# Patient Record
Sex: Female | Born: 1975
Health system: Southern US, Community
[De-identification: ages and names within clinical notes are randomized; demographics above are authoritative.]

## PROBLEM LIST (undated history)

## (undated) DIAGNOSIS — Z9889 Other specified postprocedural states: Secondary | ICD-10-CM

## (undated) DIAGNOSIS — T4145XA Adverse effect of unspecified anesthetic, initial encounter: Secondary | ICD-10-CM

## (undated) DIAGNOSIS — C50919 Malignant neoplasm of unspecified site of unspecified female breast: Secondary | ICD-10-CM

## (undated) DIAGNOSIS — R112 Nausea with vomiting, unspecified: Secondary | ICD-10-CM

## (undated) DIAGNOSIS — C50911 Malignant neoplasm of unspecified site of right female breast: Secondary | ICD-10-CM

## (undated) DIAGNOSIS — T8859XA Other complications of anesthesia, initial encounter: Secondary | ICD-10-CM

## (undated) DIAGNOSIS — Z9221 Personal history of antineoplastic chemotherapy: Secondary | ICD-10-CM

## (undated) DIAGNOSIS — M170 Bilateral primary osteoarthritis of knee: Secondary | ICD-10-CM

## (undated) DIAGNOSIS — Z1589 Genetic susceptibility to other disease: Secondary | ICD-10-CM

## (undated) DIAGNOSIS — Z8709 Personal history of other diseases of the respiratory system: Secondary | ICD-10-CM

## (undated) DIAGNOSIS — Z1509 Genetic susceptibility to other malignant neoplasm: Secondary | ICD-10-CM

## (undated) DIAGNOSIS — Z803 Family history of malignant neoplasm of breast: Secondary | ICD-10-CM

## (undated) DIAGNOSIS — Z1502 Genetic susceptibility to malignant neoplasm of ovary: Secondary | ICD-10-CM

## (undated) HISTORY — PX: TONSILECTOMY/ADENOIDECTOMY WITH MYRINGOTOMY: SHX6125

## (undated) HISTORY — DX: Family history of malignant neoplasm of breast: Z80.3

## (undated) HISTORY — DX: Genetic susceptibility to malignant neoplasm of ovary: Z15.02

## (undated) HISTORY — DX: Genetic susceptibility to other malignant neoplasm: Z15.09

## (undated) HISTORY — DX: Malignant neoplasm of unspecified site of unspecified female breast: C50.919

## (undated) HISTORY — PX: TUBAL LIGATION: SHX77

## (undated) HISTORY — PX: KNEE RECONSTRUCTION: SHX5883

## (undated) HISTORY — PX: TONSILLECTOMY: SUR1361

## (undated) HISTORY — DX: Genetic susceptibility to other disease: Z15.89

---

## 1998-12-25 ENCOUNTER — Ambulatory Visit (HOSPITAL_COMMUNITY): Admission: RE | Admit: 1998-12-25 | Discharge: 1998-12-25 | Payer: Self-pay | Admitting: *Deleted

## 1999-07-11 ENCOUNTER — Other Ambulatory Visit: Admission: RE | Admit: 1999-07-11 | Discharge: 1999-07-11 | Payer: Self-pay | Admitting: Obstetrics & Gynecology

## 2000-01-09 ENCOUNTER — Ambulatory Visit (HOSPITAL_COMMUNITY): Admission: RE | Admit: 2000-01-09 | Discharge: 2000-01-09 | Payer: Self-pay | Admitting: Obstetrics and Gynecology

## 2000-01-09 ENCOUNTER — Encounter: Payer: Self-pay | Admitting: Obstetrics and Gynecology

## 2000-02-27 ENCOUNTER — Inpatient Hospital Stay (HOSPITAL_COMMUNITY): Admission: AD | Admit: 2000-02-27 | Discharge: 2000-03-01 | Payer: Self-pay | Admitting: Obstetrics & Gynecology

## 2000-03-02 ENCOUNTER — Encounter: Admission: RE | Admit: 2000-03-02 | Discharge: 2000-05-31 | Payer: Self-pay | Admitting: Obstetrics and Gynecology

## 2000-06-02 ENCOUNTER — Encounter: Admission: RE | Admit: 2000-06-02 | Discharge: 2000-08-31 | Payer: Self-pay | Admitting: Obstetrics and Gynecology

## 2000-09-02 ENCOUNTER — Encounter: Admission: RE | Admit: 2000-09-02 | Discharge: 2000-10-31 | Payer: Self-pay | Admitting: Obstetrics and Gynecology

## 2000-11-30 ENCOUNTER — Encounter: Admission: RE | Admit: 2000-11-30 | Discharge: 2000-12-30 | Payer: Self-pay | Admitting: Obstetrics and Gynecology

## 2000-12-31 ENCOUNTER — Encounter: Admission: RE | Admit: 2000-12-31 | Discharge: 2001-01-30 | Payer: Self-pay | Admitting: Obstetrics and Gynecology

## 2001-07-26 ENCOUNTER — Other Ambulatory Visit: Admission: RE | Admit: 2001-07-26 | Discharge: 2001-07-26 | Payer: Self-pay | Admitting: Obstetrics and Gynecology

## 2001-11-26 ENCOUNTER — Inpatient Hospital Stay (HOSPITAL_COMMUNITY): Admission: RE | Admit: 2001-11-26 | Discharge: 2001-11-26 | Payer: Self-pay | Admitting: Obstetrics & Gynecology

## 2001-11-27 ENCOUNTER — Inpatient Hospital Stay (HOSPITAL_COMMUNITY): Admission: RE | Admit: 2001-11-27 | Discharge: 2001-11-27 | Payer: Self-pay | Admitting: Obstetrics & Gynecology

## 2002-06-22 ENCOUNTER — Other Ambulatory Visit: Admission: RE | Admit: 2002-06-22 | Discharge: 2002-06-22 | Payer: Self-pay | Admitting: Obstetrics and Gynecology

## 2002-10-12 ENCOUNTER — Inpatient Hospital Stay (HOSPITAL_COMMUNITY): Admission: AD | Admit: 2002-10-12 | Discharge: 2002-10-15 | Payer: Self-pay | Admitting: Obstetrics and Gynecology

## 2003-01-11 ENCOUNTER — Inpatient Hospital Stay (HOSPITAL_COMMUNITY): Admission: AD | Admit: 2003-01-11 | Discharge: 2003-01-14 | Payer: Self-pay | Admitting: Obstetrics and Gynecology

## 2003-01-15 ENCOUNTER — Encounter: Admission: RE | Admit: 2003-01-15 | Discharge: 2003-02-14 | Payer: Self-pay | Admitting: Obstetrics and Gynecology

## 2003-03-17 ENCOUNTER — Encounter: Admission: RE | Admit: 2003-03-17 | Discharge: 2003-04-16 | Payer: Self-pay | Admitting: Obstetrics and Gynecology

## 2003-04-17 ENCOUNTER — Encounter: Admission: RE | Admit: 2003-04-17 | Discharge: 2003-05-17 | Payer: Self-pay | Admitting: Obstetrics and Gynecology

## 2003-06-17 ENCOUNTER — Encounter: Admission: RE | Admit: 2003-06-17 | Discharge: 2003-07-17 | Payer: Self-pay | Admitting: Obstetrics and Gynecology

## 2003-08-17 ENCOUNTER — Encounter: Admission: RE | Admit: 2003-08-17 | Discharge: 2003-09-16 | Payer: Self-pay | Admitting: Obstetrics and Gynecology

## 2003-09-17 ENCOUNTER — Encounter: Admission: RE | Admit: 2003-09-17 | Discharge: 2003-10-17 | Payer: Self-pay | Admitting: Obstetrics and Gynecology

## 2003-11-15 ENCOUNTER — Encounter: Admission: RE | Admit: 2003-11-15 | Discharge: 2003-12-15 | Payer: Self-pay | Admitting: Obstetrics and Gynecology

## 2004-03-20 ENCOUNTER — Other Ambulatory Visit: Admission: RE | Admit: 2004-03-20 | Discharge: 2004-03-20 | Payer: Self-pay | Admitting: Obstetrics and Gynecology

## 2004-12-26 ENCOUNTER — Encounter (INDEPENDENT_AMBULATORY_CARE_PROVIDER_SITE_OTHER): Payer: Self-pay | Admitting: Specialist

## 2004-12-26 ENCOUNTER — Inpatient Hospital Stay (HOSPITAL_COMMUNITY): Admission: AD | Admit: 2004-12-26 | Discharge: 2004-12-28 | Payer: Self-pay | Admitting: Obstetrics and Gynecology

## 2004-12-29 ENCOUNTER — Encounter: Admission: RE | Admit: 2004-12-29 | Discharge: 2005-01-28 | Payer: Self-pay | Admitting: Obstetrics and Gynecology

## 2005-02-28 ENCOUNTER — Encounter: Admission: RE | Admit: 2005-02-28 | Discharge: 2005-03-30 | Payer: Self-pay | Admitting: Obstetrics and Gynecology

## 2005-03-31 ENCOUNTER — Encounter: Admission: RE | Admit: 2005-03-31 | Discharge: 2005-04-29 | Payer: Self-pay | Admitting: Obstetrics and Gynecology

## 2005-04-30 ENCOUNTER — Encounter: Admission: RE | Admit: 2005-04-30 | Discharge: 2005-05-30 | Payer: Self-pay | Admitting: Obstetrics and Gynecology

## 2005-05-31 ENCOUNTER — Encounter: Admission: RE | Admit: 2005-05-31 | Discharge: 2005-06-29 | Payer: Self-pay | Admitting: Obstetrics and Gynecology

## 2005-06-30 ENCOUNTER — Encounter: Admission: RE | Admit: 2005-06-30 | Discharge: 2005-07-30 | Payer: Self-pay | Admitting: Obstetrics and Gynecology

## 2005-07-31 ENCOUNTER — Encounter: Admission: RE | Admit: 2005-07-31 | Discharge: 2005-08-30 | Payer: Self-pay | Admitting: Obstetrics and Gynecology

## 2005-08-31 ENCOUNTER — Encounter: Admission: RE | Admit: 2005-08-31 | Discharge: 2005-09-27 | Payer: Self-pay | Admitting: Obstetrics and Gynecology

## 2005-09-28 ENCOUNTER — Encounter: Admission: RE | Admit: 2005-09-28 | Discharge: 2005-10-28 | Payer: Self-pay | Admitting: Obstetrics and Gynecology

## 2005-10-29 ENCOUNTER — Encounter: Admission: RE | Admit: 2005-10-29 | Discharge: 2005-11-27 | Payer: Self-pay | Admitting: Obstetrics and Gynecology

## 2005-11-18 ENCOUNTER — Encounter: Payer: Self-pay | Admitting: Obstetrics and Gynecology

## 2005-11-28 ENCOUNTER — Encounter: Admission: RE | Admit: 2005-11-28 | Discharge: 2005-12-28 | Payer: Self-pay | Admitting: Obstetrics and Gynecology

## 2005-12-29 ENCOUNTER — Encounter: Admission: RE | Admit: 2005-12-29 | Discharge: 2006-01-27 | Payer: Self-pay | Admitting: Obstetrics and Gynecology

## 2006-01-28 ENCOUNTER — Encounter: Admission: RE | Admit: 2006-01-28 | Discharge: 2006-02-05 | Payer: Self-pay | Admitting: Obstetrics and Gynecology

## 2009-01-29 ENCOUNTER — Ambulatory Visit (HOSPITAL_COMMUNITY): Admission: RE | Admit: 2009-01-29 | Discharge: 2009-01-29 | Payer: Self-pay | Admitting: Obstetrics and Gynecology

## 2009-02-22 ENCOUNTER — Inpatient Hospital Stay (HOSPITAL_COMMUNITY): Admission: AD | Admit: 2009-02-22 | Discharge: 2009-02-26 | Payer: Self-pay | Admitting: Obstetrics and Gynecology

## 2009-02-23 ENCOUNTER — Encounter (INDEPENDENT_AMBULATORY_CARE_PROVIDER_SITE_OTHER): Payer: Self-pay | Admitting: Obstetrics and Gynecology

## 2009-02-27 ENCOUNTER — Encounter: Admission: RE | Admit: 2009-02-27 | Discharge: 2009-03-29 | Payer: Self-pay | Admitting: Obstetrics and Gynecology

## 2009-03-07 ENCOUNTER — Ambulatory Visit (HOSPITAL_COMMUNITY): Admission: RE | Admit: 2009-03-07 | Discharge: 2009-03-07 | Payer: Self-pay | Admitting: Obstetrics and Gynecology

## 2009-03-30 ENCOUNTER — Encounter: Admission: RE | Admit: 2009-03-30 | Discharge: 2009-04-28 | Payer: Self-pay | Admitting: Obstetrics and Gynecology

## 2009-04-29 ENCOUNTER — Encounter: Admission: RE | Admit: 2009-04-29 | Discharge: 2009-05-29 | Payer: Self-pay | Admitting: Obstetrics and Gynecology

## 2009-05-30 ENCOUNTER — Encounter: Admission: RE | Admit: 2009-05-30 | Discharge: 2009-06-28 | Payer: Self-pay | Admitting: Obstetrics and Gynecology

## 2009-06-29 ENCOUNTER — Encounter: Admission: RE | Admit: 2009-06-29 | Discharge: 2009-07-18 | Payer: Self-pay | Admitting: Obstetrics and Gynecology

## 2009-07-30 ENCOUNTER — Encounter: Admission: RE | Admit: 2009-07-30 | Discharge: 2009-08-29 | Payer: Self-pay | Admitting: Obstetrics and Gynecology

## 2009-08-30 ENCOUNTER — Encounter: Admission: RE | Admit: 2009-08-30 | Discharge: 2009-09-29 | Payer: Self-pay | Admitting: Obstetrics and Gynecology

## 2009-10-28 ENCOUNTER — Encounter: Admission: RE | Admit: 2009-10-28 | Discharge: 2009-11-27 | Payer: Self-pay | Admitting: Obstetrics and Gynecology

## 2009-11-28 ENCOUNTER — Encounter: Admission: RE | Admit: 2009-11-28 | Discharge: 2009-12-28 | Payer: Self-pay | Admitting: Pediatrics

## 2009-12-29 ENCOUNTER — Encounter: Admission: RE | Admit: 2009-12-29 | Discharge: 2010-01-13 | Payer: Self-pay | Admitting: Obstetrics and Gynecology

## 2010-08-11 ENCOUNTER — Encounter: Payer: Self-pay | Admitting: Obstetrics and Gynecology

## 2010-10-25 LAB — CBC
HCT: 30.1 % — ABNORMAL LOW (ref 36.0–46.0)
MCHC: 33.8 g/dL (ref 30.0–36.0)
MCHC: 34.3 g/dL (ref 30.0–36.0)
MCV: 84.8 fL (ref 78.0–100.0)
Platelets: 208 10*3/uL (ref 150–400)
Platelets: 241 10*3/uL (ref 150–400)
RBC: 2.59 MIL/uL — ABNORMAL LOW (ref 3.87–5.11)
RDW: 15.6 % — ABNORMAL HIGH (ref 11.5–15.5)
RDW: 16 % — ABNORMAL HIGH (ref 11.5–15.5)
WBC: 7.5 10*3/uL (ref 4.0–10.5)

## 2010-10-25 LAB — DIFFERENTIAL
Lymphs Abs: 2.8 10*3/uL (ref 0.7–4.0)
Monocytes Relative: 6 % (ref 3–12)
Neutro Abs: 3.7 10*3/uL (ref 1.7–7.7)
Neutrophils Relative %: 50 % (ref 43–77)

## 2010-12-02 NOTE — Op Note (Signed)
Robin Miles, Robin Miles                   ACCOUNT NO.:  [id number]   MEDICAL RECORD NO.:  [id number]          PATIENT TYPE:  OUT   LOCATION:  MFM                           [facility]:  [facility]   PHYSICIAN:  Malva Limes, M.D.    DATE OF BIRTH:  [birth year]   DATE OF PROCEDURE:  02/24/2009  DATE OF DISCHARGE:                               OPERATIVE REPORT   PREOPERATIVE DIAGNOSIS:  The patient desires permanent sterilization.   POSTOPERATIVE DIAGNOSIS:  The patient desires permanent sterilization.   PROCEDURE:  Postpartum bilateral tubal ligation, Pomeroy procedure.   SURGEON:  Malva Limes, MD   ANESTHESIA:  Epidural and local.   ANTIBIOTICS:  Ancef 1g.   DRAINS:  Red rubber catheter into the bladder.   SPECIMENS:  Portion of right and left fallopian tube sent to Pathology.   COMPLICATIONS:  None.   ESTIMATED BLOOD LOSS:  Minimal.   DESCRIPTION OF PROCEDURE:  The patient was taken to the operating room,  where her epidural anesthetic was reinjected.  Once an adequate level  was reached, the patient was prepped and draped in the usual fashion for  this procedure.  Her bladder was drained with a red rubber catheter.  Her umbilicus was then injected with 0.25% bupivacaine.  A 2-cm  infraumbilical incision was made.  The fascia was grasped and entered  with Mayo scissors.  Parietal peritoneum was entered sharply.  Army-Navy  retractors were then placed.  The patient was placed in Trendelenburg.  The left fallopian tube was then grasped with a Babcock and carried to  the fimbriated end for identification.  Once this was accomplished, a 2-  cm knuckle in the isthmic portion of fallopian tube was doubly ligated  with plain gut suture.  The knuckle was excised.  Hemostasis appeared to  be adequate.  Both ostia were visualized.  A similar procedure was  performed on the opposite side.  At this point, the fascia and parietal  peritoneum were closed using 0 Monocryl suture in a running  fashion.  The skin was closed using 3-0 Vicryl in a subcuticular fashion.  The  patient tolerated the procedure well.  She was taken to recovery room in  stable condition.  Instrument and lap counts correct x1.           ______________________________  Malva Limes, M.D.    MA/MEDQ  D:  02/24/2009  T:  02/24/2009  Job:  [id number]

## 2010-12-02 NOTE — Discharge Summary (Signed)
Robin Miles, Robin Miles                   ACCOUNT NO.:  [id number]   MEDICAL RECORD NO.:  [id number]          PATIENT TYPE:  INP   LOCATION:  9139                          [facility]  [facility]   PHYSICIAN:  Leilani Able, P.A.-C.DATE OF BIRTH:  Jul 30, 1975   DATE OF ADMISSION:  02/22/2009  DATE OF DISCHARGE:  02/26/2009                               DISCHARGE SUMMARY   FINAL DIAGNOSES:  1. Twin gestation at 35-6/7th weeks.  2. Active labor.  3. Spontaneous vaginal delivery of twin A.  4. Vacuum extracted assisted delivery of twin B.   PROCEDURES:  Delivery performed by Dr. Malva Limes and postpartum  tubal sterilization also performed by Dr. Malva Limes.   COMPLICATIONS:  None.   HOSPITAL COURSE:  This 35 year old G4, P 1-2-0-4 presents with a known  twin pregnancy at 35-6/7th weeks in active labor.  The patient has had a  history of preterm labor.  Has been on 17-hydroxyprogesterone for this  pregnancy, but otherwise has not had any complications.  The patient  also desires postpartum tubal sterilization.  The patient had a slow  progression of labor.  Pitocin was used to help augment and amniotomy  was performed.  The patient was taken to the OR for delivery.  She had  spontaneous vaginal delivery of twin A, a 6 pounds 1 ounce female infant  with Apgars of 9 and 9.  This was her first degree midline tear.  Twin B  had amniotomy with clear fluids, pushed to about +2 station.  Fetal  heart tones dropped to the 110 mark.  At this point, a vacuum extractor  was placed and the patient delivered a 5 pounds 15 ounces female infant  with Apgars of 9 and 9.  The deliveries went without complications.  The  patient still expressed her desires for permanent sterilization.  She  was taken to the operating room on February 24, 2009, by Dr. Malva Limes  where postpartum bilateral tubal ligation was performed using the  Pomeroy procedure.  The procedure went without complications.  The  patient's  postoperative course was complicated by some postoperative  anemia.  The patient was felt ready for discharge on postpartum day #3,  which was postoperative day #2.  She was sent home on a regular diet,  told to decrease activities, told to continue her prenatal vitamins and  her iron supplement as directed, was told she could use ibuprofen up to  600 mg every 6 hours as needed for pain, and was to follow up in our  office in 4 weeks.  Instructions and precautions were reviewed with the  patient.   LABS ON DISCHARGE:  The patient had a hemoglobin of 7.6, white blood  cell count of 7.5, and platelets of 112,000.      Leilani Able, P.A.-C.     MB/MEDQ  D:  03/13/2009  T:  03/14/2009  Job:  [id number]

## 2011-08-13 ENCOUNTER — Other Ambulatory Visit: Payer: Self-pay | Admitting: Obstetrics and Gynecology

## 2012-02-17 ENCOUNTER — Other Ambulatory Visit: Payer: Self-pay

## 2012-11-20 ENCOUNTER — Emergency Department (HOSPITAL_COMMUNITY): Admission: EM | Admit: 2012-11-20 | Discharge: 2012-11-20 | Discharge status: Absent without leave | Payer: Self-pay

## 2014-07-20 DIAGNOSIS — C50919 Malignant neoplasm of unspecified site of unspecified female breast: Secondary | ICD-10-CM

## 2014-07-20 HISTORY — DX: Malignant neoplasm of unspecified site of unspecified female breast: C50.919

## 2014-10-25 ENCOUNTER — Other Ambulatory Visit: Payer: Self-pay | Admitting: Obstetrics and Gynecology

## 2014-10-26 ENCOUNTER — Other Ambulatory Visit: Payer: Self-pay | Admitting: Obstetrics and Gynecology

## 2014-10-26 DIAGNOSIS — R928 Other abnormal and inconclusive findings on diagnostic imaging of breast: Secondary | ICD-10-CM

## 2014-10-26 LAB — CYTOLOGY - PAP

## 2014-11-08 ENCOUNTER — Ambulatory Visit
Admission: RE | Admit: 2014-11-08 | Discharge: 2014-11-08 | Disposition: A | Discharge status: Home / Self Care | Payer: BLUE CROSS/BLUE SHIELD | Source: Ambulatory Visit | Attending: Obstetrics and Gynecology | Admitting: Obstetrics and Gynecology

## 2014-11-08 ENCOUNTER — Other Ambulatory Visit: Payer: Self-pay | Admitting: Obstetrics and Gynecology

## 2014-11-08 DIAGNOSIS — R928 Other abnormal and inconclusive findings on diagnostic imaging of breast: Secondary | ICD-10-CM

## 2014-11-13 ENCOUNTER — Other Ambulatory Visit: Payer: Self-pay | Admitting: Obstetrics and Gynecology

## 2014-11-13 DIAGNOSIS — R928 Other abnormal and inconclusive findings on diagnostic imaging of breast: Secondary | ICD-10-CM

## 2014-11-14 ENCOUNTER — Ambulatory Visit
Admission: RE | Admit: 2014-11-14 | Discharge: 2014-11-14 | Disposition: A | Discharge status: Home / Self Care | Payer: BLUE CROSS/BLUE SHIELD | Source: Ambulatory Visit | Attending: Obstetrics and Gynecology | Admitting: Obstetrics and Gynecology

## 2014-11-14 DIAGNOSIS — R928 Other abnormal and inconclusive findings on diagnostic imaging of breast: Secondary | ICD-10-CM

## 2014-11-16 ENCOUNTER — Other Ambulatory Visit: Payer: Self-pay | Admitting: Obstetrics and Gynecology

## 2014-11-16 DIAGNOSIS — D0511 Intraductal carcinoma in situ of right breast: Secondary | ICD-10-CM

## 2014-11-16 DIAGNOSIS — C50911 Malignant neoplasm of unspecified site of right female breast: Secondary | ICD-10-CM

## 2014-11-20 ENCOUNTER — Ambulatory Visit
Admission: RE | Admit: 2014-11-20 | Discharge: 2014-11-20 | Disposition: A | Discharge status: Home / Self Care | Payer: BLUE CROSS/BLUE SHIELD | Source: Ambulatory Visit | Attending: Obstetrics and Gynecology | Admitting: Obstetrics and Gynecology

## 2014-11-20 DIAGNOSIS — D0511 Intraductal carcinoma in situ of right breast: Secondary | ICD-10-CM

## 2014-11-20 DIAGNOSIS — C50911 Malignant neoplasm of unspecified site of right female breast: Secondary | ICD-10-CM

## 2014-11-20 MED ORDER — GADOBENATE DIMEGLUMINE 529 MG/ML IV SOLN: 9.0000 mL | Freq: Once | INTRAVENOUS | Status: AC | PRN

## 2014-11-23 ENCOUNTER — Telehealth: Payer: Self-pay | Admitting: *Deleted

## 2014-11-23 NOTE — Telephone Encounter (Signed)
Spoke with patient and confirmed new patient appointment for 345 for fin and 415 with Dr. Lindi Adie.  She is scheduled for genetics on 5/16 a t2pm.  She will pick up her packet when she checks in on Monday. Notified [facility] of her appointments. Contact information given and encouraged her to call with any needs or concerns.

## 2014-11-26 ENCOUNTER — Encounter: Payer: Self-pay | Admitting: Hematology and Oncology

## 2014-11-26 ENCOUNTER — Ambulatory Visit (HOSPITAL_BASED_OUTPATIENT_CLINIC_OR_DEPARTMENT_OTHER): Payer: BLUE CROSS/BLUE SHIELD | Admitting: Hematology and Oncology

## 2014-11-26 ENCOUNTER — Ambulatory Visit: Payer: BLUE CROSS/BLUE SHIELD

## 2014-11-26 ENCOUNTER — Other Ambulatory Visit: Payer: Self-pay | Admitting: *Deleted

## 2014-11-26 VITALS — BP 112/65 | HR 66 | Temp 97.8°F | Resp 18 | Ht 63.0 in | Wt 113.6 lb

## 2014-11-26 DIAGNOSIS — Z8041 Family history of malignant neoplasm of ovary: Secondary | ICD-10-CM

## 2014-11-26 DIAGNOSIS — C50111 Malignant neoplasm of central portion of right female breast: Secondary | ICD-10-CM | POA: Insufficient documentation

## 2014-11-26 DIAGNOSIS — Z803 Family history of malignant neoplasm of breast: Secondary | ICD-10-CM

## 2014-11-26 DIAGNOSIS — C50511 Malignant neoplasm of lower-outer quadrant of right female breast: Secondary | ICD-10-CM

## 2014-11-26 NOTE — Progress Notes (Signed)
[facility] CONSULT NOTE  Patient Care Team: Olga Millers, MD as PCP - General (Obstetrics and Gynecology)  CHIEF COMPLAINTS/PURPOSE OF CONSULTATION:  Newly diagnosed breast cancer  HISTORY OF PRESENTING ILLNESS:  Robin Miles 39 y.o. female is here because of recent diagnosis of  Right breast cancer. Patient had a routine screening mammogram that revealed abnormalities in the right breast at the 2 biopsy which revealed invasive ductal carcinoma with DCIS grade 2 that was ER/PR 0% HER-2 positive. She had a breast MRI that showed a 3.7 cm  Area of non-mass enhancement in the retroareolar position of the right breast extending towards the nipple. There were no enlarged lymph nodes. She was sent to [place] for discussion regarding neoadjuvant treatment options. 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 She denies any pain discomfort or any abnormalities of the breast.  She is extremely nervous and worried about her diagnosis.  I reviewed her records extensively and collaborated the history with the patient.  SUMMARY OF ONCOLOGIC HISTORY:   Cancer of central portion of right female breast   11/14/2014 Initial Diagnosis Right breast biopsy 8:30 position: Invasive ductal carcinoma with DCIS, grade 2, ER 0%, PR 0%, HER-2 positive ratio 3.53   11/20/2014 Breast MRI Right breast: 3.7 x 2.8 x 1.7 cm area of non-mass enhancement and retroareolar right breast into the lower outer quadrant and lower inner quadrant extending into the nipple involving anterior two thirds of the breast, no lymph nodes    In terms of breast cancer risk profile:  She menarched at early age of 53 She has 6 children ( 2 sets of twins) She was exposed to fertility medications or hormone replacement therapy.  She has family history of Breast/GYN/GI cancer  First cousin breast cancer, grandmother breast cancer 38, another cousin with ovarian cancer  MEDICAL HISTORY: No previous  medical illnesses  SURGICAL HISTORY: No surgeries SOCIAL HISTORY:denies tobacco, alcohol or recreational drugs Works in [employer] FAMILY HISTORY: GM breast cancer 51, cousin ovarian cancer, another cousin breast cancer 25  ALLERGIES:  has No Known Allergies.  MEDICATIONS: No meds REVIEW OF SYSTEMS:   Constitutional: Denies fevers, chills or abnormal night sweats Eyes: Denies blurriness of vision, double vision or watery eyes Ears, nose, mouth, throat, and face: Denies mucositis or sore throat Respiratory: Denies cough, dyspnea or wheezes Cardiovascular: Denies palpitation, chest discomfort or lower extremity swelling Gastrointestinal:  Denies nausea, heartburn or change in bowel habits Skin: Denies abnormal skin rashes Lymphatics: Denies new lymphadenopathy or easy bruising Neurological:Denies numbness, tingling or new weaknesses Behavioral/Psych: Mood is stable, no new changes  Breast:  Denies any palpable lumps or discharge All other systems were reviewed with the patient and are negative.  PHYSICAL EXAMINATION: ECOG PERFORMANCE STATUS: 0 - Asymptomatic  Filed Vitals:   11/26/14 1547  BP: 112/65  Pulse: 66  Temp: 97.8 F (36.6 C)  Resp: 18   Filed Weights   11/26/14 1547  Weight: 113 lb 9.6 oz (51.529 kg)    GENERAL:alert, no distress and comfortable SKIN: skin color, texture, turgor are normal, no rashes or significant lesions EYES: normal, conjunctiva are pink and non-injected, sclera clear OROPHARYNX:no exudate, no erythema and lips, buccal mucosa, and tongue normal  NECK: supple, thyroid normal size, non-tender, without nodularity LYMPH:  no palpable lymphadenopathy in the cervical, axillary or inguinal LUNGS: clear to auscultation and percussion with normal breathing effort HEART: regular rate & rhythm and no  murmurs and no lower extremity edema ABDOMEN:abdomen soft, non-tender and normal bowel sounds Musculoskeletal:no cyanosis of digits and no  clubbing  PSYCH: alert & oriented x 3 with fluent speech NEURO: no focal motor/sensory deficits BREAST: No palpable nodules in breast. No palpable axillary or supraclavicular lymphadenopathy (exam performed in the presence of a chaperone)   LABORATORY DATA:  I have reviewed the data as listed Lab Results  Component Value Date   WBC 7.5 02/26/2009   HGB * 02/26/2009    7.6 CRITICAL RESULT CALLED TO, READ BACK BY AND VERIFIED WITH: KIRKPATRICK,B ON 02/26/09 AT 0603 BY GARCONJ   HCT 22.0* 02/26/2009   MCV 84.8 02/26/2009   PLT 212 02/26/2009   No results found for: NA, K, CL, CO2  RADIOGRAPHIC STUDIES: I have personally reviewed the radiological reports and agreed with the findings in the report.  results are summarized as above  ASSESSMENT AND PLAN:  Cancer of central portion of right female breast Right breast retroareolar mass 3.7 x 2.8 x 1.7 cm involving anterior two thirds of the breast, T2 N0 M0 stage II a clinical stage, ER 0%, appears 0%, HER-2 positive ratio 3.53  Radiology and pathology review: Discussed with the patient, the details of pathology including the type of breast cancer,the clinical staging, the significance of ER, PR and HER-2/neu receptors and the implications for treatment. After reviewing the pathology in detail, we proceeded to discuss the different treatment options between surgery, radiation, chemotherapy, antiestrogen therapies.  Recommendation: 1. Neoadjuvant chemotherapy with [place] Perjeta 2. consultation with genetics 3.  followed by surgery and followed by adjuvant radiation if she chooses to undergo lumpectomy   Chemotherapy counseling:I have discussed the risks and benefits of chemotherapy including the risks of nausea/ vomiting, risk of infection from low WBC count, fatigue due to chemo or anemia, bruising or bleeding due to low platelets, mouth sores, loss/ change in taste and decreased appetite. Liver and kidney function will be monitored through out  chemotherapy as abnormalities in liver and kidney function may be a side effect of treatment. Cardiac dysfunction due to Herceptin and Perjeta were discussed in detail. Risk of permanent bone marrow dysfunction and leukemia due to chemo were also discussed.  Plan:  1. Port placement  2. Echocardiogram  3. Chemotherapy education  4. Start chemotherapy in 12/06/14  All questions were answered. The patient knows to call the clinic with any problems, questions or concerns.    Rulon Eisenmenger, MD 5:25 PM

## 2014-11-26 NOTE — Progress Notes (Signed)
Checked in new pt with no financial concerns prior to seeing the dr.  Informed pt if chemo is part of her treatment we will call her ins to see if Josem Kaufmann is req and will obtain it if it is as well as contact foundations that offer copay assistance for chemo if needed.  She has my card for any billing questions or concerns.

## 2014-11-26 NOTE — Assessment & Plan Note (Signed)
Right breast retroareolar mass 3.7 x 2.8 x 1.7 cm involving anterior two thirds of the breast, T2 N0 M0 stage II a clinical stage, ER 0%, appears 0%, HER-2 positive ratio 3.53  Radiology and pathology review: Discussed with the patient, the details of pathology including the type of breast cancer,the clinical staging, the significance of ER, PR and HER-2/neu receptors and the implications for treatment. After reviewing the pathology in detail, we proceeded to discuss the different treatment options between surgery, radiation, chemotherapy, antiestrogen therapies.  Recommendation: 1. Neoadjuvant chemotherapy with [place] Perjeta 2. consultation with genetics 3.  followed by surgery and followed by adjuvant radiation if she chooses to undergo lumpectomy   Chemotherapy counseling:I have discussed the risks and benefits of chemotherapy including the risks of nausea/ vomiting, risk of infection from low WBC count, fatigue due to chemo or anemia, bruising or bleeding due to low platelets, mouth sores, loss/ change in taste and decreased appetite. Liver and kidney function will be monitored through out chemotherapy as abnormalities in liver and kidney function may be a side effect of treatment. Cardiac dysfunction due to Herceptin and Perjeta were discussed in detail. Risk of permanent bone marrow dysfunction and leukemia due to chemo were also discussed.  Plan:  1. Port placement  2. Echocardiogram  3. Chemotherapy education  4. Start chemotherapy in 1-2 weeks

## 2014-11-27 ENCOUNTER — Encounter: Payer: Self-pay | Admitting: *Deleted

## 2014-11-27 ENCOUNTER — Telehealth: Payer: Self-pay | Admitting: Hematology and Oncology

## 2014-11-27 ENCOUNTER — Telehealth: Payer: Self-pay | Admitting: *Deleted

## 2014-11-27 ENCOUNTER — Encounter: Payer: Self-pay | Admitting: Radiation Oncology

## 2014-11-27 NOTE — Progress Notes (Addendum)
Location of Breast Cancer: right breast cancer at subareolar 8:30 position  Histology per Pathology Report:  11/14/14 Diagnosis Breast, right, needle core biopsy, subareolar 8:30 o'clock - INVASIVE DUCTAL CARCINOMA. - DUCTAL CARCINOMA IN SITU. - ASSOCIATED CALCIFICATION. - SEE COMMENT.  Receptor Status: ER(0%), PR (0%), Her2-neu (positive)  Did patient present with symptoms (if so, please note symptoms) or was this found on screening mammography?: routine mammogram  Past/Anticipated interventions by surgeon, if any: Will see Dr. Excell Seltzer tomorrow for a port a chat insertion.  Will have surgery after chemotherapy - most likely in September.  Past/Anticipated interventions by medical oncology, if any: Neoadjuvant chemotherapy with [place] to start 12/04/14  Lymphedema issues, if any:  no  Pain issues, if any:  no    OB GYN history: She menarched at 10. She has 6 children ( 2 sets of twins). She was exposed to fertility medications or hormone replacement therapy. She has family history of Breast/GYN/GI cancer First cousin breast cancer, grandmother breast cancer 81, another cousin with ovarian cancer   SAFETY ISSUES:  Prior radiation? no  Pacemaker/ICD? no  Possible current pregnancy?no  Is the patient on methotrexate? no  Current Complaints / other details:  Patient is here with her husband.  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  Patient is leaning towards having a mastectomy.  BP 110/66 mmHg  Pulse 68  Temp(Src) 98.2 F (36.8 C) (Oral)  Resp 16  Ht 5' 2.5" (1.588 m)  Wt 112 lb 4.8 oz (50.939 kg)  BMI 20.20 kg/m2  LMP 11/09/2014

## 2014-11-27 NOTE — Telephone Encounter (Signed)
spoke with patient and she is aware of all appointments

## 2014-11-27 NOTE — Telephone Encounter (Signed)
Per staff message and POF I have scheduled appts. Advised scheduler of appts. JMW  

## 2014-11-27 NOTE — Addendum Note (Signed)
Addended by: Renford Dills on: 11/27/2014 07:28 AM   Modules accepted: Medications

## 2014-11-28 ENCOUNTER — Encounter (HOSPITAL_BASED_OUTPATIENT_CLINIC_OR_DEPARTMENT_OTHER): Payer: Self-pay | Admitting: *Deleted

## 2014-11-29 ENCOUNTER — Ambulatory Visit
Admission: RE | Admit: 2014-11-29 | Discharge: 2014-11-29 | Disposition: A | Discharge status: Home / Self Care | Payer: BLUE CROSS/BLUE SHIELD | Source: Ambulatory Visit | Attending: Radiation Oncology | Admitting: Radiation Oncology

## 2014-11-29 ENCOUNTER — Encounter: Payer: Self-pay | Admitting: *Deleted

## 2014-11-29 ENCOUNTER — Other Ambulatory Visit: Payer: BLUE CROSS/BLUE SHIELD

## 2014-11-29 ENCOUNTER — Encounter: Payer: Self-pay | Admitting: Genetic Counselor

## 2014-11-29 ENCOUNTER — Ambulatory Visit: Payer: BLUE CROSS/BLUE SHIELD

## 2014-11-29 ENCOUNTER — Ambulatory Visit (HOSPITAL_BASED_OUTPATIENT_CLINIC_OR_DEPARTMENT_OTHER): Payer: BLUE CROSS/BLUE SHIELD | Admitting: Genetic Counselor

## 2014-11-29 ENCOUNTER — Encounter: Payer: Self-pay | Admitting: Radiation Oncology

## 2014-11-29 ENCOUNTER — Ambulatory Visit (HOSPITAL_COMMUNITY)
Admission: RE | Admit: 2014-11-29 | Discharge: 2014-11-29 | Disposition: A | Discharge status: Home / Self Care | Payer: BLUE CROSS/BLUE SHIELD | Source: Ambulatory Visit | Attending: Hematology and Oncology | Admitting: Hematology and Oncology

## 2014-11-29 ENCOUNTER — Other Ambulatory Visit: Payer: Self-pay | Admitting: *Deleted

## 2014-11-29 VITALS — BP 110/66 | HR 68 | Temp 98.2°F | Resp 16 | Ht 62.5 in | Wt 112.3 lb

## 2014-11-29 DIAGNOSIS — Z8041 Family history of malignant neoplasm of ovary: Secondary | ICD-10-CM | POA: Diagnosis not present

## 2014-11-29 DIAGNOSIS — Z803 Family history of malignant neoplasm of breast: Secondary | ICD-10-CM

## 2014-11-29 DIAGNOSIS — C50511 Malignant neoplasm of lower-outer quadrant of right female breast: Secondary | ICD-10-CM

## 2014-11-29 DIAGNOSIS — C50111 Malignant neoplasm of central portion of right female breast: Secondary | ICD-10-CM

## 2014-11-29 DIAGNOSIS — C50919 Malignant neoplasm of unspecified site of unspecified female breast: Secondary | ICD-10-CM | POA: Insufficient documentation

## 2014-11-29 DIAGNOSIS — Z315 Encounter for genetic counseling: Secondary | ICD-10-CM | POA: Diagnosis not present

## 2014-11-29 HISTORY — DX: Malignant neoplasm of unspecified site of right female breast: C50.911

## 2014-11-29 MED ORDER — ONDANSETRON HCL 8 MG PO TABS: 8.0000 mg | ORAL_TABLET | Freq: Two times a day (BID) | ORAL | Status: DC

## 2014-11-29 MED ORDER — LIDOCAINE-PRILOCAINE 2.5-2.5 % EX CREA: TOPICAL_CREAM | CUTANEOUS | Status: DC

## 2014-11-29 MED ORDER — PROCHLORPERAZINE MALEATE 10 MG PO TABS
10.0000 mg | ORAL_TABLET | Freq: Four times a day (QID) | ORAL | Status: DC | PRN
Start: 2014-11-29 — End: 2015-04-12

## 2014-11-29 MED ORDER — DEXAMETHASONE 4 MG PO TABS: 4.0000 mg | ORAL_TABLET | Freq: Two times a day (BID) | ORAL | Status: DC

## 2014-11-29 MED ORDER — LORAZEPAM 0.5 MG PO TABS: 0.5000 mg | ORAL_TABLET | Freq: Every day | ORAL | Status: DC

## 2014-11-29 MED ORDER — UNABLE TO FIND: 1.0000 | Freq: Once | Status: DC

## 2014-11-29 NOTE — Progress Notes (Signed)
REFERRING PROVIDER: Mark E Anderson, MD [address] [place], [place] [address]   Vinay Gudena, MD  PRIMARY PROVIDER:  ANDERSON,MARK E, MD  PRIMARY REASON FOR VISIT:  1. Cancer of central portion of right female breast   2. Family history of breast cancer      HISTORY OF PRESENT ILLNESS:   Robin Miles, a 38 y.o. female, was seen for a [place] cancer genetics consultation at the request of Dr. Anderson due to a personal and family history of cancer.  Robin Miles presents to clinic today to discuss the possibility of a hereditary predisposition to cancer, genetic testing, and to further clarify her future cancer risks, as well as potential cancer risks for family members.   In 2016, at the age of 38, Robin Miles was diagnosed with IDC and DCIS of the right breast. This will be treated with chemotherapy.  Robin Miles is scheduled for a port insertion tomorrow.  She states that she is considering having a double mastectomy, regardless of her genetic test results.    CANCER HISTORY:    Cancer of central portion of right female breast   11/14/2014 Initial Diagnosis Right breast biopsy 8:30 position: Invasive ductal carcinoma with DCIS, grade 2, ER 0%, PR 0%, HER-2 positive ratio 3.53   11/20/2014 Breast MRI Right breast: 3.7 x 2.8 x 1.7 cm area of non-mass enhancement and retroareolar right breast into the lower outer quadrant and lower inner quadrant extending into the nipple involving anterior two thirds of the breast, no lymph nodes     HORMONAL RISK FACTORS:  Menarche was at age 10.  First live birth at age 24.  OCP use for approximately 0 years.  Ovaries intact: yes.  Hysterectomy: no.  Menopausal status: premenopausal.  HRT use: 0 years. Colonoscopy: no; not examined. Mammogram within the last year: yes. Number of breast biopsies: 1. Up to date with pelvic exams:  yes. Any excessive radiation exposure in the past:  no  Past Medical History  Diagnosis Date  .  Breast cancer, right breast   . Breast cancer 2016    ER-/PR-/Her2+  . Family history of breast cancer     Past Surgical History  Procedure Laterality Date  . Tonsilectomy/adenoidectomy with myringotomy    . Tubal ligation    . Knee reconstruction Right   . Tonsillectomy      History   Social History  . Marital Status: Married    Spouse Name: N/A  . Number of Children: N/A  . Years of Education: N/A   Social History Main Topics  . Smoking status: Never Smoker   . Smokeless tobacco: Not on file  . Alcohol Use: No  . Drug Use: No  . Sexual Activity: Yes    Birth Control/ Protection: Surgical   Other Topics Concern  . None   Social History Narrative     FAMILY HISTORY:  We obtained a detailed, 4-generation family history.  Significant diagnoses are listed below: Family History  Problem Relation Age of Onset  . Breast cancer Maternal Aunt     dx in her late 60s  . Breast cancer Paternal Grandmother     dx in her 60s  . Breast cancer Cousin     dx late 40s; BRCA-  . Cancer Cousin     dx in her 40s; leiomyosarcoma   The patient has four daughters and two sons.  She also has a fraternal twin sister and a brother - all who   are cancer free.  Robin Miles mother is 13 and had a TAH, but she is unsure if a BSO was performed, in her 3s.  Her mother has 7 brothers and one sister.  Her sister had breast cancer in her 88s and had a daughter who had breast cancer in her late 70s.  Robin Miles cousin had genetic testing, and reportedly was negative for BRCA mutations.  Robin Miles has another cousin who was diagnosed and died of leimyosarcoma in her 64s.  Robin Miles father has one sister who is cancer free.  His mother had breast cancer in her 66s.  There is no other reported family history of cancer. Patient's ancestors are of Causasian descent. There is no reported Ashkenazi Jewish ancestry. There is no known consanguinity.  GENETIC COUNSELING ASSESSMENT: [name] is a 39 y.o.  female with a personal and family history of cancer which somewhat suggestive of a hereditary breast cancer syndrome and predisposition to cancer. We, therefore, discussed and recommended the following at today's visit.   DISCUSSION: We reviewed the characteristics, features and inheritance patterns of hereditary cancer syndromes. We discussed her family history of breast cancer and negative genetic testing in her cousin.  Her mother's family is very large, and there are many individuals who do not have cancer.  In light of the negative BRCA testing in her cousin, there is a reduced risk for finding a BRCA mutations, but we do not know if her cousin had panel testing of other hereditary genes, so there is a risk for those.  Robin Miles' paternal grandmother was diagnosed with breast cancer at a typical age of onset. However, there is a chance that there is a hereditary cancer syndrome being passed silently through Robin Miles father.  Therefore, genetic testing is warranted. We also discussed genetic testing, including the appropriate family members to test, the process of testing, insurance coverage and turn-around-time for results. We discussed the implications of a negative, positive and/or variant of uncertain significant result. We recommended Robin. Robin Miles pursue genetic testing for the Breast/Ovarian cancer gene panel. The Breast/Ovarian gene panel offered by [facility] includes sequencing and rearrangement analysis for the following 21 genes:  ATM, BARD1, BRCA1, BRCA2, BRIP1, CDH1, CHEK2, EPCAM, FANCC, MLH1, MSH2, MSH6, NBN, PALB2, PMS2, PTEN, RAD51C, RAD51D, STK11, TP53, and XRCC2.     Based on Robin. Blakley's personal and family history of cancer, she meets medical criteria for genetic testing. Despite that she meets criteria, she may still have an out of pocket cost. We discussed that if her out of pocket cost for testing is over $100, the laboratory will call and confirm whether she wants to proceed with testing.   If the out of pocket cost of testing is less than $100 she will be billed by the genetic testing laboratory.    PLAN: After considering the risks, benefits, and limitations, Robin. Lansdale  provided informed consent to pursue genetic testing and the blood sample was sent to [facility] for analysis of the Breast/Ovarian cancer gene panel. Results should be available within approximately 2-3 weeks' time, at which point they will be disclosed by telephone to Robin. Mukherjee, as will any additional recommendations warranted by these results. Robin. Headen will receive a summary of her genetic counseling visit and a copy of her results once available. This information will also be available in Epic. We encouraged Robin. Avis to remain in contact with cancer genetics annually so that we can continuously update the family history and inform  her of any changes in cancer genetics and testing that may be of benefit for her family. Robin. Baehr questions were answered to her satisfaction today. Our contact information was provided should additional questions or concerns arise.  Lastly, we encouraged Robin. Padovano to remain in contact with cancer genetics annually so that we can continuously update the family history and inform her of any changes in cancer genetics and testing that may be of benefit for this family.   Robin.  Skousen questions were answered to her satisfaction today. Our contact information was provided should additional questions or concerns arise. Thank you for the referral and allowing [place] to share in the care of your patient.   Karen P. Florene Glen, [place], [facility] Certified Genetic Counselor [email or web address] phone: [phone]  The patient was seen for a total of 60 minutes in face-to-face genetic counseling.  This patient was discussed with Drs. Magrinat, Lindi Adie and/or Burr Medico who agrees with the above.    _______________________________________________________________________ For Office Staff:  Number of  people involved in session: 2 Was an Intern/ student involved with case: no

## 2014-11-29 NOTE — Progress Notes (Signed)
  Echocardiogram 2D Echocardiogram has been performed.  Robin Miles 11/29/2014, 11:21 AM

## 2014-11-29 NOTE — Progress Notes (Signed)
Radiation Oncology         [phone] ________________________________  Initial outpatient Consultation  Name: Robin Miles MRN: [id number]  Date: 11/29/2014  DOB: [birth year]  EH:[id number],OIBB E, MD  Excell Seltzer, MD   REFERRING PHYSICIAN: Excell Seltzer, MD  DIAGNOSIS: Clinical stage II invasive ductal carcinoma of the right breast, central portion  HISTORY OF PRESENT ILLNESS::Robin Miles is a 39 y.o. female who is seen today at the courtesy of Dr. Excell Seltzer for an evaluation as it relates to the patient's recent diagnosis of invasive ductal carcinoma the right breast. Earlier this year the patient underwent screening mammography which was her second screening mammogram. This showed calcifications directly posterior to and slightly lateral to the nipple. This showed a segmental group of heterogeneous calcifications extending over 2.8 x 1.9 x 0.6 cm. At least one of these calcifications was immediately deep to the nipple area. Patient proceeded to undergo biopsy of this area which revealed invasive ductal carcinoma likely grade 2. The tumor was ER negative and PR negative with a proliferation marker of 29%. Tumor was HER-2/neu positive. She has met with medical oncology who is recommending consideration for neoadjuvant treatment.   Location of Breast Cancer: right breast cancer at subareolar 8:30 position  Histology per Pathology Report:  11/14/14 Diagnosis Breast, right, needle core biopsy, subareolar 8:30 o'clock - INVASIVE DUCTAL CARCINOMA. - DUCTAL CARCINOMA IN SITU. - ASSOCIATED CALCIFICATION. - SEE COMMENT.  Receptor Status: ER(0%), PR (0%), Her2-neu (positive)  Did patient present with symptoms (if so, please note symptoms) or was this found on screening mammography?: routine mammogram  Past/Anticipated interventions by surgeon, if any: Will see Dr. Excell Seltzer tomorrow for a port a chat insertion.  Will have surgery after chemotherapy - most likely in  September.  Past/Anticipated interventions by medical oncology, if any: Neoadjuvant chemotherapy with Heil to start 12/04/14  Lymphedema issues, if any:  no  Pain issues, if any:  no    OB GYN history: She menarched at 10. She has 6 children ( 2 sets of twins). She was exposed to fertility medications or hormone replacement therapy. She has family history of Breast/GYN/GI cancer First cousin breast cancer, grandmother breast cancer 67, another cousin with ovarian cancer   SAFETY ISSUES:  Prior radiation? no  Pacemaker/ICD? no  Possible current pregnancy?no  Is the patient on methotrexate? no  Current Complaints / other details:  Patient is here with her husband.  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  Patient is leaning towards having a mastectomy.      PREVIOUS RADIATION THERAPY: No  PAST MEDICAL HISTORY:  has a past medical history of Breast cancer, right breast; Breast cancer (2016); and Family history of breast cancer.    PAST SURGICAL HISTORY: Past Surgical History  Procedure Laterality Date  . Tonsilectomy/adenoidectomy with myringotomy    . Tubal ligation    . Knee reconstruction Right   . Tonsillectomy      FAMILY HISTORY: family history includes Breast cancer in her cousin, maternal aunt, and paternal grandmother; Cancer in her cousin.  SOCIAL HISTORY:  reports that she has never smoked. She has never used smokeless tobacco. She reports that she does not drink alcohol or use illicit drugs.  ALLERGIES: Review of patient's allergies indicates no known allergies.  MEDICATIONS:  Current Outpatient Prescriptions  Medication Sig Dispense Refill  . dexamethasone (DECADRON) 4 MG tablet Take 1 tablet (4 mg total) by mouth 2 (two) times daily. Start the day before  Taxotere. Then again the day after chemo for 3 days. 30 tablet 1  . lidocaine-prilocaine (EMLA) cream Apply to affected area once 30 g 3  . lidocaine-prilocaine (EMLA) cream Apply to affected  area once 30 g 3  . LORazepam (ATIVAN) 0.5 MG tablet Take 1 tablet (0.5 mg total) by mouth at bedtime. 30 tablet 0  . ondansetron (ZOFRAN) 8 MG tablet Take 1 tablet (8 mg total) by mouth 2 (two) times daily. Start the day after chemo for 3 days. Then take as needed for nausea or vomiting. 30 tablet 1  . prochlorperazine (COMPAZINE) 10 MG tablet Take 1 tablet (10 mg total) by mouth every 6 (six) hours as needed (Nausea or vomiting). 30 tablet 1   No current facility-administered medications for this encounter.    REVIEW OF SYSTEMS:  A 15 point review of systems is documented in the electronic medical record. This was obtained by the nursing staff. However, I reviewed this with the patient to discuss relevant findings and make appropriate changes.  No reports of nipple discharge or pain prior to diagnosis. Patient denies any new bony pain headaches dizziness or blurred vision.   PHYSICAL EXAM:  height is 5' 2.5" (1.588 m) and weight is 112 lb 4.8 oz (50.939 kg). Her oral temperature is 98.2 F (36.8 C). Her blood pressure is 110/66 and her pulse is 68. Her respiration is 16.    Lungs are clear. Heart has regular rate and rhythm. No palpable cervical, supraclavicular, or axillary adenopathy. Examination of the left breast reveals no mass or nipple discharge. Examination of the right breast reveals some mild bruising centrally. There is some thickening noted in the lower outer quadrant adjacent to the nipple area, likely related to biopsy changes   ECOG = 0  LABORATORY DATA:  Lab Results  Component Value Date   WBC 7.5 02/26/2009   HGB * 02/26/2009    7.6 CRITICAL RESULT CALLED TO, READ BACK BY AND VERIFIED WITH: KIRKPATRICK,B ON 02/26/09 AT 0603 BY GARCONJ   HCT 22.0* 02/26/2009   MCV 84.8 02/26/2009   PLT 212 02/26/2009   NEUTROABS 3.7 02/26/2009   No results found for: NA, K, CL, CO2, GLUCOSE, CREATININE, CALCIUM    RADIOGRAPHY: Mr Breast Bilateral W Wo Contrast  11/20/2014   CLINICAL  DATA:  Recently diagnosed right breast invasive ductal carcinoma and ductal carcinoma in situ with associated calcifications. Family history of breast cancer including a paternal grandmother at age 80 and maternal first cousin at age 41.  LABS:  None obtained today.  EXAM: BILATERAL BREAST MRI WITH AND WITHOUT CONTRAST  TECHNIQUE: Multiplanar, multisequence MR images of both breasts were obtained prior to and following the intravenous administration of 9 ml of MultiHance.  THREE-DIMENSIONAL MR IMAGE RENDERING ON INDEPENDENT WORKSTATION:  Three-dimensional MR images were rendered by post-processing of the original MR data on an independent workstation. The three-dimensional MR images were interpreted, and findings are reported in the following complete MRI report for this study. Three dimensional images were evaluated at the independent DynaCad workstation  COMPARISON:  Recent mammogram and biopsy examinations.  FINDINGS: Breast composition: c. Heterogeneous fibroglandular tissue.  Background parenchymal enhancement: Moderate.  Right breast: 3.7 x 2.8 x 1.7 cm area of non mass enhancement in the retroareolar right breast extending into the lower outer quadrant and, to a lesser degree, into the lower inner quadrant. This also extends into the nipple and is involving the anterior 2/3 of the breast. There are post biopsy changes with  a biopsy marker clip artifact within this area in the lower outer quadrant. The area of enhancement includes the recently demonstrated mammographic calcifications.  Left breast: No mass or abnormal enhancement.  Lymph nodes: No abnormal appearing lymph nodes.  Ancillary findings:  None.  IMPRESSION: 1. 3.7 x 2.8 x 1.7 cm area of biopsy-proven invasive ductal carcinoma and ductal carcinoma in situ in the retroareolar right breast, extending into the lower outer and, to a lesser degree, into the lower inner quadrants of the breast. This is involving the right nipple. 2. No adenopathy and no  evidence of malignancy on the left.  RECOMMENDATION: Treatment plan.  BI-RADS CATEGORY  6: Known biopsy-proven malignancy.   Electronically Signed   By: Claudie Revering M.D.   On: 11/20/2014 13:15   Mm Digital Diagnostic Unilat R  11/14/2014   CLINICAL DATA:  Post right breast stereotactic biopsy.  EXAM: DIAGNOSTIC RIGHT MAMMOGRAM POST STEREOTACTIC BIOPSY  COMPARISON:  Previous exam(s).  FINDINGS: Mammographic images were obtained following stereotactic guided biopsy of calcifications within the subareolar portion of the right breast at the 8:30 o'clock position. The coil shaped clip is in appropriate position.  IMPRESSION: Appropriate positioning of clip following right breast stereotactic biopsy.  Final Assessment: Post Procedure Mammograms for Marker Placement   Electronically Signed   By: Altamese Cabal M.D.   On: 11/14/2014 11:27   Mm Digital Diagnostic Unilat R  11/08/2014   CLINICAL DATA:  Calcifications right breast identified on recent baseline screening mammogram. The patient is 39 years old  EXAM: DIGITAL DIAGNOSTIC RIGHT MAMMOGRAM  COMPARISON:  10/25/2014  ACR Breast Density Category c: The breast tissue is heterogeneously dense, which may obscure small masses.  FINDINGS: Magnification views of the retroareolar right breast, directly posterior to and slightly lateral to nipple show a segmental group of heterogeneous calcifications. Calcifications span 2.8 cm anterior posterior x 1.9 cm transverse x 0.6 cm craniocaudal. It is noted that at least one of the calcifications is immediately deep to the nipple. No associated mass or distortion.  IMPRESSION: Suspicious calcifications right breast. Findings are concerning for ductal carcinoma in situ.  RECOMMENDATION: Attempt at stereotactic biopsy of right breast calcifications is recommended. Nine discussed with the patient today that we will attempt a stereotactic biopsy, but it may be prohibited by the thickness of the patient's breast when compressed.  She has been scheduled for attempt at stereotactic biopsy next week.  I have discussed the findings and recommendations with the patient. Results were also provided in writing at the conclusion of the visit. If applicable, a reminder letter will be sent to the patient regarding the next appointment.  BI-RADS CATEGORY  4: Suspicious.   Electronically Signed   By: Curlene Dolphin M.D.   On: 11/08/2014 11:19   Mm Rt Breast Bx W Loc Dev 1st Lesion Image Bx Spec Stereo Guide  11/20/2014   ADDENDUM REPORT: 11/19/2014 15:37  ADDENDUM: Pathology revealed right breast invasive ductal carcinoma and ductal carcinoma in situ with associated calcifications. This was found to be concordant by Dr. Abelardo Diesel. Pathology results were discussed with the patient via telephone. The patient reported tenderness at the biopsy site. Post biopsy instructions were reviewed and questions were answered. The patient was encouraged to call [facility] with any additional questions and or concerns. A surgical consultation was made with Dr. Adonis Housekeeper of [facility] on Nov 23, 2014.  Pathology results reported by Terie Purser RN on Nov 19, 2014.  Electronically Signed   By: Altamese Cabal M.D.   On: 11/19/2014 15:37   11/20/2014   CLINICAL DATA:  Right breast calcifications.  EXAM: RIGHT BREAST STEREOTACTIC CORE NEEDLE BIOPSY  COMPARISON:  Previous exams.  FINDINGS: The patient and I discussed the procedure of stereotactic-guided biopsy including benefits and alternatives. We discussed the high likelihood of a successful procedure. We discussed the risks of the procedure including infection, bleeding, tissue injury, clip migration, and inadequate sampling. Informed written consent was given. The usual time out protocol was performed immediately prior to the procedure.  Using sterile technique and 2% lidocaine as local anesthetic, under stereotactic guidance, a 9 gauge vacuum assisted Eviva petite  device was used to perform core needle biopsy of calcifications located within the subareolar right breast at the 8:30 o'clock position using a lateral approach. Specimen radiograph was performed showing representative calcifications within the specimen. Specimens with calcifications are identified for pathology.  At the conclusion of the procedure, a coil shaped tissue marker clip was deployed into the biopsy cavity. Follow-up 2-view mammogram was performed and dictated separately.  IMPRESSION: Stereotactic-guided biopsy of right breast calcifications as discussed above. No apparent complications.  Electronically Signed: By: Altamese Cabal M.D. On: 11/14/2014 11:26      IMPRESSION:  Clinical stage II invasive ductal carcinoma of the right breast. Based on MRI the patient has rather large lesion as it relates to her breast size and would likely benefit from neoadjuvant chemotherapy as planned.  I discussed the options for local regional management today with the patient and her husband. We discussed lumpectomy and radiation versus mastectomy. Also discussed the patient may require a central mastectomy if she proceeds with breast conservation therapy given the location of her lesion. At This time the patient is undecided which treatment approach she would like to potentially proceed with and we will await results of her genetic testing prior to any decision making concerning this issue.   PLAN: Patient will proceed with neoadjuvant chemotherapy in the near future.     This document serves as a record of services personally performed by Gery Pray, MD. It was created on his behalf by Jeralene Peters, a trained medical scribe. The creation of this record is based on the scribe's personal observations and the provider's statements to them. This document has been checked and approved by the attending provider.       ------------------------------------------------  Blair Promise, PhD, MD

## 2014-11-29 NOTE — Progress Notes (Signed)
Please see the Nurse Progress Note in the MD Initial Consult Encounter for this patient. 

## 2014-11-29 NOTE — Progress Notes (Signed)
Patient and husband at Chemo class this am.  Discussed side effects of Carbo, Taxotere and Herceptin and Perjeta including but not limited to peripheral neuropathy, myelosuppression, nausea and vomiting, dietary concerns and portacath specifics.  Teach back done.  Questions answered

## 2014-11-30 ENCOUNTER — Ambulatory Visit (HOSPITAL_BASED_OUTPATIENT_CLINIC_OR_DEPARTMENT_OTHER): Payer: BLUE CROSS/BLUE SHIELD | Admitting: Anesthesiology

## 2014-11-30 ENCOUNTER — Ambulatory Visit (HOSPITAL_BASED_OUTPATIENT_CLINIC_OR_DEPARTMENT_OTHER)
Admission: RE | Admit: 2014-11-30 | Discharge: 2014-11-30 | Disposition: A | Discharge status: Home / Self Care | Payer: BLUE CROSS/BLUE SHIELD | Source: Ambulatory Visit | Attending: General Surgery | Admitting: General Surgery

## 2014-11-30 ENCOUNTER — Encounter (HOSPITAL_BASED_OUTPATIENT_CLINIC_OR_DEPARTMENT_OTHER): Payer: Self-pay | Admitting: Anesthesiology

## 2014-11-30 ENCOUNTER — Ambulatory Visit (HOSPITAL_COMMUNITY): Payer: BLUE CROSS/BLUE SHIELD

## 2014-11-30 ENCOUNTER — Encounter (HOSPITAL_BASED_OUTPATIENT_CLINIC_OR_DEPARTMENT_OTHER)
Admission: RE | Disposition: A | Discharge status: Home / Self Care | Payer: Self-pay | Source: Ambulatory Visit | Attending: General Surgery

## 2014-11-30 DIAGNOSIS — C50911 Malignant neoplasm of unspecified site of right female breast: Secondary | ICD-10-CM | POA: Insufficient documentation

## 2014-11-30 DIAGNOSIS — Z419 Encounter for procedure for purposes other than remedying health state, unspecified: Secondary | ICD-10-CM

## 2014-11-30 HISTORY — PX: PORTACATH PLACEMENT: SHX2246

## 2014-11-30 LAB — POCT HEMOGLOBIN-HEMACUE: Hemoglobin: 13.7 g/dL (ref 12.0–15.0)

## 2014-11-30 SURGERY — INSERTION, TUNNELED CENTRAL VENOUS DEVICE, WITH PORT
Anesthesia: General | Site: Chest

## 2014-11-30 MED ORDER — GLYCOPYRROLATE 0.2 MG/ML IJ SOLN: 0.2000 mg | Freq: Once | INTRAMUSCULAR | Status: DC | PRN

## 2014-11-30 MED ORDER — HEPARIN SOD (PORK) LOCK FLUSH 100 UNIT/ML IV SOLN
INTRAVENOUS | Status: DC | PRN
  Administered 2014-11-30: 500 [IU] via INTRAVENOUS
  Administered 2014-11-30: 20 [IU] via INTRAVENOUS

## 2014-11-30 MED ORDER — OXYCODONE HCL 5 MG PO TABS
ORAL_TABLET | ORAL | Status: AC
  Filled 2014-11-30: qty 1

## 2014-11-30 MED ORDER — LIDOCAINE HCL (CARDIAC) 20 MG/ML IV SOLN
INTRAVENOUS | Status: DC | PRN
  Administered 2014-11-30: 10 mg via INTRAVENOUS

## 2014-11-30 MED ORDER — SODIUM BICARBONATE 4 % IV SOLN
INTRAVENOUS | Status: DC | PRN
  Administered 2014-11-30: 5 mL via INTRAVENOUS

## 2014-11-30 MED ORDER — PROPOFOL INFUSION 10 MG/ML OPTIME
INTRAVENOUS | Status: DC | PRN
  Administered 2014-11-30: 25 ug/kg/min via INTRAVENOUS

## 2014-11-30 MED ORDER — BUPIVACAINE-EPINEPHRINE 0.25% -1:200000 IJ SOLN
INTRAMUSCULAR | Status: DC | PRN
  Administered 2014-11-30: 30 mL

## 2014-11-30 MED ORDER — HEPARIN SOD (PORK) LOCK FLUSH 100 UNIT/ML IV SOLN
INTRAVENOUS | Status: AC
  Filled 2014-11-30: qty 5

## 2014-11-30 MED ORDER — FENTANYL CITRATE (PF) 100 MCG/2ML IJ SOLN: 50.0000 ug | INTRAMUSCULAR | Status: DC | PRN

## 2014-11-30 MED ORDER — FENTANYL CITRATE (PF) 100 MCG/2ML IJ SOLN: 25.0000 ug | INTRAMUSCULAR | Status: DC | PRN

## 2014-11-30 MED ORDER — LIDOCAINE HCL (PF) 1 % IJ SOLN
INTRAMUSCULAR | Status: DC | PRN
  Administered 2014-11-30: 30 mL

## 2014-11-30 MED ORDER — HYDROCODONE-ACETAMINOPHEN 5-325 MG PO TABS: 1.0000 | ORAL_TABLET | ORAL | Status: DC | PRN

## 2014-11-30 MED ORDER — BUPIVACAINE-EPINEPHRINE (PF) 0.5% -1:200000 IJ SOLN
INTRAMUSCULAR | Status: AC
  Filled 2014-11-30: qty 30

## 2014-11-30 MED ORDER — ONDANSETRON HCL 4 MG/2ML IJ SOLN
INTRAMUSCULAR | Status: DC | PRN
  Administered 2014-11-30: 4 mg via INTRAVENOUS

## 2014-11-30 MED ORDER — LIDOCAINE HCL (PF) 1 % IJ SOLN
INTRAMUSCULAR | Status: AC
  Filled 2014-11-30: qty 30

## 2014-11-30 MED ORDER — MIDAZOLAM HCL 5 MG/5ML IJ SOLN
INTRAMUSCULAR | Status: DC | PRN
  Administered 2014-11-30 (×2): 1 mg via INTRAVENOUS

## 2014-11-30 MED ORDER — LACTATED RINGERS IV SOLN
INTRAVENOUS | Status: DC
  Administered 2014-11-30 (×2): via INTRAVENOUS

## 2014-11-30 MED ORDER — MIDAZOLAM HCL 2 MG/2ML IJ SOLN: 1.0000 mg | INTRAMUSCULAR | Status: DC | PRN

## 2014-11-30 MED ORDER — MEPERIDINE HCL 25 MG/ML IJ SOLN: 6.2500 mg | INTRAMUSCULAR | Status: DC | PRN

## 2014-11-30 MED ORDER — MIDAZOLAM HCL 2 MG/2ML IJ SOLN
INTRAMUSCULAR | Status: AC
  Filled 2014-11-30: qty 2

## 2014-11-30 MED ORDER — OXYCODONE HCL 5 MG PO TABS
5.0000 mg | ORAL_TABLET | Freq: Once | ORAL | Status: AC
  Administered 2014-11-30: 5 mg via ORAL

## 2014-11-30 MED ORDER — FENTANYL CITRATE (PF) 100 MCG/2ML IJ SOLN
INTRAMUSCULAR | Status: DC | PRN
  Administered 2014-11-30 (×2): 50 ug via INTRAVENOUS

## 2014-11-30 MED ORDER — BUPIVACAINE-EPINEPHRINE (PF) 0.25% -1:200000 IJ SOLN
INTRAMUSCULAR | Status: AC
  Filled 2014-11-30: qty 30

## 2014-11-30 MED ORDER — HEPARIN (PORCINE) IN NACL 2-0.9 UNIT/ML-% IJ SOLN
INTRAMUSCULAR | Status: AC
  Filled 2014-11-30: qty 500

## 2014-11-30 MED ORDER — CEFAZOLIN SODIUM-DEXTROSE 2-3 GM-% IV SOLR
INTRAVENOUS | Status: DC | PRN
  Administered 2014-11-30: 2 g via INTRAVENOUS

## 2014-11-30 MED ORDER — FENTANYL CITRATE (PF) 100 MCG/2ML IJ SOLN
INTRAMUSCULAR | Status: AC
Start: 2014-11-30 — End: 2014-11-30
  Filled 2014-11-30: qty 6

## 2014-11-30 MED ORDER — PROPOFOL INFUSION 10 MG/ML OPTIME: INTRAVENOUS | Status: DC | PRN

## 2014-11-30 MED ORDER — CEFAZOLIN SODIUM-DEXTROSE 2-3 GM-% IV SOLR
INTRAVENOUS | Status: AC
  Filled 2014-11-30: qty 50

## 2014-11-30 SURGICAL SUPPLY — 39 items
BAG DECANTER FOR FLEXI CONT (MISCELLANEOUS) ×2 IMPLANT
BANDAGE ADH SHEER 1  50/CT (GAUZE/BANDAGES/DRESSINGS) ×2 IMPLANT
BLADE SURG 15 STRL LF DISP TIS (BLADE) ×1 IMPLANT
BLADE SURG 15 STRL SS (BLADE) ×2
CHLORAPREP W/TINT 26ML (MISCELLANEOUS) ×2 IMPLANT
COVER BACK TABLE 60X90IN (DRAPES) ×2 IMPLANT
COVER MAYO STAND STRL (DRAPES) ×2 IMPLANT
DECANTER SPIKE VIAL GLASS SM (MISCELLANEOUS) IMPLANT
DRAPE C-ARM 42X72 X-RAY (DRAPES) ×2 IMPLANT
DRAPE LAPAROTOMY T 102X78X121 (DRAPES) ×2 IMPLANT
DRAPE UTILITY XL STRL (DRAPES) ×2 IMPLANT
ELECT REM PT RETURN 9FT ADLT (ELECTROSURGICAL) ×2
ELECTRODE REM PT RTRN 9FT ADLT (ELECTROSURGICAL) ×1 IMPLANT
GLOVE BIOGEL PI IND STRL 8 (GLOVE) ×1 IMPLANT
GLOVE BIOGEL PI INDICATOR 8 (GLOVE)
GLOVE ECLIPSE 7.5 STRL STRAW (GLOVE) ×2 IMPLANT
GOWN STRL REUS W/ TWL LRG LVL3 (GOWN DISPOSABLE) ×1 IMPLANT
GOWN STRL REUS W/ TWL XL LVL3 (GOWN DISPOSABLE) ×1 IMPLANT
GOWN STRL REUS W/TWL LRG LVL3 (GOWN DISPOSABLE)
GOWN STRL REUS W/TWL XL LVL3 (GOWN DISPOSABLE) ×4
IV CATH PLACEMENT UNIT 16 GA (IV SOLUTION) IMPLANT
IV KIT MINILOC 20X1 SAFETY (NEEDLE) IMPLANT
KIT PORT POWER 8FR ISP CVUE (Catheter) ×2 IMPLANT
LIQUID BAND (GAUZE/BANDAGES/DRESSINGS) ×2 IMPLANT
NDL HYPO 25X1 1.5 SAFETY (NEEDLE) ×1 IMPLANT
NEEDLE HYPO 22GX1.5 SAFETY (NEEDLE) ×2 IMPLANT
NEEDLE HYPO 25X1 1.5 SAFETY (NEEDLE) ×2 IMPLANT
PACK BASIN DAY SURGERY FS (CUSTOM PROCEDURE TRAY) ×2 IMPLANT
PENCIL BUTTON HOLSTER BLD 10FT (ELECTRODE) ×2 IMPLANT
SET SHEATH INTRODUCER 10FR (MISCELLANEOUS) IMPLANT
SHEATH COOK PEEL AWAY SET 9F (SHEATH) IMPLANT
SLEEVE SCD COMPRESS KNEE MED (MISCELLANEOUS) IMPLANT
SUT MON AB 4-0 PC3 18 (SUTURE) ×2 IMPLANT
SUT PROLENE 2 0 CT2 30 (SUTURE) ×2 IMPLANT
SUT SILK 4 0 TIES 17X18 (SUTURE) IMPLANT
SYR 5ML LUER SLIP (SYRINGE) ×2 IMPLANT
SYR CONTROL 10ML LL (SYRINGE) ×2 IMPLANT
TOWEL OR 17X24 6PK STRL BLUE (TOWEL DISPOSABLE) ×4 IMPLANT
TOWEL OR NON WOVEN STRL DISP B (DISPOSABLE) ×2 IMPLANT

## 2014-11-30 NOTE — Op Note (Signed)
Preoperative diagnosis: Cancer of the breast and the poor venous access  Postoperative diagnosis: Same  Procedure: Placement of ClearVue subcutaneous venous port  Surgeon: Excell Seltzer M.D.  Anesthesia: Local with IV sedation  Description of procedure: Patient is brought to the operating room and placed in the supine position on the operating table. IV sedation was administered. The entire upper chest and neck were widely sterilely prepped and draped. Local anesthesia was used to infiltrate the insertion of port site. The left subclavian vein was cannulated with a needle and guidewire without difficulty and position in the superior vena cava was confirmed by fluoroscopy. The introducer was then placed over the guidewire and the flushed catheter placed via the introducer which was stripped away and the tip of the catheter positioned near the cavoatrial junction. A small transverse incision was made in the anterior chest wall and subcutaneous pocket created. The catheter was tunneled into the pocket, trimmed to length, and attached to the flushed port which was positioned in the pocket. The port was sutured to the chest wall with interrupted 2-0 Prolene. The incisions were closed with subcutaneous interrupted Monocryl and the skin incisions closed with subcuticular Monocryl and Dermabond. The port was accessed and flushed and aspirated easily and was left flushed with concentrated heparin solution. Sponge needle as the counts were correct. The patient was taken to recovery in good condition.  Robin Miles T  11/30/2014

## 2014-11-30 NOTE — Anesthesia Postprocedure Evaluation (Signed)
  Anesthesia Post-op Note  Patient: Robin Miles  Procedure(s) Performed: Procedure(s): INSERTION PORT-A-CATH (N/A)  Patient Location: PACU  Anesthesia Type: General   Level of Consciousness: awake, alert  and oriented  Airway and Oxygen Therapy: Patient Spontanous Breathing  Post-op Pain: none  Post-op Assessment: Post-op Vital signs reviewed  Post-op Vital Signs: Reviewed  Last Vitals:  Filed Vitals:   11/30/14 1430  BP: 102/66  Pulse: 60  Temp:   Resp: 12    Complications: No apparent anesthesia complications

## 2014-11-30 NOTE — Transfer of Care (Signed)
Immediate Anesthesia Transfer of Care Note  Patient: Robin Miles  Procedure(s) Performed: Procedure(s): INSERTION PORT-A-CATH (N/A)  Patient Location: PACU  Anesthesia Type:MAC  Level of Consciousness: awake and patient cooperative  Airway & Oxygen Therapy: Patient Spontanous Breathing and Patient connected to face mask oxygen  Post-op Assessment: Report given to RN and Post -op Vital signs reviewed and stable  Post vital signs: Reviewed and stable  Last Vitals:  Filed Vitals:   11/30/14 1140  BP: 105/56  Pulse: 73  Temp: 36.7 C  Resp: 20    Complications: No apparent anesthesia complications

## 2014-11-30 NOTE — Anesthesia Preprocedure Evaluation (Signed)
Anesthesia Evaluation  Patient identified by MRN, date of birth, ID band Patient awake    Reviewed: Allergy & Precautions, NPO status , Patient's Chart, lab work & pertinent test results  Airway Mallampati: I  TM Distance: >3 FB Neck ROM: Full    Dental  (+) Teeth Intact, Dental Advisory Given   Pulmonary  breath sounds clear to auscultation        Cardiovascular Rhythm:Regular Rate:Normal     Neuro/Psych    GI/Hepatic   Endo/Other    Renal/GU      Musculoskeletal   Abdominal   Peds  Hematology   Anesthesia Other Findings   Reproductive/Obstetrics                             Anesthesia Physical Anesthesia Plan  ASA: II  Anesthesia Plan: General   Post-op Pain Management:    Induction: Intravenous  Airway Management Planned: Simple Face Mask  Additional Equipment:   Intra-op Plan:   Post-operative Plan:   Informed Consent: I have reviewed the patients History and Physical, chart, labs and discussed the procedure including the risks, benefits and alternatives for the proposed anesthesia with the patient or authorized representative who has indicated his/her understanding and acceptance.   Dental advisory given  Plan Discussed with: CRNA, Anesthesiologist and Surgeon  Anesthesia Plan Comments:         Anesthesia Quick Evaluation

## 2014-11-30 NOTE — H&P (Signed)
History of Present Illness Marland Kitchen T. Tensley Wery MD; 11/23/2014 10:38 AM) Patient words: breast check.  The patient is a 39 year old female who presents with breast cancer. She is a pre menopausal female referred by Dr. Luberta Robertson for evaluation of recently diagnosed carcinoma of the right breast. She recently presented for a screening mamogram revealing a new area of microcalcifications in the right breast. She had had a previous initial negative mammogram at age 78. Subsequent imaging included diagnostic mamogram showing a heterogeneous area of calcifications directly posterior to the nipple and extending laterally measuring 2.8 cm AP by 1.9 cm transverse by 0.6 cm craniocaudal. This was noted to extend immediately deep to the nipple. Stereotactic guided breast biopsy was performed on April 29 with pathology revealing in situ and invasive carcinoma of the breast. She is seen now in the office for initial treatment planning. She has experienced no breast symptoms specifically lump or pain, nipple discharge or skin changes. She does not have a personal history of any previous breast problems. Subsequently bilateral breast MRI has been performed revealing a 3.7 x 2.8 x 1.7 cm area of non-mass-like enhancement in the retroareolar right breast extending into the lower outer quadrant and lower inner quadrant involving the anterior two thirds of the breast.  Findings at that time were the following: Tumor size: 3.7 cm Tumor grade: 2, Ki-67 28% Estrogen Receptor: Negative Progesterone Receptor: Negative Her-2 neu: Positive Lymph node status: Negative    Other Problems Elbert Ewings, CMA; 11/23/2014 9:55 AM) Breast Cancer  Past Surgical History Elbert Ewings, CMA; 11/23/2014 9:55 AM) Breast Biopsy Right. Knee Surgery Right. Tonsillectomy  Diagnostic Studies History Elbert Ewings, CMA; 11/23/2014 9:55 AM) Colonoscopy never Mammogram within last year Pap Smear 1-5 years ago  Allergies  Elbert Ewings, CMA; 11/23/2014 9:55 AM) No Known Drug Allergies05/12/2014  Medication History Elbert Ewings, CMA; 11/23/2014 9:55 AM) No Current Medications Medications Reconciled  Social History Elbert Ewings, CMA; 11/23/2014 9:55 AM) No alcohol use No drug use Tobacco use Never smoker.  Family History Elbert Ewings, [place]; 11/23/2014 9:55 AM) Breast Cancer Family Members In General. Diabetes Mellitus Family Members In General.  Pregnancy / Birth History Elbert Ewings, [place]; 11/23/2014 9:55 AM) Age at menarche 67 years. Gravida 4 Maternal age 45-25 Para 6 Regular periods  Review of Systems Elbert Ewings CMA; 11/23/2014 9:55 AM) General Not Present- Appetite Loss, Chills, Fatigue, Fever, Night Sweats, Weight Gain and Weight Loss. Skin Not Present- Change in Wart/Mole, Dryness, Hives, Jaundice, New Lesions, Non-Healing Wounds, Rash and Ulcer. HEENT Not Present- Earache, Hearing Loss, Hoarseness, Nose Bleed, Oral Ulcers, Ringing in the Ears, Seasonal Allergies, Sinus Pain, Sore Throat, Visual Disturbances, Wears glasses/contact lenses and Yellow Eyes. Respiratory Not Present- Bloody sputum, Chronic Cough, Difficulty Breathing, Snoring and Wheezing. Breast Not Present- Breast Mass, Breast Pain, Nipple Discharge and Skin Changes. Cardiovascular Not Present- Chest Pain, Difficulty Breathing Lying Down, Leg Cramps, Palpitations, Rapid Heart Rate, Shortness of Breath and Swelling of Extremities. Gastrointestinal Not Present- Abdominal Pain, Bloating, Bloody Stool, Change in Bowel Habits, Chronic diarrhea, Constipation, Difficulty Swallowing, Excessive gas, Gets full quickly at meals, Hemorrhoids, Indigestion, Nausea, Rectal Pain and Vomiting. Female Genitourinary Not Present- Frequency, Nocturia, Painful Urination, Pelvic Pain and Urgency. Musculoskeletal Not Present- Back Pain, Joint Pain, Joint Stiffness, Muscle Pain, Muscle Weakness and Swelling of Extremities. Neurological Not Present-  Decreased Memory, Fainting, Headaches, Numbness, Seizures, Tingling, Tremor, Trouble walking and Weakness. Psychiatric Not Present- Anxiety, Bipolar, Change in Sleep Pattern, Depression, Fearful and Frequent crying. Endocrine Not  Present- Cold Intolerance, Excessive Hunger, Hair Changes, Heat Intolerance, Hot flashes and New Diabetes. Hematology Not Present- Easy Bruising, Excessive bleeding, Gland problems, HIV and Persistent Infections.   Vitals Elbert Ewings CMA; 11/23/2014 9:56 AM) 11/23/2014 9:56 AM Weight: 113 lb Height: 63in Body Surface Area: 1.51 m Body Mass Index: 20.02 kg/m Temp.: 98.18F(Oral)  Pulse: 94 (Regular)  Resp.: 17 (Unlabored)  BP: 122/70 (Sitting, Left Arm, Standard)    Physical Exam Marland Kitchen T. Mattthew Ziomek MD; 11/23/2014 10:40 AM) The physical exam findings are as follows: Note:General: Alert, petite but well nourished Caucasian female, in no distress Skin: Warm and dry without rash or infection. HEENT: No palpable masses or thyromegaly. Sclera nonicteric. Pupils equal round and reactive. Lymph nodes: No cervical, supraclavicular, or inguinal nodes palpable. Breasts: Some thickening lateral right breast at the biopsy site probably postbiopsy change. Small breasts bilaterally. No other palpable abnormality or skin changes. No nipple retraction or crusting. Lungs: Breath sounds clear and equal. No wheezing or increased work of breathing. Cardiovascular: Regular rate and rhythm without murmer. No JVD or edema. Peripheral pulses intact. No carotid bruits. Neurologic: Alert and fully oriented. Gait normal. No focal weakness. Psychiatric: Normal mood and affect. Thought content appropriate with normal judgement and insight    Assessment & Plan Marland Kitchen T. Malek Skog MD; 11/23/2014 10:43 AM) INVASIVE DUCTAL CARCINOMA OF BREAST, RIGHT (174.9  C50.911) Impression: 39 year old female with a new diagnosis of cancer of the right breast, upper outer and lower outer  quadrant. Clinical stage 1B, ER negative, PR negative, HER-2 positive. I discussed with the patient and family members present today initial surgical treatment options. We discussed options of breast conservation with lumpectomy or total mastectomy and sentinal lymph node biopsy/dissection. It is difficult to know the extent of her invasive disease but likely this is mostly in situ. However she is ER/PR negative and I believe would benefit from neoadjuvant chemotherapy. She may ultimately still need or want total mastectomy. She will need genetics. We discussed Port-A-Cath placement in detail including the indications and nature of the procedure and risks of anesthetic complications, bleeding, infection, pneumothorax and long-term risks of catheter occlusion and DVT. All her questions were answered. I will await oncology evaluation prior to scheduling her Port-A-Cath Current Plans  Referred to Oncology, for evaluation and follow up (Oncology). Referred to Radiation Oncology, for evaluation and follow up (Radiation Oncology). Referred to Genetic Counseling, for evaluation and follow up [employer]). Schedule for Surgery Placement of Port-A-Cath under general anesthesia as an outpatient following oncology evaluation

## 2014-11-30 NOTE — Anesthesia Procedure Notes (Signed)
Procedure Name: MAC Date/Time: 11/30/2014 1:29 PM Performed by: Marrianne Mood Pre-anesthesia Checklist: Patient identified, Timeout performed, Emergency Drugs available, Suction available and Patient being monitored Patient Re-evaluated:Patient Re-evaluated prior to inductionOxygen Delivery Method: Simple face mask

## 2014-11-30 NOTE — Discharge Instructions (Signed)

## 2014-11-30 NOTE — Interval H&P Note (Signed)
History and Physical Interval Note:  11/30/2014 12:47 PM  Robin Miles  has presented today for surgery, with the diagnosis of right breast cancer  The various methods of treatment have been discussed with the patient and family. After consideration of risks, benefits and other options for treatment, the patient has consented to  Procedure(s): INSERTION PORT-A-CATH (N/A) as a surgical intervention .  The patient's history has been reviewed, patient examined, no change in status, stable for surgery.  I have reviewed the patient's chart and labs.  Questions were answered to the patient's satisfaction.     [name]

## 2014-12-03 ENCOUNTER — Other Ambulatory Visit: Payer: BLUE CROSS/BLUE SHIELD

## 2014-12-03 ENCOUNTER — Other Ambulatory Visit: Payer: Self-pay

## 2014-12-03 ENCOUNTER — Encounter: Payer: BLUE CROSS/BLUE SHIELD | Admitting: Genetic Counselor

## 2014-12-03 ENCOUNTER — Other Ambulatory Visit: Payer: Self-pay | Admitting: Hematology and Oncology

## 2014-12-03 ENCOUNTER — Encounter (HOSPITAL_BASED_OUTPATIENT_CLINIC_OR_DEPARTMENT_OTHER): Payer: Self-pay | Admitting: General Surgery

## 2014-12-03 DIAGNOSIS — C50111 Malignant neoplasm of central portion of right female breast: Secondary | ICD-10-CM

## 2014-12-03 NOTE — Assessment & Plan Note (Signed)
Right breast retroareolar mass 3.7 x 2.8 x 1.7 cm involving anterior two thirds of the breast, T2 N0 M0 stage II a clinical stage, ER 0%, appears 0%, HER-2 positive ratio 3.53  Current Treatment: TCHP today is cycle 1 day 1 Chemo monitoring: 1. ECHO 55-60% EF 2. Chemo education done 3. Port placed 4. Chemo education done  Blood counts were reviewed and they are adequate for treatment RTC in 1 week for tox check

## 2014-12-04 ENCOUNTER — Encounter: Payer: Self-pay | Admitting: Hematology and Oncology

## 2014-12-04 ENCOUNTER — Ambulatory Visit (HOSPITAL_BASED_OUTPATIENT_CLINIC_OR_DEPARTMENT_OTHER): Payer: BLUE CROSS/BLUE SHIELD

## 2014-12-04 ENCOUNTER — Encounter: Payer: Self-pay | Admitting: *Deleted

## 2014-12-04 ENCOUNTER — Ambulatory Visit (HOSPITAL_BASED_OUTPATIENT_CLINIC_OR_DEPARTMENT_OTHER): Payer: BLUE CROSS/BLUE SHIELD | Admitting: Hematology and Oncology

## 2014-12-04 ENCOUNTER — Other Ambulatory Visit (HOSPITAL_BASED_OUTPATIENT_CLINIC_OR_DEPARTMENT_OTHER): Payer: BLUE CROSS/BLUE SHIELD

## 2014-12-04 VITALS — BP 113/56 | HR 85 | Temp 97.9°F | Resp 18 | Ht 62.5 in | Wt 113.2 lb

## 2014-12-04 VITALS — BP 98/53 | HR 91 | Temp 99.1°F | Resp 18

## 2014-12-04 DIAGNOSIS — C50111 Malignant neoplasm of central portion of right female breast: Secondary | ICD-10-CM

## 2014-12-04 DIAGNOSIS — C50511 Malignant neoplasm of lower-outer quadrant of right female breast: Secondary | ICD-10-CM

## 2014-12-04 DIAGNOSIS — N8320 Unspecified ovarian cysts: Secondary | ICD-10-CM | POA: Diagnosis not present

## 2014-12-04 DIAGNOSIS — Z5111 Encounter for antineoplastic chemotherapy: Secondary | ICD-10-CM

## 2014-12-04 DIAGNOSIS — Z5112 Encounter for antineoplastic immunotherapy: Secondary | ICD-10-CM | POA: Diagnosis not present

## 2014-12-04 LAB — CBC WITH DIFFERENTIAL/PLATELET
BASO%: 0.1 % (ref 0.0–2.0)
Basophils Absolute: 0 10*3/uL (ref 0.0–0.1)
EOS%: 0 % (ref 0.0–7.0)
Eosinophils Absolute: 0 10*3/uL (ref 0.0–0.5)
HCT: 40.3 % (ref 34.8–46.6)
HGB: 14 g/dL (ref 11.6–15.9)
LYMPH%: 16.2 % (ref 14.0–49.7)
MCH: 30 pg (ref 25.1–34.0)
MCHC: 34.7 g/dL (ref 31.5–36.0)
MCV: 86.5 fL (ref 79.5–101.0)
MONO#: 0.6 10*3/uL (ref 0.1–0.9)
MONO%: 5.4 % (ref 0.0–14.0)
NEUT#: 8.4 10*3/uL — ABNORMAL HIGH (ref 1.5–6.5)
NEUT%: 78.3 % — ABNORMAL HIGH (ref 38.4–76.8)
Platelets: 279 10*3/uL (ref 145–400)
RBC: 4.66 10*6/uL (ref 3.70–5.45)
RDW: 12.8 % (ref 11.2–14.5)
WBC: 10.7 10*3/uL — ABNORMAL HIGH (ref 3.9–10.3)
lymph#: 1.7 10*3/uL (ref 0.9–3.3)

## 2014-12-04 LAB — COMPREHENSIVE METABOLIC PANEL (CC13)
ALT: 11 U/L (ref 0–55)
AST: 13 U/L (ref 5–34)
Albumin: 4.5 g/dL (ref 3.5–5.0)
Alkaline Phosphatase: 51 U/L (ref 40–150)
Anion Gap: 14 mEq/L — ABNORMAL HIGH (ref 3–11)
BUN: 16.3 mg/dL (ref 7.0–26.0)
CO2: 23 meq/L (ref 22–29)
CREATININE: 1 mg/dL (ref 0.6–1.1)
Calcium: 10.1 mg/dL (ref 8.4–10.4)
Chloride: 105 mEq/L (ref 98–109)
EGFR: 73 mL/min/{1.73_m2} — ABNORMAL LOW (ref >90)
GLUCOSE: 119 mg/dL (ref 70–140)
Potassium: 3.8 mEq/L (ref 3.5–5.1)
SODIUM: 142 meq/L (ref 136–145)
TOTAL PROTEIN: 7.2 g/dL (ref 6.4–8.3)
Total Bilirubin: 1.03 mg/dL (ref 0.20–1.20)

## 2014-12-04 MED ORDER — DIPHENHYDRAMINE HCL 25 MG PO CAPS
ORAL_CAPSULE | ORAL | Status: AC
  Filled 2014-12-04: qty 2

## 2014-12-04 MED ORDER — PALONOSETRON HCL INJECTION 0.25 MG/5ML
0.2500 mg | Freq: Once | INTRAVENOUS | Status: AC
  Administered 2014-12-04: 0.25 mg via INTRAVENOUS

## 2014-12-04 MED ORDER — SODIUM CHLORIDE 0.9 % IV SOLN
Freq: Once | INTRAVENOUS | Status: AC
  Administered 2014-12-04: 15:00:00 via INTRAVENOUS
  Filled 2014-12-04: qty 1

## 2014-12-04 MED ORDER — ACETAMINOPHEN 325 MG PO TABS
ORAL_TABLET | ORAL | Status: AC
  Filled 2014-12-04: qty 2

## 2014-12-04 MED ORDER — ACETAMINOPHEN 325 MG PO TABS
650.0000 mg | ORAL_TABLET | Freq: Once | ORAL | Status: AC
  Administered 2014-12-04: 650 mg via ORAL

## 2014-12-04 MED ORDER — TRASTUZUMAB CHEMO INJECTION 440 MG
8.0000 mg/kg | Freq: Once | INTRAVENOUS | Status: AC
  Administered 2014-12-04: 399 mg via INTRAVENOUS
  Filled 2014-12-04: qty 19

## 2014-12-04 MED ORDER — METHYLPREDNISOLONE SODIUM SUCC 125 MG IJ SOLR
125.0000 mg | Freq: Once | INTRAMUSCULAR | Status: AC | PRN
  Administered 2014-12-04: 125 mg via INTRAVENOUS

## 2014-12-04 MED ORDER — SODIUM CHLORIDE 0.9 % IV SOLN
514.8000 mg | Freq: Once | INTRAVENOUS | Status: AC
  Administered 2014-12-04: 510 mg via INTRAVENOUS
  Filled 2014-12-04: qty 51

## 2014-12-04 MED ORDER — DOCETAXEL CHEMO INJECTION 160 MG/16ML
75.0000 mg/m2 | Freq: Once | INTRAVENOUS | Status: AC
  Administered 2014-12-04: 110 mg via INTRAVENOUS
  Filled 2014-12-04: qty 11

## 2014-12-04 MED ORDER — SODIUM CHLORIDE 0.9 % IV SOLN
Freq: Once | INTRAVENOUS | Status: AC
  Administered 2014-12-04: 10:00:00 via INTRAVENOUS

## 2014-12-04 MED ORDER — SODIUM CHLORIDE 0.9 % IV SOLN
840.0000 mg | Freq: Once | INTRAVENOUS | Status: AC
  Administered 2014-12-04: 840 mg via INTRAVENOUS
  Filled 2014-12-04: qty 28

## 2014-12-04 MED ORDER — PALONOSETRON HCL INJECTION 0.25 MG/5ML
INTRAVENOUS | Status: AC
  Filled 2014-12-04: qty 5

## 2014-12-04 MED ORDER — SODIUM CHLORIDE 0.9 % IJ SOLN
10.0000 mL | INTRAMUSCULAR | Status: DC | PRN
  Administered 2014-12-04: 10 mL
  Filled 2014-12-04: qty 10

## 2014-12-04 MED ORDER — PEGFILGRASTIM 6 MG/0.6ML ~~LOC~~ PSKT
6.0000 mg | PREFILLED_SYRINGE | Freq: Once | SUBCUTANEOUS | Status: AC
  Administered 2014-12-04: 6 mg via SUBCUTANEOUS
  Filled 2014-12-04: qty 0.6

## 2014-12-04 MED ORDER — DIPHENHYDRAMINE HCL 25 MG PO CAPS
50.0000 mg | ORAL_CAPSULE | Freq: Once | ORAL | Status: AC
  Administered 2014-12-04: 50 mg via ORAL

## 2014-12-04 MED ORDER — HEPARIN SOD (PORK) LOCK FLUSH 100 UNIT/ML IV SOLN
500.0000 [IU] | Freq: Once | INTRAVENOUS | Status: AC | PRN
  Administered 2014-12-04: 500 [IU]
  Filled 2014-12-04: qty 5

## 2014-12-04 NOTE — Progress Notes (Signed)
Met with patient today with her 1st chemo treatment.  She is doing well. She is just a little anxious about the side effects and how she will feel.  She is supposed to work on Friday.  Encouraged her to call with any needs or concerns.  She verbalized understanding.

## 2014-12-04 NOTE — Progress Notes (Signed)
Patient Care Team: Olga Millers, MD as PCP - General (Obstetrics and Gynecology)  DIAGNOSIS: No matching staging information was found for the patient.  SUMMARY OF ONCOLOGIC HISTORY:   Cancer of central portion of right female breast   11/14/2014 Initial Diagnosis Right breast biopsy 8:30 position: Invasive ductal carcinoma with DCIS, grade 2, ER 0%, PR 0%, HER-2 positive ratio 3.53   11/20/2014 Breast MRI Right breast: 3.7 x 2.8 x 1.7 cm area of non-mass enhancement and retroareolar right breast into the lower outer quadrant and lower inner quadrant extending into the nipple involving anterior two thirds of the breast, no lymph nodes   12/04/2014 -  Neo-Adjuvant Chemotherapy Taxotere, carboplatin, Herceptin, Perjeta 6 cycles followed by Herceptin maintenance for 1 year    CHIEF COMPLIANT: Cycle 1 TC H Perjeta  INTERVAL HISTORY: Robin Miles is a 39 year old with above-mentioned history of right-sided breast cancer clinical stage IIA starting neoadjuvant chemotherapy with Taxotere carboplatin and Herceptin and Perjeta. Port been placed and is healed well. Echocardiogram showed a normal ejection fraction. Genetic testing has been ordered we do not have the results.  REVIEW OF SYSTEMS:   Constitutional: Denies fevers, chills or abnormal weight loss Eyes: Denies blurriness of vision Ears, nose, mouth, throat, and face: Denies mucositis or sore throat Respiratory: Denies cough, dyspnea or wheezes Cardiovascular: Denies palpitation, chest discomfort or lower extremity swelling Gastrointestinal:  Denies nausea, heartburn or change in bowel habits Skin: Denies abnormal skin rashes Lymphatics: Denies new lymphadenopathy or easy bruising Neurological:Denies numbness, tingling or new weaknesses Behavioral/Psych: Mood is stable, no new changes  All other systems were reviewed with the patient and are negative.  I have reviewed the past medical history, past surgical history, social history and  family history with the patient and they are unchanged from previous note.  ALLERGIES:  has No Known Allergies.  MEDICATIONS:  Current Outpatient Prescriptions  Medication Sig Dispense Refill  . dexamethasone (DECADRON) 4 MG tablet Take 1 tablet (4 mg total) by mouth 2 (two) times daily. Start the day before Taxotere. Then again the day after chemo for 3 days. 30 tablet 1  . HYDROcodone-acetaminophen (NORCO/VICODIN) 5-325 MG per tablet Take 1-2 tablets by mouth every 4 (four) hours as needed for moderate pain or severe pain. 25 tablet 0  . lidocaine-prilocaine (EMLA) cream Apply to affected area once 30 g 3  . lidocaine-prilocaine (EMLA) cream Apply to affected area once 30 g 3  . LORazepam (ATIVAN) 0.5 MG tablet Take 1 tablet (0.5 mg total) by mouth at bedtime. 30 tablet 0  . ondansetron (ZOFRAN) 8 MG tablet Take 1 tablet (8 mg total) by mouth 2 (two) times daily. Start the day after chemo for 3 days. Then take as needed for nausea or vomiting. 30 tablet 1  . prochlorperazine (COMPAZINE) 10 MG tablet Take 1 tablet (10 mg total) by mouth every 6 (six) hours as needed (Nausea or vomiting). 30 tablet 1  . UNABLE TO FIND 1 each by Other route once. Dispense cranial prosthesis per medical necessity for alopecia due to chemotherapy secondary to known breast cancer diagnosis. 1 each 0   No current facility-administered medications for this visit.    PHYSICAL EXAMINATION: ECOG PERFORMANCE STATUS: 1 - Symptomatic but completely ambulatory GENERAL:alert, no distress and comfortable SKIN: skin color, texture, turgor are normal, no rashes or significant lesions EYES: normal, Conjunctiva are pink and non-injected, sclera clear OROPHARYNX:no exudate, no erythema and lips, buccal mucosa, and tongue normal  NECK: supple, thyroid normal  size, non-tender, without nodularity LYMPH:  no palpable lymphadenopathy in the cervical, axillary or inguinal LUNGS: clear to auscultation and percussion with normal  breathing effort HEART: regular rate & rhythm and no murmurs and no lower extremity edema ABDOMEN:abdomen soft, non-tender and normal bowel sounds Musculoskeletal:no cyanosis of digits and no clubbing  NEURO: alert & oriented x 3 with fluent speech, no focal motor/sensory deficits  LABORATORY DATA:  I have reviewed the data as listed   Chemistry   No results found for: NA, K, CL, CO2, BUN, CREATININE, GLU No results found for: CALCIUM, ALKPHOS, AST, ALT, BILITOT     Lab Results  Component Value Date   WBC 7.5 02/26/2009   HGB 13.7 11/30/2014   HCT 22.0* 02/26/2009   MCV 84.8 02/26/2009   PLT 212 02/26/2009   NEUTROABS 3.7 02/26/2009     RADIOGRAPHIC STUDIES: I have personally reviewed the radiology reports and agreed with their findings. Echocardiogram showed ejection fraction 55-60%  ASSESSMENT & PLAN:  Cancer of central portion of right female breast Right breast retroareolar mass 3.7 x 2.8 x 1.7 cm involving anterior two thirds of the breast, T2 N0 M0 stage II a clinical stage, ER 0%, PR 0%, HER-2 positive ratio 3.53  Current Treatment: TCHP every 3 weeks 6 cycles; today is cycle 1 day 1 Chemo monitoring: 1. ECHO 55-60% EF 2. Chemo education done 3. Port placed 4. Chemo education done Ovarian cyst: Patient has a follow-up appointment Dr. Ouida Sills in 3 weeks. Blood counts were reviewed and they are adequate for treatment RTC in 1 week for tox check  No orders of the defined types were placed in this encounter.   The patient has a good understanding of the overall plan. she agrees with it. she will call with any problems that may develop before the next visit here.   Rulon Eisenmenger, MD

## 2014-12-04 NOTE — Progress Notes (Signed)
I placed leave forms on desk of nurse for dr. Lindi Adie

## 2014-12-04 NOTE — Progress Notes (Signed)
Robin Miles complaining of nausea and face is red and flushed.  Perjeta stopped and NS hung.  Dr. Lindi Adie notified and order received to give pt Solumedrol 125 mg IV now and to hold Perjeta until symptoms resolve.    1305-Nausea easing and face is now pink in color.  Will continue to monitor pt closely.  Perjeta remains on hold.

## 2014-12-04 NOTE — Patient Instructions (Signed)
Robin Miles Discharge Instructions for Patients Receiving Chemotherapy  Today you received the following chemotherapy agents Herceptin, Perjeta, Docetaxel, and Carboplatin.   To help prevent nausea and vomiting after your treatment, we encourage you to take your nausea medication as directed.    If you develop nausea and vomiting that is not controlled by your nausea medication, call the clinic.   BELOW ARE SYMPTOMS THAT SHOULD BE REPORTED IMMEDIATELY:  *FEVER GREATER THAN 100.5 F  *CHILLS WITH OR WITHOUT FEVER  NAUSEA AND VOMITING THAT IS NOT CONTROLLED WITH YOUR NAUSEA MEDICATION  *UNUSUAL SHORTNESS OF BREATH  *UNUSUAL BRUISING OR BLEEDING  TENDERNESS IN MOUTH AND THROAT WITH OR WITHOUT PRESENCE OF ULCERS  *URINARY PROBLEMS  *BOWEL PROBLEMS  UNUSUAL RASH Items with * indicate a potential emergency and should be followed up as soon as possible.  Feel free to call the clinic you have any questions or concerns. The clinic phone number is [phone].  Please show the [facility] at check-in to the Emergency Department and triage nurse.

## 2014-12-05 ENCOUNTER — Telehealth: Payer: Self-pay

## 2014-12-05 NOTE — Progress Notes (Signed)
Leave forms returned to managed care. TKF

## 2014-12-05 NOTE — Telephone Encounter (Signed)
Spoke with pt about response to first chemo.  Pt report diarrhea yesterday during infusion but none since.  Some nausea today - resolved with compazine.  Patient had taken zofran - let patient know Aloxi works for 3 days so no zofran until Sat.  Pt should take compazine or ativan for nausea until then.  Discussed use of claritin and tylenol for neulasta pain and discouraged use of ibuprofen.   Pt denies fever, pain, mouth tenderness or sores.  Reports she is eating and drinking well.  Pt advised to call clinic with any questions or concerns she may have.  Pt voiced understanding.

## 2014-12-07 ENCOUNTER — Encounter: Payer: Self-pay | Admitting: Hematology and Oncology

## 2014-12-07 NOTE — Progress Notes (Signed)
I called and left the patient a message that her forms were faxed to [phone] and her copy will be left with ms wilma at front desk for pick up on Monday 12/10/14

## 2014-12-09 NOTE — Assessment & Plan Note (Signed)
Right breast retroareolar mass 3.7 x 2.8 x 1.7 cm involving anterior two thirds of the breast, T2 N0 M0 stage II a clinical stage, ER 0%, appears 0%, HER-2 positive ratio 3.53  Current Treatment: TCHP today is cycle 1 day 8 Chemo monitoring: ECHO 55-60% EF  Blood counts were reviewed and they are adequate for treatment RTC in 2 week for cycle 2

## 2014-12-10 ENCOUNTER — Encounter: Payer: Self-pay | Admitting: *Deleted

## 2014-12-10 ENCOUNTER — Other Ambulatory Visit (HOSPITAL_BASED_OUTPATIENT_CLINIC_OR_DEPARTMENT_OTHER): Payer: BLUE CROSS/BLUE SHIELD

## 2014-12-10 ENCOUNTER — Telehealth: Payer: Self-pay | Admitting: Hematology and Oncology

## 2014-12-10 ENCOUNTER — Ambulatory Visit (HOSPITAL_BASED_OUTPATIENT_CLINIC_OR_DEPARTMENT_OTHER): Payer: BLUE CROSS/BLUE SHIELD | Admitting: Hematology and Oncology

## 2014-12-10 ENCOUNTER — Telehealth: Payer: Self-pay | Admitting: *Deleted

## 2014-12-10 VITALS — BP 101/53 | HR 90 | Temp 97.5°F | Resp 18 | Ht 62.5 in | Wt 112.4 lb

## 2014-12-10 DIAGNOSIS — R197 Diarrhea, unspecified: Secondary | ICD-10-CM | POA: Diagnosis not present

## 2014-12-10 DIAGNOSIS — C50111 Malignant neoplasm of central portion of right female breast: Secondary | ICD-10-CM

## 2014-12-10 LAB — CBC WITH DIFFERENTIAL/PLATELET
BASO%: 0.7 % (ref 0.0–2.0)
BASOS ABS: 0 10*3/uL (ref 0.0–0.1)
EOS ABS: 0.1 10*3/uL (ref 0.0–0.5)
EOS%: 1.8 % (ref 0.0–7.0)
HEMATOCRIT: 41.3 % (ref 34.8–46.6)
HGB: 14.1 g/dL (ref 11.6–15.9)
LYMPH%: 43 % (ref 14.0–49.7)
MCH: 29.5 pg (ref 25.1–34.0)
MCHC: 34.1 g/dL (ref 31.5–36.0)
MCV: 86.4 fL (ref 79.5–101.0)
MONO#: 0.8 10*3/uL (ref 0.1–0.9)
MONO%: 14.6 % — ABNORMAL HIGH (ref 0.0–14.0)
NEUT#: 2.3 10*3/uL (ref 1.5–6.5)
NEUT%: 39.9 % (ref 38.4–76.8)
Platelets: 195 10*3/uL (ref 145–400)
RBC: 4.78 10*6/uL (ref 3.70–5.45)
RDW: 12.3 % (ref 11.2–14.5)
WBC: 5.6 10*3/uL (ref 3.9–10.3)
lymph#: 2.4 10*3/uL (ref 0.9–3.3)

## 2014-12-10 LAB — COMPREHENSIVE METABOLIC PANEL (CC13)
ALT: 18 U/L (ref 0–55)
ANION GAP: 10 meq/L (ref 3–11)
AST: 15 U/L (ref 5–34)
Albumin: 3.9 g/dL (ref 3.5–5.0)
Alkaline Phosphatase: 74 U/L (ref 40–150)
BUN: 14.2 mg/dL (ref 7.0–26.0)
CO2: 25 meq/L (ref 22–29)
Calcium: 9.4 mg/dL (ref 8.4–10.4)
Chloride: 104 mEq/L (ref 98–109)
Creatinine: 1 mg/dL (ref 0.6–1.1)
EGFR: 70 mL/min/{1.73_m2} — ABNORMAL LOW (ref >90)
GLUCOSE: 96 mg/dL (ref 70–140)
Potassium: 4.1 mEq/L (ref 3.5–5.1)
Sodium: 140 mEq/L (ref 136–145)
Total Bilirubin: 0.74 mg/dL (ref 0.20–1.20)
Total Protein: 6.5 g/dL (ref 6.4–8.3)

## 2014-12-10 NOTE — Progress Notes (Signed)
FMLA papers for patient's husband placed in Raquel's inbox per her request.

## 2014-12-10 NOTE — Telephone Encounter (Signed)
Gave avs & calendar for June. Sent message to adjust treatment

## 2014-12-10 NOTE — Progress Notes (Signed)
Patient Care Team: Olga Millers, MD as PCP - General (Obstetrics and Gynecology)  DIAGNOSIS: No matching staging information was found for the patient.  SUMMARY OF ONCOLOGIC HISTORY:   Cancer of central portion of right female breast   11/14/2014 Initial Diagnosis Right breast biopsy 8:30 position: Invasive ductal carcinoma with DCIS, grade 2, ER 0%, PR 0%, HER-2 positive ratio 3.53   11/20/2014 Breast MRI Right breast: 3.7 x 2.8 x 1.7 cm area of non-mass enhancement and retroareolar right breast into the lower outer quadrant and lower inner quadrant extending into the nipple involving anterior two thirds of the breast, no lymph nodes   12/04/2014 -  Neo-Adjuvant Chemotherapy Taxotere, carboplatin, Herceptin, Perjeta 6 cycles followed by Herceptin maintenance for 1 year    CHIEF COMPLIANT: cycle 1 day 8 TC H Perjeta  INTERVAL HISTORY: Robin Miles is a 39 year old with above-mentioned history of right-sided breast cancer currently on neoadjuvant chemotherapy. Today cycle 1 day 8. She tolerated cycle one extremely well. She did have nausea during chemotherapy which resolved after getting Aloxi. She did not have any further issues with nausea. She felt fatigued over the weekend but today she is feeling a lot better. On Saturday she also had a couple episodes of diarrhea for which she took Imodium which resolved her diarrhea. She did take Claritin but developed mild bone pain especially in the ribs and the back.  REVIEW OF SYSTEMS:   Constitutional: Denies fevers, chills or abnormal weight loss Eyes: Denies blurriness of vision Ears, nose, mouth, throat, and face: Denies mucositis or sore throat Respiratory: Denies cough, dyspnea or wheezes Cardiovascular: Denies palpitation, chest discomfort or lower extremity swelling Gastrointestinal:  Denies nausea, heartburn or change in bowel habits Skin: Denies abnormal skin rashes Lymphatics: Denies new lymphadenopathy or easy  bruising Neurological:Denies numbness, tingling or new weaknesses Behavioral/Psych: Mood is stable, no new changes   All other systems were reviewed with the patient and are negative.  I have reviewed the past medical history, past surgical history, social history and family history with the patient and they are unchanged from previous note.  ALLERGIES:  has No Known Allergies.  MEDICATIONS:  Current Outpatient Prescriptions  Medication Sig Dispense Refill  . ALPRAZolam (XANAX) 0.5 MG tablet     . dexamethasone (DECADRON) 4 MG tablet Take 1 tablet (4 mg total) by mouth 2 (two) times daily. Start the day before Taxotere. Then again the day after chemo for 3 days. 30 tablet 1  . HYDROcodone-acetaminophen (NORCO/VICODIN) 5-325 MG per tablet Take 1-2 tablets by mouth every 4 (four) hours as needed for moderate pain or severe pain. 25 tablet 0  . lidocaine-prilocaine (EMLA) cream Apply to affected area once 30 g 3  . lidocaine-prilocaine (EMLA) cream Apply to affected area once 30 g 3  . LORazepam (ATIVAN) 0.5 MG tablet Take 1 tablet (0.5 mg total) by mouth at bedtime. 30 tablet 0  . ondansetron (ZOFRAN) 8 MG tablet Take 1 tablet (8 mg total) by mouth 2 (two) times daily. Start the day after chemo for 3 days. Then take as needed for nausea or vomiting. 30 tablet 1  . prochlorperazine (COMPAZINE) 10 MG tablet Take 1 tablet (10 mg total) by mouth every 6 (six) hours as needed (Nausea or vomiting). 30 tablet 1  . UNABLE TO FIND 1 each by Other route once. Dispense cranial prosthesis per medical necessity for alopecia due to chemotherapy secondary to known breast cancer diagnosis. 1 each 0   No current facility-administered  medications for this visit.    PHYSICAL EXAMINATION: ECOG PERFORMANCE STATUS: 1 - Symptomatic but completely ambulatory  Filed Vitals:   12/10/14 1023  BP: 101/53  Pulse: 90  Temp: 97.5 F (36.4 C)  Resp: 18   Filed Weights   12/10/14 1023  Weight: 112 lb 6.4 oz  (50.984 kg)    GENERAL:alert, no distress and comfortable SKIN: skin color, texture, turgor are normal, no rashes or significant lesions EYES: normal, Conjunctiva are pink and non-injected, sclera clear OROPHARYNX:no exudate, no erythema and lips, buccal mucosa, and tongue normal  NECK: supple, thyroid normal size, non-tender, without nodularity LYMPH:  no palpable lymphadenopathy in the cervical, axillary or inguinal LUNGS: clear to auscultation and percussion with normal breathing effort HEART: regular rate & rhythm and no murmurs and no lower extremity edema ABDOMEN:abdomen soft, non-tender and normal bowel sounds Musculoskeletal:no cyanosis of digits and no clubbing  NEURO: alert & oriented x 3 with fluent speech, no focal motor/sensory deficits  LABORATORY DATA:  I have reviewed the data as listed   Chemistry      Component Value Date/Time   NA 140 12/10/2014 0956   K 4.1 12/10/2014 0956   CO2 25 12/10/2014 0956   BUN 14.2 12/10/2014 0956   CREATININE 1.0 12/10/2014 0956      Component Value Date/Time   CALCIUM 9.4 12/10/2014 0956   ALKPHOS 74 12/10/2014 0956   AST 15 12/10/2014 0956   ALT 18 12/10/2014 0956   BILITOT 0.74 12/10/2014 0956       Lab Results  Component Value Date   WBC 5.6 12/10/2014   HGB 14.1 12/10/2014   HCT 41.3 12/10/2014   MCV 86.4 12/10/2014   PLT 195 12/10/2014   NEUTROABS 2.3 12/10/2014    ASSESSMENT & PLAN:   Cancer of central portion of right female breast Right breast retroareolar mass 3.7 x 2.8 x 1.7 cm involving anterior two thirds of the breast, T2 N0 M0 stage II a clinical stage, ER 0%, appears 0%, HER-2 positive ratio 3.53  Current Treatment: TCHP today is cycle 1 day 8 Chemo monitoring: 1. Diarrhea: Responded to Imodium 2. Fatigue over the weekend 3. Nausea during chemotherapy improved with Aloxi Monitoring closely for chemotherapy side effects ECHO 55-60% EF  Blood counts were reviewed and they are adequate for  treatment RTC in 2 week for cycle 2  No orders of the defined types were placed in this encounter.   The patient has a good understanding of the overall plan. she agrees with it. she will call with any problems that may develop before the next visit here.   Rulon Eisenmenger, MD

## 2014-12-10 NOTE — Telephone Encounter (Signed)
Per staff message and POF I have scheduled appts. Advised scheduler of appts. JMW  

## 2014-12-12 ENCOUNTER — Encounter: Payer: Self-pay | Admitting: Hematology and Oncology

## 2014-12-12 NOTE — Progress Notes (Signed)
I placed fmla forms for hubby james on the desk of nurse for dr. Lindi Adie

## 2014-12-12 NOTE — Progress Notes (Signed)
I faxed fmla forms for hubby james to [id number] [phone]

## 2014-12-13 ENCOUNTER — Other Ambulatory Visit: Payer: Self-pay | Admitting: *Deleted

## 2014-12-13 DIAGNOSIS — C50111 Malignant neoplasm of central portion of right female breast: Secondary | ICD-10-CM

## 2014-12-13 MED ORDER — MAGIC MOUTHWASH W/LIDOCAINE
ORAL | Status: DC
Start: 2014-12-13 — End: 2015-04-12

## 2014-12-13 NOTE — Telephone Encounter (Signed)
Received call from patient stating her mouth was red and sore.  She denies any mouth sores or blisters, just some discomfort.  Instructed her to call if she develops a white coating on her tongue or mouth or any other symptoms.  Called in [place] for her at her pharmacy.  Encouraged her to call with any other needs or concerns.

## 2014-12-19 ENCOUNTER — Encounter: Payer: Self-pay | Admitting: Genetic Counselor

## 2014-12-19 ENCOUNTER — Telehealth: Payer: Self-pay | Admitting: Genetic Counselor

## 2014-12-19 DIAGNOSIS — Z1379 Encounter for other screening for genetic and chromosomal anomalies: Secondary | ICD-10-CM | POA: Insufficient documentation

## 2014-12-19 NOTE — Telephone Encounter (Signed)
LM on VM that results were back and to please call to go through them.

## 2014-12-20 ENCOUNTER — Telehealth: Payer: Self-pay | Admitting: Genetic Counselor

## 2014-12-20 NOTE — Telephone Encounter (Signed)
Revealed positive PALB2 results.  Had a long discussion about cancer risks, how we screen for cancer in people with PALB2 mutations, and whether she should consider having her ovaries out.  We discussed that guidelines do not suggest this, and she does not have a family history of ovarian cancer, so we would not make this recommendation. However, she could also discuss this with Dr. Gudena or her GYN as well. We scheduled her for a return visit for June 3 at 10. 

## 2014-12-21 ENCOUNTER — Ambulatory Visit (HOSPITAL_BASED_OUTPATIENT_CLINIC_OR_DEPARTMENT_OTHER): Payer: BLUE CROSS/BLUE SHIELD | Admitting: Genetic Counselor

## 2014-12-21 ENCOUNTER — Encounter: Payer: Self-pay | Admitting: Genetic Counselor

## 2014-12-21 DIAGNOSIS — Z1589 Genetic susceptibility to other disease: Secondary | ICD-10-CM

## 2014-12-21 DIAGNOSIS — Z315 Encounter for genetic counseling: Secondary | ICD-10-CM

## 2014-12-21 DIAGNOSIS — Z1509 Genetic susceptibility to other malignant neoplasm: Secondary | ICD-10-CM

## 2014-12-21 DIAGNOSIS — Z803 Family history of malignant neoplasm of breast: Secondary | ICD-10-CM | POA: Diagnosis not present

## 2014-12-21 DIAGNOSIS — C50919 Malignant neoplasm of unspecified site of unspecified female breast: Secondary | ICD-10-CM

## 2014-12-21 DIAGNOSIS — Z1379 Encounter for other screening for genetic and chromosomal anomalies: Secondary | ICD-10-CM

## 2014-12-21 DIAGNOSIS — C50111 Malignant neoplasm of central portion of right female breast: Secondary | ICD-10-CM

## 2014-12-21 DIAGNOSIS — Z1502 Genetic susceptibility to malignant neoplasm of ovary: Secondary | ICD-10-CM

## 2014-12-21 NOTE — Progress Notes (Signed)
REFERRING PROVIDER: Olga Millers, MD [address] [place], [place] [address]   Nicholas Lose, MD  PRIMARY PROVIDER:  Olga Millers, MD  PRIMARY REASON FOR VISIT:  1. Family history of breast cancer   2. Cancer of central portion of right female breast   3. PALB2-related breast cancer   4. Genetic testing      HISTORY OF PRESENT ILLNESS:   Ms. Wahid, a 39 y.o. female, was seen for genetics consultation to discuss her genetic test results.  Genetic testing performed at Ms. 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  Ms. Elgin presents to clinic today to discuss the possibility of a hereditary predisposition to cancer, genetic testing, and to further clarify her future cancer risks, as well as potential cancer risks for family members. She was accompanied to clinic by her mother, Coralee Rud.  CANCER HISTORY:    Cancer of central portion of right female breast   11/14/2014 Initial Diagnosis Right breast biopsy 8:30 position: Invasive ductal carcinoma with DCIS, grade 2, ER 0%, PR 0%, HER-2 positive ratio 3.53   11/20/2014 Breast MRI Right breast: 3.7 x 2.8 x 1.7 cm area of non-mass enhancement and retroareolar right breast into the lower outer quadrant and lower inner quadrant extending into the nipple involving anterior two thirds of the breast, no lymph nodes   12/04/2014 -  Neo-Adjuvant Chemotherapy Taxotere, carboplatin, Herceptin, Perjeta 6 cycles followed by Herceptin maintenance for 1 year   12/19/2014 Procedure PALB2 Mutation     Past Medical History  Diagnosis Date  . Breast cancer, right breast   . Breast cancer 2016    ER-/PR-/Her2+  . Family history of breast cancer   . PALB2-related breast cancer     Past Surgical History  Procedure Laterality Date  . Tonsilectomy/adenoidectomy with myringotomy    . Tubal ligation    . Knee reconstruction Right   . Tonsillectomy    . Portacath placement N/A 11/30/2014    Procedure: INSERTION  PORT-A-CATH;  Surgeon: Excell Seltzer, MD;  Location: [facility];  Service: General;  Laterality: N/A;    History   Social History  . Marital Status: Married    Spouse Name: N/A  . Number of Children: 6  . Years of Education: N/A   Occupational History  . RN at [employer] History Main Topics  . Smoking status: Never Smoker   . Smokeless tobacco: Never Used  . Alcohol Use: No  . Drug Use: No  . Sexual Activity: Yes    Birth Control/ Protection: Surgical   Other Topics Concern  . None   Social History Narrative     FAMILY HISTORY:  We obtained a detailed, 4-generation family history.  Significant diagnoses are listed below: Family History  Problem Relation Age of Onset  . Breast cancer Maternal Aunt     dx in her late 63s  . Breast cancer Paternal Grandmother     dx in her 37s  . Breast cancer Cousin     dx late 27s; BRCA-  . Cancer Cousin     dx in her 84s; leiomyosarcoma   The patient has four daughters and two sons. She also has a fraternal twin sister and a brother - all who are cancer free. Ms. Mccune mother is 35 and had a TAH, but she is unsure if a BSO was performed, in her 11s. Her mother has 7 brothers and one sister. Her sister had  breast cancer in her 57s and had a daughter who had breast cancer in her late 69s. Ms. Tedra [place] cousin had genetic testing, and reportedly was negative for BRCA mutations. Ms Degraffenreid has another cousin who was diagnosed and died of leimyosarcoma in her 16s. Ms. Tedra [place] father has one sister who is cancer free. His mother had breast cancer in her 64s. There is no other reported family history of cancer. Patient's ancestors are of Causasian descent. There is no reported Ashkenazi Jewish ancestry. There is no known consanguinity.  GENETIC COUNSELING ASSESSMENT: AMELIE HOLLARS is a 39 y.o. 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 We, therefore, discussed and recommended the following at today's visit.   DISCUSSION: We reviewed autosomal dominant inheritance, natural history of hereditary breast/pancreatic cancer syndrome, and current NCCN medical management guidelines for individuals with an increased risk for breast cancer.  The patient was provided with a packet of support resources for individuals with a hereditary cancer syndrome, including information about the [employer] support organization.  We discussed that [place] is thought to be a moderate risk gene, and to date most of the literature conveys a risk for breast cancer of about 25-58%.  We reviewed the risk for other cancers in carriers for PALB2 mutations, including including female breast cancer, prostate, ovarian and pancreatic cancer, although these risks have not yet been quantified. Current NCCN guidelines suggest that women with PALB2 mutations consider breast MRI in addition to their annual mammogram.  Additionally, a discussion about risk reducing bilateral mastectomy should be considered.  While there is an increased risk for other cancers, there are no current guidelines for screening for these cancers, and at this time NCCN does not suggest a discussion of risk reducing salpingo-oophorectomy.   Ms Myer's siblings should consider genetic testing as they are at 50% risk for having this same mutation.  Her children are too young to consider genetic testing.  When they are ready to be tested we recommend genetic counseling and testing through a genetics center in order to have the most up to date information presented to them.  Ms. Cheyney is very concerned about the risks for ovarian cancer and pancreatic cancer.  We discussed that neither of these cancers are in her family and therefore we would not feel that she is at a higher risk than anyone else with a PALB2 pathogenic mutation, but that we could not yet quantify  those risks.  We do not feel that her risk is high enough for ovarian cancer to recommend having her ovaries out and that she is not at high enough risk for pancreatic cancer to refer her for pancreatic screening.  She is having a hard time reconciling her risk for these cancers because of the lack of risk percentages.  I voiced my understanding that this is not satisfying, but that we may know more in the future.    Ms. Spurr was referred to the [facility] through [facility] in [place].  They have had several PALB2 study's recruit through their registry.   PLAN: MS. Myer's mother is undergoing genetic testing at this time.  Ms. Keepers was given information about [employer] and [employer] support groups.  She will consider looking into these groups for individuals to talk to.   Based on Ms. Denbow's family history, we recommended her cousin, who was diagnosed with breast cancer  at age 82, have genetic counseling and testing. Ms. Garrow will let us know if we can be of any assistance in coordinating genetic counseling and/or testing for this family member.   Lastly, we encouraged Ms. Lein to remain in contact with cancer genetics annually so that we can continuously update the family history and inform her of any changes in cancer genetics and testing that may be of benefit for this family.   Ms.  Mcatee questions were answered to her satisfaction today. Our contact information was provided should additional questions or concerns arise. Thank you for the referral and allowing [place] to share in the care of your patient.   Karen P. Florene Glen, [place], [facility] Certified Genetic Counselor [email or web address] phone: [phone]  The patient was seen for a total of 90 minutes in face-to-face genetic counseling.  This patient was discussed with Drs. Magrinat, Lindi Adie and/or Burr Medico who agrees with the above.    _______________________________________________________________________ For Office Staff:   Number of people involved in session: 2 Was an Intern/ student involved with case: no

## 2014-12-25 ENCOUNTER — Ambulatory Visit: Payer: BLUE CROSS/BLUE SHIELD

## 2014-12-26 ENCOUNTER — Ambulatory Visit (HOSPITAL_BASED_OUTPATIENT_CLINIC_OR_DEPARTMENT_OTHER): Payer: BLUE CROSS/BLUE SHIELD

## 2014-12-26 ENCOUNTER — Telehealth: Payer: Self-pay | Admitting: Nurse Practitioner

## 2014-12-26 ENCOUNTER — Encounter: Payer: Self-pay | Admitting: Nurse Practitioner

## 2014-12-26 ENCOUNTER — Other Ambulatory Visit (HOSPITAL_BASED_OUTPATIENT_CLINIC_OR_DEPARTMENT_OTHER): Payer: BLUE CROSS/BLUE SHIELD

## 2014-12-26 ENCOUNTER — Encounter: Payer: Self-pay | Admitting: General Practice

## 2014-12-26 ENCOUNTER — Ambulatory Visit (HOSPITAL_BASED_OUTPATIENT_CLINIC_OR_DEPARTMENT_OTHER): Payer: BLUE CROSS/BLUE SHIELD | Admitting: Nurse Practitioner

## 2014-12-26 VITALS — BP 104/65 | HR 74 | Temp 97.9°F | Resp 18 | Ht 62.5 in | Wt 109.8 lb

## 2014-12-26 DIAGNOSIS — Z5111 Encounter for antineoplastic chemotherapy: Secondary | ICD-10-CM | POA: Diagnosis not present

## 2014-12-26 DIAGNOSIS — Z5189 Encounter for other specified aftercare: Secondary | ICD-10-CM | POA: Diagnosis not present

## 2014-12-26 DIAGNOSIS — C50111 Malignant neoplasm of central portion of right female breast: Secondary | ICD-10-CM | POA: Diagnosis not present

## 2014-12-26 DIAGNOSIS — Z5112 Encounter for antineoplastic immunotherapy: Secondary | ICD-10-CM

## 2014-12-26 DIAGNOSIS — Z171 Estrogen receptor negative status [ER-]: Secondary | ICD-10-CM | POA: Diagnosis not present

## 2014-12-26 LAB — CBC WITH DIFFERENTIAL/PLATELET
BASO%: 0.3 % (ref 0.0–2.0)
Basophils Absolute: 0 10*3/uL (ref 0.0–0.1)
EOS%: 0 % (ref 0.0–7.0)
Eosinophils Absolute: 0 10*3/uL (ref 0.0–0.5)
HCT: 37.2 % (ref 34.8–46.6)
HGB: 12.8 g/dL (ref 11.6–15.9)
LYMPH%: 16.6 % (ref 14.0–49.7)
MCH: 29.6 pg (ref 25.1–34.0)
MCHC: 34.4 g/dL (ref 31.5–36.0)
MCV: 85.9 fL (ref 79.5–101.0)
MONO#: 0.5 10*3/uL (ref 0.1–0.9)
MONO%: 5.2 % (ref 0.0–14.0)
NEUT%: 77.9 % — AB (ref 38.4–76.8)
NEUTROS ABS: 6.8 10*3/uL — AB (ref 1.5–6.5)
Platelets: 307 10*3/uL (ref 145–400)
RBC: 4.33 10*6/uL (ref 3.70–5.45)
RDW: 13 % (ref 11.2–14.5)
WBC: 8.7 10*3/uL (ref 3.9–10.3)
lymph#: 1.4 10*3/uL (ref 0.9–3.3)

## 2014-12-26 LAB — COMPREHENSIVE METABOLIC PANEL (CC13)
ALT: 12 U/L (ref 0–55)
AST: 12 U/L (ref 5–34)
Albumin: 4.4 g/dL (ref 3.5–5.0)
Alkaline Phosphatase: 63 U/L (ref 40–150)
Anion Gap: 11 mEq/L (ref 3–11)
BUN: 14.7 mg/dL (ref 7.0–26.0)
CO2: 26 mEq/L (ref 22–29)
CREATININE: 0.9 mg/dL (ref 0.6–1.1)
Calcium: 10.1 mg/dL (ref 8.4–10.4)
Chloride: 106 mEq/L (ref 98–109)
EGFR: 79 mL/min/{1.73_m2} — ABNORMAL LOW (ref >90)
Glucose: 125 mg/dl (ref 70–140)
Potassium: 4.2 mEq/L (ref 3.5–5.1)
SODIUM: 142 meq/L (ref 136–145)
TOTAL PROTEIN: 7.2 g/dL (ref 6.4–8.3)
Total Bilirubin: 1.3 mg/dL — ABNORMAL HIGH (ref 0.20–1.20)

## 2014-12-26 MED ORDER — DOCETAXEL CHEMO INJECTION 160 MG/16ML
75.0000 mg/m2 | Freq: Once | INTRAVENOUS | Status: AC
  Administered 2014-12-26: 110 mg via INTRAVENOUS
  Filled 2014-12-26: qty 11

## 2014-12-26 MED ORDER — DIPHENHYDRAMINE HCL 25 MG PO CAPS
50.0000 mg | ORAL_CAPSULE | Freq: Once | ORAL | Status: AC
  Administered 2014-12-26: 50 mg via ORAL

## 2014-12-26 MED ORDER — TRASTUZUMAB CHEMO INJECTION 440 MG
6.0000 mg/kg | Freq: Once | INTRAVENOUS | Status: AC
  Administered 2014-12-26: 315 mg via INTRAVENOUS
  Filled 2014-12-26: qty 15

## 2014-12-26 MED ORDER — HEPARIN SOD (PORK) LOCK FLUSH 100 UNIT/ML IV SOLN
500.0000 [IU] | Freq: Once | INTRAVENOUS | Status: AC | PRN
  Administered 2014-12-26: 500 [IU]
  Filled 2014-12-26: qty 5

## 2014-12-26 MED ORDER — PALONOSETRON HCL INJECTION 0.25 MG/5ML
INTRAVENOUS | Status: AC
  Filled 2014-12-26: qty 5

## 2014-12-26 MED ORDER — ACETAMINOPHEN 325 MG PO TABS
ORAL_TABLET | ORAL | Status: AC
  Filled 2014-12-26: qty 2

## 2014-12-26 MED ORDER — ACETAMINOPHEN 325 MG PO TABS
650.0000 mg | ORAL_TABLET | Freq: Once | ORAL | Status: AC
  Administered 2014-12-26: 650 mg via ORAL

## 2014-12-26 MED ORDER — SODIUM CHLORIDE 0.9 % IV SOLN
555.6000 mg | Freq: Once | INTRAVENOUS | Status: AC
  Administered 2014-12-26: 560 mg via INTRAVENOUS
  Filled 2014-12-26: qty 56

## 2014-12-26 MED ORDER — SODIUM CHLORIDE 0.9 % IV SOLN
Freq: Once | INTRAVENOUS | Status: AC
  Administered 2014-12-26: 10:00:00 via INTRAVENOUS

## 2014-12-26 MED ORDER — PERTUZUMAB CHEMO INJECTION 420 MG/14ML
420.0000 mg | Freq: Once | INTRAVENOUS | Status: AC
  Administered 2014-12-26: 420 mg via INTRAVENOUS
  Filled 2014-12-26: qty 14

## 2014-12-26 MED ORDER — PEGFILGRASTIM 6 MG/0.6ML ~~LOC~~ PSKT
6.0000 mg | PREFILLED_SYRINGE | Freq: Once | SUBCUTANEOUS | Status: AC
  Administered 2014-12-26: 6 mg via SUBCUTANEOUS
  Filled 2014-12-26: qty 0.6

## 2014-12-26 MED ORDER — DIPHENHYDRAMINE HCL 25 MG PO CAPS
ORAL_CAPSULE | ORAL | Status: AC
  Filled 2014-12-26: qty 2

## 2014-12-26 MED ORDER — SODIUM CHLORIDE 0.9 % IV SOLN
Freq: Once | INTRAVENOUS | Status: AC
  Administered 2014-12-26: 11:00:00 via INTRAVENOUS
  Filled 2014-12-26: qty 1

## 2014-12-26 MED ORDER — SODIUM CHLORIDE 0.9 % IJ SOLN
10.0000 mL | INTRAMUSCULAR | Status: DC | PRN
  Administered 2014-12-26: 10 mL
  Filled 2014-12-26: qty 10

## 2014-12-26 MED ORDER — PALONOSETRON HCL INJECTION 0.25 MG/5ML
0.2500 mg | Freq: Once | INTRAVENOUS | Status: AC
  Administered 2014-12-26: 0.25 mg via INTRAVENOUS

## 2014-12-26 NOTE — Progress Notes (Signed)
Patient Care Team: Olga Millers, MD as PCP - General (Obstetrics and Gynecology)  DIAGNOSIS: No matching staging information was found for the patient.  SUMMARY OF ONCOLOGIC HISTORY:   Cancer of central portion of right female breast   11/14/2014 Initial Diagnosis Right breast biopsy 8:30 position: Invasive ductal carcinoma with DCIS, grade 2, ER 0%, PR 0%, HER-2 positive ratio 3.53   11/20/2014 Breast MRI Right breast: 3.7 x 2.8 x 1.7 cm area of non-mass enhancement and retroareolar right breast into the lower outer quadrant and lower inner quadrant extending into the nipple involving anterior two thirds of the breast, no lymph nodes   12/04/2014 -  Neo-Adjuvant Chemotherapy Taxotere, carboplatin, Herceptin, Perjeta 6 cycles followed by Herceptin maintenance for 1 year   12/19/2014 Procedure PALB2 Mutation    CHIEF COMPLIANT: cycle 2 day 1 TCH Perjeta  INTERVAL HISTORY: Robin Miles is a 39 year old with above-mentioned history of right-sided breast cancer currently on neoadjuvant chemotherapy. Today cycle 2 day 1. Kasiyah is very anxious today. She has spent a lot of time on the internet and it has "freaked her out" learning that she is PALB2 positive. She wonders if she has ovarian or pancreatic cancers as well now. She plans to have bilateral mastectomies and will remove both ovaries. She has had a small cyst on her left ovary for some time but is now paranoid about it. She had bilateral rib and back pain after the neulasta injection and now wonders if this is cancer.   REVIEW OF SYSTEMS:   Constitutional: Denies fevers, chills or abnormal weight loss Eyes: Denies blurriness of vision Ears, nose, mouth, throat, and face: Denies mucositis or sore throat Respiratory: Denies cough, dyspnea or wheezes Cardiovascular: Denies palpitation, chest discomfort or lower extremity swelling Gastrointestinal:  Denies nausea, heartburn or change in bowel habits Skin: Denies abnormal skin rashes Lymphatics:  Denies new lymphadenopathy or easy bruising Neurological:Denies numbness, tingling or new weaknesses Behavioral/Psych: Mood is stable, no new changes   All other systems were reviewed with the patient and are negative.  I have reviewed the past medical history, past surgical history, social history and family history with the patient and they are unchanged from previous note.  ALLERGIES:  has No Known Allergies.  MEDICATIONS:  Current Outpatient Prescriptions  Medication Sig Dispense Refill  . ALPRAZolam (XANAX) 0.5 MG tablet     . dexamethasone (DECADRON) 4 MG tablet Take 1 tablet (4 mg total) by mouth 2 (two) times daily. Start the day before Taxotere. Then again the day after chemo for 3 days. 30 tablet 1  . lidocaine-prilocaine (EMLA) cream Apply to affected area once 30 g 3  . LORazepam (ATIVAN) 0.5 MG tablet Take 1 tablet (0.5 mg total) by mouth at bedtime. 30 tablet 0  . Alum & Mag Hydroxide-Simeth (MAGIC MOUTHWASH W/LIDOCAINE) SOLN 5-3m  Four times daily as needed for mouth discomfort (Patient not taking: Reported on 12/26/2014) 240 mL 1  . HYDROcodone-acetaminophen (NORCO/VICODIN) 5-325 MG per tablet Take 1-2 tablets by mouth every 4 (four) hours as needed for moderate pain or severe pain. (Patient not taking: Reported on 12/26/2014) 25 tablet 0  . ondansetron (ZOFRAN) 8 MG tablet Take 1 tablet (8 mg total) by mouth 2 (two) times daily. Start the day after chemo for 3 days. Then take as needed for nausea or vomiting. (Patient not taking: Reported on 12/26/2014) 30 tablet 1  . prochlorperazine (COMPAZINE) 10 MG tablet Take 1 tablet (10 mg total) by mouth every 6 (  six) hours as needed (Nausea or vomiting). (Patient not taking: Reported on 12/26/2014) 30 tablet 1  . UNABLE TO FIND 1 each by Other route once. Dispense cranial prosthesis per medical necessity for alopecia due to chemotherapy secondary to known breast cancer diagnosis. (Patient not taking: Reported on 12/26/2014) 1 each 0   No  current facility-administered medications for this visit.   Facility-Administered Medications Ordered in Other Visits  Medication Dose Route Frequency Provider Last Rate Last Dose  . sodium chloride 0.9 % injection 10 mL  10 mL Intracatheter PRN Nicholas Lose, MD   10 mL at 12/26/14 1410    PHYSICAL EXAMINATION: ECOG PERFORMANCE STATUS: 1 - Symptomatic but completely ambulatory  Filed Vitals:   12/26/14 0909  BP: 104/65  Pulse: 74  Temp: 97.9 F (36.6 C)  Resp: 18   Filed Weights   12/26/14 0909  Weight: 109 lb 12.8 oz (49.805 kg)    GENERAL:alert, no distress and comfortable SKIN: skin color, texture, turgor are normal, no rashes or significant lesions EYES: normal, Conjunctiva are pink and non-injected, sclera clear OROPHARYNX:no exudate, no erythema and lips, buccal mucosa, and tongue normal  NECK: supple, thyroid normal size, non-tender, without nodularity LYMPH:  no palpable lymphadenopathy in the cervical, axillary or inguinal LUNGS: clear to auscultation and percussion with normal breathing effort HEART: regular rate & rhythm and no murmurs and no lower extremity edema ABDOMEN:abdomen soft, non-tender and normal bowel sounds Musculoskeletal:no cyanosis of digits and no clubbing  NEURO: alert & oriented x 3 with fluent speech, no focal motor/sensory deficits  LABORATORY DATA:  I have reviewed the data as listed   Chemistry      Component Value Date/Time   NA 142 12/26/2014 0840   K 4.2 12/26/2014 0840   CO2 26 12/26/2014 0840   BUN 14.7 12/26/2014 0840   CREATININE 0.9 12/26/2014 0840      Component Value Date/Time   CALCIUM 10.1 12/26/2014 0840   ALKPHOS 63 12/26/2014 0840   AST 12 12/26/2014 0840   ALT 12 12/26/2014 0840   BILITOT 1.30* 12/26/2014 0840       Lab Results  Component Value Date   WBC 8.7 12/26/2014   HGB 12.8 12/26/2014   HCT 37.2 12/26/2014   MCV 85.9 12/26/2014   PLT 307 12/26/2014   NEUTROABS 6.8* 12/26/2014    ASSESSMENT &  PLAN:   Cancer of central portion of right female breast Right breast retroareolar mass 3.7 x 2.8 x 1.7 cm involving anterior two thirds of the breast, T2 N0 M0 stage II a clinical stage, ER 0%, appears 0%, HER-2 positive ratio 3.53  Current Treatment: TCHP today is cycle 2 day 1 Chemo monitoring: 1. Diarrhea: Responded to Imodium 2. Fatigue over the weekend 3. Nausea during chemotherapy improved with Aloxi 4. Bone pain from neulasta: recommended claritin and NSAIDs PRN 5. Anxiety related to PALB2 mutation: offered CT scans of chest abdomen and pelvis to soothe fears, patient will think about this 6. Abnormal bilirubin:  Her bili was just mildly elevated to 1.3. Consulted with Dr. Jana Hakim and received permission to treat at current dose. Will review with next lab draw.  Monitoring closely for chemotherapy side effects ECHO 55-60% EF  RTC in 1 week for a nadir check  Orders Placed This Encounter  Procedures  . Vitamin D 25 hydroxy    Standing Status: Future     Number of Occurrences:      Standing Expiration Date: 12/26/2015   The patient has  a good understanding of the overall plan. she agrees with it. she will call with any problems that may develop before the next visit here.   Laurie Panda, NP

## 2014-12-26 NOTE — Progress Notes (Signed)
Bili increased but remains within treatment parameter; OK to treat per Gentry Fitz, NP.

## 2014-12-26 NOTE — Telephone Encounter (Signed)
Appointments made and avs will be printed in chemo  anne

## 2014-12-26 NOTE — Progress Notes (Signed)
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  They report support from family/community/church, including her mom, who lives five minutes away and often helps with their six children (ages 32-15, with two sets of twins).  Per pt, her biggest stress is worry that she will develop other types of cancer (such as ovarian and pancreatic); we talked about her physicians' reassurances and tools for coping when worry begins to set in.  Per pt, her anxiety about her current dx is somewhat mitigated by having experienced a chemo treatment and the recovery time she needed afterward; beginning to move from "unknown" to "known" feels empowering.  Provided pastoral presence, reflective listening, normalization of feelings, and assistance with processing and maintaining perspective.  Following for support, but please also page as needs arise.  Thank you.  [place], [place]

## 2014-12-26 NOTE — Patient Instructions (Signed)
[facility] Discharge Instructions for Patients Receiving Chemotherapy  Today you received the following chemotherapy agents :  Herceptin, Perjeta, Taxotere, Carboplatin.  To help prevent nausea and vomiting after your treatment, we encourage you to take your nausea medication as prescribed.   If you develop nausea and vomiting that is not controlled by your nausea medication, call the clinic.   BELOW ARE SYMPTOMS THAT SHOULD BE REPORTED IMMEDIATELY:  *FEVER GREATER THAN 100.5 F  *CHILLS WITH OR WITHOUT FEVER  NAUSEA AND VOMITING THAT IS NOT CONTROLLED WITH YOUR NAUSEA MEDICATION  *UNUSUAL SHORTNESS OF BREATH  *UNUSUAL BRUISING OR BLEEDING  TENDERNESS IN MOUTH AND THROAT WITH OR WITHOUT PRESENCE OF ULCERS  *URINARY PROBLEMS  *BOWEL PROBLEMS  UNUSUAL RASH Items with * indicate a potential emergency and should be followed up as soon as possible.  Feel free to call the clinic you have any questions or concerns. The clinic phone number is [phone].  Please show the CHEMO ALERT CARD at check-in to the Emergency Department and triage nurse.   

## 2014-12-31 ENCOUNTER — Telehealth: Payer: Self-pay | Admitting: *Deleted

## 2014-12-31 ENCOUNTER — Other Ambulatory Visit: Payer: Self-pay | Admitting: *Deleted

## 2014-12-31 ENCOUNTER — Encounter: Payer: Self-pay | Admitting: *Deleted

## 2014-12-31 DIAGNOSIS — C50111 Malignant neoplasm of central portion of right female breast: Secondary | ICD-10-CM

## 2014-12-31 MED ORDER — VENLAFAXINE HCL ER 37.5 MG PO CP24: 37.5000 mg | ORAL_CAPSULE | Freq: Every day | ORAL | Status: DC

## 2014-12-31 NOTE — Telephone Encounter (Signed)
Received call from patient's husband Gerald Stabs asking if would be helpful if patient would benefit from an antidepressant.  He states she is fine sometimes and then when she has time alone and to think she starts thinking about pain in her side and that it could be pancreatic cancer and what if she has cancer in other places of her body. He states she does take ativan at night but then wakes up and starts thinking about everything else.  Informed him I would talk with Nira Conn as Dr. Lindi Adie is out of town and that a CT scan may be the only thing to put her mind at rest.  Nira Conn will discuss on Wednesday and I will call in Effexor XR 37.5mg  to her pharmacy.

## 2015-01-01 ENCOUNTER — Ambulatory Visit (HOSPITAL_BASED_OUTPATIENT_CLINIC_OR_DEPARTMENT_OTHER): Payer: BLUE CROSS/BLUE SHIELD | Admitting: Nurse Practitioner

## 2015-01-01 ENCOUNTER — Other Ambulatory Visit (HOSPITAL_BASED_OUTPATIENT_CLINIC_OR_DEPARTMENT_OTHER): Payer: BLUE CROSS/BLUE SHIELD

## 2015-01-01 VITALS — BP 105/59 | HR 84 | Temp 98.1°F | Resp 18 | Ht 62.5 in | Wt 110.1 lb

## 2015-01-01 DIAGNOSIS — Z1589 Genetic susceptibility to other disease: Secondary | ICD-10-CM

## 2015-01-01 DIAGNOSIS — Z1509 Genetic susceptibility to other malignant neoplasm: Secondary | ICD-10-CM

## 2015-01-01 DIAGNOSIS — C50111 Malignant neoplasm of central portion of right female breast: Secondary | ICD-10-CM

## 2015-01-01 DIAGNOSIS — C50919 Malignant neoplasm of unspecified site of unspecified female breast: Secondary | ICD-10-CM

## 2015-01-01 DIAGNOSIS — R109 Unspecified abdominal pain: Secondary | ICD-10-CM

## 2015-01-01 DIAGNOSIS — F419 Anxiety disorder, unspecified: Secondary | ICD-10-CM | POA: Diagnosis not present

## 2015-01-01 DIAGNOSIS — Z1502 Genetic susceptibility to malignant neoplasm of ovary: Secondary | ICD-10-CM

## 2015-01-01 LAB — COMPREHENSIVE METABOLIC PANEL (CC13)
ALK PHOS: 80 U/L (ref 40–150)
ALT: 16 U/L (ref 0–55)
AST: 10 U/L (ref 5–34)
Albumin: 3.9 g/dL (ref 3.5–5.0)
Anion Gap: 13 mEq/L — ABNORMAL HIGH (ref 3–11)
BUN: 12.2 mg/dL (ref 7.0–26.0)
CALCIUM: 9.4 mg/dL (ref 8.4–10.4)
CO2: 27 mEq/L (ref 22–29)
CREATININE: 0.9 mg/dL (ref 0.6–1.1)
Chloride: 101 mEq/L (ref 98–109)
EGFR: 81 mL/min/{1.73_m2} — ABNORMAL LOW (ref >90)
GLUCOSE: 111 mg/dL (ref 70–140)
Potassium: 3.8 mEq/L (ref 3.5–5.1)
SODIUM: 141 meq/L (ref 136–145)
Total Bilirubin: 1.03 mg/dL (ref 0.20–1.20)
Total Protein: 6.4 g/dL (ref 6.4–8.3)

## 2015-01-01 LAB — CBC WITH DIFFERENTIAL/PLATELET
BASO%: 0.3 % (ref 0.0–2.0)
BASOS ABS: 0 10*3/uL (ref 0.0–0.1)
EOS ABS: 0.1 10*3/uL (ref 0.0–0.5)
EOS%: 1.7 % (ref 0.0–7.0)
HEMATOCRIT: 36.2 % (ref 34.8–46.6)
HGB: 12.5 g/dL (ref 11.6–15.9)
LYMPH%: 63.7 % — AB (ref 14.0–49.7)
MCH: 29.9 pg (ref 25.1–34.0)
MCHC: 34.5 g/dL (ref 31.5–36.0)
MCV: 86.6 fL (ref 79.5–101.0)
MONO#: 0.3 10*3/uL (ref 0.1–0.9)
MONO%: 9.7 % (ref 0.0–14.0)
NEUT%: 24.6 % — AB (ref 38.4–76.8)
NEUTROS ABS: 0.7 10*3/uL — AB (ref 1.5–6.5)
PLATELETS: 122 10*3/uL — AB (ref 145–400)
RBC: 4.18 10*6/uL (ref 3.70–5.45)
RDW: 13.3 % (ref 11.2–14.5)
WBC: 3 10*3/uL — ABNORMAL LOW (ref 3.9–10.3)
lymph#: 1.9 10*3/uL (ref 0.9–3.3)

## 2015-01-01 NOTE — Progress Notes (Signed)
Patient Care Team: Olga Millers, MD as PCP - General (Obstetrics and Gynecology)  DIAGNOSIS: No matching staging information was found for the patient.  SUMMARY OF ONCOLOGIC HISTORY:   Cancer of central portion of right female breast   11/14/2014 Initial Diagnosis Right breast biopsy 8:30 position: Invasive ductal carcinoma with DCIS, grade 2, ER 0%, PR 0%, HER-2 positive ratio 3.53   11/20/2014 Breast MRI Right breast: 3.7 x 2.8 x 1.7 cm area of non-mass enhancement and retroareolar right breast into the lower outer quadrant and lower inner quadrant extending into the nipple involving anterior two thirds of the breast, no lymph nodes   12/04/2014 -  Neo-Adjuvant Chemotherapy Taxotere, carboplatin, Herceptin, Perjeta 6 cycles followed by Herceptin maintenance for 1 year   12/19/2014 Procedure PALB2 Mutation    CHIEF COMPLIANT: cycle 2 day 7 TCH Perjeta  INTERVAL HISTORY: Robin Miles is a 39 year old with above-mentioned history of right-sided breast cancer currently on neoadjuvant chemotherapy. Today cycle 2 day 7. Jolynda tolerated her second cycle much better than the first. She felt better prepared, and was more equipped to treat her side effects. She denies fevers or chills. Her nausea was minimal. She had less diarrhea, since initiating the imodium better. She is eating and drinking well. She had less bone pain with the ibuprofen and claritin. She has no mouth sores or rashes. Unfortunately her anxiety continues. She wishes she was never told about the PALB2 mutation. Since people with this mutation have greater occurrences of pancreatic cancer, she has worried herself constantly that she has this cancer as well. She has no evidence beside a mild twinge of pain to her upper left quadrant. It is only present she she "thinks" about it however. And she has palpated the area so much herself that any original tenderness is now advanced because of this action.   REVIEW OF SYSTEMS:   Constitutional:  Denies fevers, chills or abnormal weight loss Eyes: Denies blurriness of vision Ears, nose, mouth, throat, and face: Denies mucositis or sore throat Respiratory: Denies cough, dyspnea or wheezes Cardiovascular: Denies palpitation, chest discomfort or lower extremity swelling Gastrointestinal:  Denies nausea, heartburn or change in bowel habits Skin: Denies abnormal skin rashes Lymphatics: Denies new lymphadenopathy or easy bruising Neurological:Denies numbness, tingling or new weaknesses Behavioral/Psych: Mood is stable, no new changes   All other systems were reviewed with the patient and are negative.  I have reviewed the past medical history, past surgical history, social history and family history with the patient and they are unchanged from previous note.  ALLERGIES:  has No Known Allergies.  MEDICATIONS:  Current Outpatient Prescriptions  Medication Sig Dispense Refill  . dexamethasone (DECADRON) 4 MG tablet Take 1 tablet (4 mg total) by mouth 2 (two) times daily. Start the day before Taxotere. Then again the day after chemo for 3 days. 30 tablet 1  . lidocaine-prilocaine (EMLA) cream Apply to affected area once 30 g 3  . LORazepam (ATIVAN) 0.5 MG tablet Take 1 tablet (0.5 mg total) by mouth at bedtime. 30 tablet 0  . venlafaxine XR (EFFEXOR-XR) 37.5 MG 24 hr capsule Take 1 capsule (37.5 mg total) by mouth daily with breakfast. 30 capsule 1  . ALPRAZolam (XANAX) 0.5 MG tablet     . Alum & Mag Hydroxide-Simeth (MAGIC MOUTHWASH W/LIDOCAINE) SOLN 5-50m  Four times daily as needed for mouth discomfort (Patient not taking: Reported on 12/26/2014) 240 mL 1  . HYDROcodone-acetaminophen (NORCO/VICODIN) 5-325 MG per tablet Take 1-2 tablets by  mouth every 4 (four) hours as needed for moderate pain or severe pain. (Patient not taking: Reported on 12/26/2014) 25 tablet 0  . ondansetron (ZOFRAN) 8 MG tablet Take 1 tablet (8 mg total) by mouth 2 (two) times daily. Start the day after chemo for 3  days. Then take as needed for nausea or vomiting. (Patient not taking: Reported on 12/26/2014) 30 tablet 1  . prochlorperazine (COMPAZINE) 10 MG tablet Take 1 tablet (10 mg total) by mouth every 6 (six) hours as needed (Nausea or vomiting). (Patient not taking: Reported on 12/26/2014) 30 tablet 1  . UNABLE TO FIND 1 each by Other route once. Dispense cranial prosthesis per medical necessity for alopecia due to chemotherapy secondary to known breast cancer diagnosis. (Patient not taking: Reported on 12/26/2014) 1 each 0   No current facility-administered medications for this visit.    PHYSICAL EXAMINATION: ECOG PERFORMANCE STATUS: 1 - Symptomatic but completely ambulatory  Filed Vitals:   01/01/15 1350  BP: 105/59  Pulse: 84  Temp: 98.1 F (36.7 C)  Resp: 18   Filed Weights   01/01/15 1350  Weight: 110 lb 1.6 oz (49.941 kg)    GENERAL:alert, no distress and comfortable SKIN: skin color, texture, turgor are normal, no rashes or significant lesions EYES: normal, Conjunctiva are pink and non-injected, sclera clear OROPHARYNX:no exudate, no erythema and lips, buccal mucosa, and tongue normal  NECK: supple, thyroid normal size, non-tender, without nodularity LYMPH:  no palpable lymphadenopathy in the cervical, axillary or inguinal LUNGS: clear to auscultation and percussion with normal breathing effort HEART: regular rate & rhythm and no murmurs and no lower extremity edema ABDOMEN:abdomen soft, non-tender and normal bowel sounds Musculoskeletal:no cyanosis of digits and no clubbing  NEURO: alert & oriented x 3 with fluent speech, no focal motor/sensory deficits  LABORATORY DATA:  I have reviewed the data as listed   Chemistry      Component Value Date/Time   NA 141 01/01/2015 1311   K 3.8 01/01/2015 1311   CO2 27 01/01/2015 1311   BUN 12.2 01/01/2015 1311   CREATININE 0.9 01/01/2015 1311      Component Value Date/Time   CALCIUM 9.4 01/01/2015 1311   ALKPHOS 80 01/01/2015 1311    AST 10 01/01/2015 1311   ALT 16 01/01/2015 1311   BILITOT 1.03 01/01/2015 1311       Lab Results  Component Value Date   WBC 3.0* 01/01/2015   HGB 12.5 01/01/2015   HCT 36.2 01/01/2015   MCV 86.6 01/01/2015   PLT 122* 01/01/2015   NEUTROABS 0.7* 01/01/2015    ASSESSMENT & PLAN:   Cancer of central portion of right female breast Right breast retroareolar mass 3.7 x 2.8 x 1.7 cm involving anterior two thirds of the breast, T2 N0 M0 stage II a clinical stage, ER 0%, appears 0%, HER-2 positive ratio 3.53  Current Treatment: TCHP today is cycle 2 day 7 Chemo monitoring: 1. Diarrhea: Responded to Imodium 2. Fatigue over the weekend 3. Nausea during chemotherapy improved with Aloxi 4. Bone pain from neulasta: recommended claritin and NSAIDs PRN 5. 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 6. Depression: started on venlafaxine 37.44m daily 6. Abnormal bilirubin:  Bili down to 1.03  Monitoring closely for chemotherapy side effects ECHO 55-60% EF   The patient, her husband, and I spent 25 minutes discussing her anxiety. I performed a complete abdominal exam that was entirely  negative for any abnormality. The patient only has tenderness to her left upper quadrant right around her bottom ribs when she "thinks" about it, but during the exam she was fine. Her worry is that with a PALB2 mutation, she will have pancreatic cancer. I asked her if we were able to treat this hypothetical cancer would she be willing, and her answer was yes. She also understands that in the event of any hypothetical cancer, the earlier it is found, the better. Her hesitation with a CT scan to rule out this fear is that we would find it, not be able to treat it, and she will live out her "numbered days" with it hanging over her head. She is afraid to die. I explained to the patient that I do not believe her twinge of pain is of a  pancreatic nature, it could be muscular and it is not present all the time. If she is not up for a CT scan to prove otherwise then she will have to work on managing her anxiety about it. She must focus on the cancer we do know about and are currently treating. And she is doing remarkably well with her treatments so far. In time, the venlafaxine and other antianxiety meds she is prescribed should help with this. At the end of our visit she was thankful for the extra time spent with her. She stated she already felt better, and will think about her reservations with a scan. She believes if she can get her anxiety under control, the scan will not be necessary.   The patient will return in 2 weeks for her next cycle of chemotherapy and will discuss this further with Dr. Lindi Adie.    No orders of the defined types were placed in this encounter.   The patient has a good understanding of the overall plan. she agrees with it. she will call with any problems that may develop before the next visit here.   Laurie Panda, NP

## 2015-01-02 ENCOUNTER — Encounter (HOSPITAL_COMMUNITY): Payer: Self-pay

## 2015-01-02 ENCOUNTER — Ambulatory Visit: Payer: BLUE CROSS/BLUE SHIELD | Admitting: Nurse Practitioner

## 2015-01-02 ENCOUNTER — Emergency Department (HOSPITAL_COMMUNITY)
Admission: EM | Admit: 2015-01-02 | Discharge: 2015-01-02 | Disposition: A | Discharge status: Home / Self Care | Payer: BLUE CROSS/BLUE SHIELD | Attending: Emergency Medicine | Admitting: Emergency Medicine

## 2015-01-02 ENCOUNTER — Other Ambulatory Visit: Payer: BLUE CROSS/BLUE SHIELD

## 2015-01-02 ENCOUNTER — Encounter: Payer: Self-pay | Admitting: Nurse Practitioner

## 2015-01-02 DIAGNOSIS — R112 Nausea with vomiting, unspecified: Secondary | ICD-10-CM

## 2015-01-02 DIAGNOSIS — Z853 Personal history of malignant neoplasm of breast: Secondary | ICD-10-CM | POA: Insufficient documentation

## 2015-01-02 DIAGNOSIS — Z79899 Other long term (current) drug therapy: Secondary | ICD-10-CM | POA: Diagnosis not present

## 2015-01-02 DIAGNOSIS — F419 Anxiety disorder, unspecified: Secondary | ICD-10-CM | POA: Insufficient documentation

## 2015-01-02 LAB — COMPREHENSIVE METABOLIC PANEL
ALK PHOS: 87 U/L (ref 38–126)
ALT: 19 U/L (ref 14–54)
AST: 16 U/L (ref 15–41)
Albumin: 4.2 g/dL (ref 3.5–5.0)
Anion gap: 12 (ref 5–15)
BILIRUBIN TOTAL: 1.5 mg/dL — AB (ref 0.3–1.2)
BUN: 12 mg/dL (ref 6–20)
CHLORIDE: 101 mmol/L (ref 101–111)
CO2: 23 mmol/L (ref 22–32)
Calcium: 9.3 mg/dL (ref 8.9–10.3)
Creatinine, Ser: 0.81 mg/dL (ref 0.44–1.00)
GFR calc non Af Amer: >60 mL/min (ref >60)
Glucose, Bld: 121 mg/dL — ABNORMAL HIGH (ref 65–99)
Potassium: 3.8 mmol/L (ref 3.5–5.1)
SODIUM: 136 mmol/L (ref 135–145)
TOTAL PROTEIN: 6.8 g/dL (ref 6.5–8.1)

## 2015-01-02 LAB — CBC WITH DIFFERENTIAL/PLATELET
Basophils Absolute: 0 10*3/uL (ref 0.0–0.1)
Basophils Relative: 0 % (ref 0–1)
EOS PCT: 1 % (ref 0–5)
Eosinophils Absolute: 0.1 10*3/uL (ref 0.0–0.7)
HCT: 35.3 % — ABNORMAL LOW (ref 36.0–46.0)
HEMOGLOBIN: 12.2 g/dL (ref 12.0–15.0)
LYMPHS ABS: 1.2 10*3/uL (ref 0.7–4.0)
LYMPHS PCT: 24 % (ref 12–46)
MCH: 29.5 pg (ref 26.0–34.0)
MCHC: 34.6 g/dL (ref 30.0–36.0)
MCV: 85.3 fL (ref 78.0–100.0)
MONOS PCT: 13 % — AB (ref 3–12)
Monocytes Absolute: 0.7 10*3/uL (ref 0.1–1.0)
Neutro Abs: 3.2 10*3/uL (ref 1.7–7.7)
Neutrophils Relative %: 62 % (ref 43–77)
Platelets: 129 10*3/uL — ABNORMAL LOW (ref 150–400)
RBC: 4.14 MIL/uL (ref 3.87–5.11)
RDW: 13.1 % (ref 11.5–15.5)
WBC Morphology: INCREASED
WBC: 5.2 10*3/uL (ref 4.0–10.5)

## 2015-01-02 LAB — LIPASE, BLOOD: Lipase: 17 U/L — ABNORMAL LOW (ref 22–51)

## 2015-01-02 LAB — VITAMIN D 25 HYDROXY (VIT D DEFICIENCY, FRACTURES): VIT D 25 HYDROXY: 32 ng/mL (ref 30–100)

## 2015-01-02 MED ORDER — SODIUM CHLORIDE 0.9 % IV BOLUS (SEPSIS)
1000.0000 mL | Freq: Once | INTRAVENOUS | Status: AC
  Administered 2015-01-02: 1000 mL via INTRAVENOUS

## 2015-01-02 MED ORDER — SODIUM CHLORIDE 0.9 % IV SOLN
1000.0000 mL | Freq: Once | INTRAVENOUS | Status: AC
  Administered 2015-01-02: 1000 mL via INTRAVENOUS

## 2015-01-02 MED ORDER — PROMETHAZINE HCL 25 MG RE SUPP: 25.0000 mg | Freq: Four times a day (QID) | RECTAL | Status: DC | PRN

## 2015-01-02 MED ORDER — PROMETHAZINE HCL 25 MG/ML IJ SOLN
12.5000 mg | Freq: Four times a day (QID) | INTRAMUSCULAR | Status: DC | PRN
  Administered 2015-01-02: 12.5 mg via INTRAVENOUS
  Filled 2015-01-02: qty 1

## 2015-01-02 MED ORDER — PROMETHAZINE HCL 25 MG PO TABS: 25.0000 mg | ORAL_TABLET | Freq: Four times a day (QID) | ORAL | Status: DC | PRN

## 2015-01-02 NOTE — Discharge Instructions (Signed)

## 2015-01-02 NOTE — ED Notes (Signed)
Patient reports she has been vomiting for the past five hours, with no relief from Zofran and Compazine at home.  On call oncologist recommended she come to ER for IV rehydration and Phenergan.

## 2015-01-02 NOTE — ED Provider Notes (Signed)
CSN: [id number]     Arrival date & time 01/02/15  2051 History  This chart was scribed for Robin Lange, PA-C, working with Orlie Dakin, MD by Steva Colder, ED Scribe. The patient was seen in room [facility]TR4/[facility]PT4 at 10:18 PM.    Chief Complaint  Patient presents with  . Emesis      The history is provided by the patient. No language interpreter was used.    HPI Comments: Robin Miles is a 39 y.o. female with a medical hx of right breast CA who presents to the Emergency Department complaining of emesis onset 5 hours. Received second round of chem last week for breast CA. She states that she is having associated symptoms of nausea x 3 days. She states that she has tried zofran and compazine with no relief for her symptoms. She denies fever, pain, cough, SOB, and any other symptoms.   Past Medical History  Diagnosis Date  . Breast cancer, right breast   . Breast cancer 2016    ER-/PR-/Her2+  . Family history of breast cancer   . PALB2-related breast cancer    Past Surgical History  Procedure Laterality Date  . Tonsilectomy/adenoidectomy with myringotomy    . Tubal ligation    . Knee reconstruction Right   . Tonsillectomy    . Portacath placement N/A 11/30/2014    Procedure: INSERTION PORT-A-CATH;  Surgeon: Excell Seltzer, MD;  Location: [facility];  Service: General;  Laterality: N/A;   Family History  Problem Relation Age of Onset  . Breast cancer Maternal Aunt     dx in her late 2s  . Breast cancer Paternal Grandmother     dx in her 77s  . Breast cancer Cousin     dx late 38s; BRCA-  . Cancer Cousin     dx in her 53s; leiomyosarcoma   History  Substance Use Topics  . Smoking status: Never Smoker   . Smokeless tobacco: Never Used  . Alcohol Use: No   OB History    No data available     Review of Systems  Constitutional: Negative for fever.  Respiratory: Negative for cough and shortness of breath.   Gastrointestinal: Positive for nausea and  vomiting.  Musculoskeletal: Negative for myalgias.      Allergies  Review of patient's allergies indicates no known allergies.  Home Medications   Prior to Admission medications   Medication Sig Start Date End Date Taking? Authorizing Provider  ALPRAZolam Duanne Moron) 0.5 MG tablet Take 0.5 mg by mouth daily as needed for anxiety.  11/21/14  Yes Historical Provider, MD  Alum & Mag Hydroxide-Simeth (MAGIC MOUTHWASH W/LIDOCAINE) SOLN 5-11m  Four times daily as needed for mouth discomfort 12/13/14  Yes VNicholas Lose MD  dexamethasone (DECADRON) 4 MG tablet Take 1 tablet (4 mg total) by mouth 2 (two) times daily. Start the day before Taxotere. Then again the day after chemo for 3 days. 11/29/14  Yes VNicholas Lose MD  HYDROcodone-acetaminophen (NORCO/VICODIN) 5-325 MG per tablet Take 1-2 tablets by mouth every 4 (four) hours as needed for moderate pain or severe pain. 11/30/14  Yes BExcell Seltzer MD  lidocaine-prilocaine (EMLA) cream Apply to affected area once 11/29/14  Yes VNicholas Lose MD  loratadine (CLARITIN) 10 MG tablet Take 10 mg by mouth daily.   Yes Historical Provider, MD  LORazepam (ATIVAN) 0.5 MG tablet Take 1 tablet (0.5 mg total) by mouth at bedtime. 11/29/14  Yes VNicholas Lose MD  Omeprazole Magnesium (PRILOSEC OTC PO)  Take 1 tablet by mouth daily.   Yes Historical Provider, MD  ondansetron (ZOFRAN) 8 MG tablet Take 1 tablet (8 mg total) by mouth 2 (two) times daily. Start the day after chemo for 3 days. Then take as needed for nausea or vomiting. 11/29/14  Yes Nicholas Lose, MD  pegfilgrastim (NEULASTA) 6 MG/0.6ML injection Inject 6 mg into the skin once.   Yes Historical Provider, MD  PRESCRIPTION MEDICATION Chemo--[facility].   Yes Historical Provider, MD  Probiotic Product (PROBIOTIC PO) Take 1 capsule by mouth daily.   Yes Historical Provider, MD  prochlorperazine (COMPAZINE) 10 MG tablet Take 1 tablet (10 mg total) by mouth every 6 (six) hours as needed (Nausea or vomiting). 11/29/14  Yes  Nicholas Lose, MD  UNABLE TO FIND 1 each by Other route once. Dispense cranial prosthesis per medical necessity for alopecia due to chemotherapy secondary to known breast cancer diagnosis. 11/29/14  Yes Nicholas Lose, MD  venlafaxine XR (EFFEXOR-XR) 37.5 MG 24 hr capsule Take 1 capsule (37.5 mg total) by mouth daily with breakfast. 12/31/14  Yes Heather F Boelter, NP   BP 106/68 mmHg  Pulse 99  Temp(Src) 98.1 F (36.7 C) (Oral)  Resp 18  SpO2 99%  LMP 12/02/2014 (Approximate) Physical Exam  Constitutional: She is oriented to person, place, and time. She appears well-developed and well-nourished.  Non-toxic appearance. No distress.  Comfortable appearing.  HENT:  Head: Normocephalic and atraumatic.  Eyes: EOM are normal.  Neck: Neck supple. No tracheal deviation present.  Cardiovascular: Normal rate.   Pulmonary/Chest: Effort normal. No respiratory distress.  Abdominal: Soft. There is no tenderness.  Musculoskeletal: Normal range of motion.  Neurological: She is alert and oriented to person, place, and time.  Skin: Skin is warm and dry.  Psychiatric: She has a normal mood and affect. Her behavior is normal.  Nursing note and vitals reviewed.   ED Course  Procedures (including critical care time) DIAGNOSTIC STUDIES: Oxygen Saturation is 99% on RA, nl by my interpretation.    COORDINATION OF CARE: 10:22 PM-Discussed treatment plan which includes IV fluids and labs with pt at bedside and pt agreed to plan.   Labs Review Labs Reviewed  CBC WITH DIFFERENTIAL/PLATELET - Abnormal; Notable for the following:    HCT 35.3 (*)    Platelets 129 (*)    All other components within normal limits  COMPREHENSIVE METABOLIC PANEL  LIPASE, BLOOD   Results for orders placed or performed during the hospital encounter of 01/02/15  CBC with Differential  Result Value Ref Range   WBC 5.2 4.0 - 10.5 K/uL   RBC 4.14 3.87 - 5.11 MIL/uL   Hemoglobin 12.2 12.0 - 15.0 g/dL   HCT 35.3 (L) 36.0 - 46.0 %    MCV 85.3 78.0 - 100.0 fL   MCH 29.5 26.0 - 34.0 pg   MCHC 34.6 30.0 - 36.0 g/dL   RDW 13.1 11.5 - 15.5 %   Platelets 129 (L) 150 - 400 K/uL   Neutrophils Relative % 62 43 - 77 %   Lymphocytes Relative 24 12 - 46 %   Monocytes Relative 13 (H) 3 - 12 %   Eosinophils Relative 1 0 - 5 %   Basophils Relative 0 0 - 1 %   Neutro Abs 3.2 1.7 - 7.7 K/uL   Lymphs Abs 1.2 0.7 - 4.0 K/uL   Monocytes Absolute 0.7 0.1 - 1.0 K/uL   Eosinophils Absolute 0.1 0.0 - 0.7 K/uL   Basophils Absolute 0.0 0.0 -  0.1 K/uL   WBC Morphology INCREASED BANDS (>20% BANDS)   Comprehensive metabolic panel  Result Value Ref Range   Sodium 136 135 - 145 mmol/L   Potassium 3.8 3.5 - 5.1 mmol/L   Chloride 101 101 - 111 mmol/L   CO2 23 22 - 32 mmol/L   Glucose, Bld 121 (H) 65 - 99 mg/dL   BUN 12 6 - 20 mg/dL   Creatinine, Ser 0.81 0.44 - 1.00 mg/dL   Calcium 9.3 8.9 - 10.3 mg/dL   Total Protein 6.8 6.5 - 8.1 g/dL   Albumin 4.2 3.5 - 5.0 g/dL   AST 16 15 - 41 U/L   ALT 19 14 - 54 U/L   Alkaline Phosphatase 87 38 - 126 U/L   Total Bilirubin 1.5 (H) 0.3 - 1.2 mg/dL   GFR calc non Af Amer >60 >60 mL/min   GFR calc Af Amer >60 >60 mL/min   Anion gap 12 5 - 15  Lipase, blood  Result Value Ref Range   Lipase 17 (L) 22 - 51 U/L    Imaging Review No results found.   EKG Interpretation None      MDM   Final diagnoses:  None    1. Emesis  No further vomiting in ED. IV fluids provided with Phenergan. She is feeling better. Lab studies improved or at baseline. She is tolerating PO fluids without difficulty. Stable for discharge home.  I personally performed the services described in this documentation, which was scribed in my presence. The recorded information has been reviewed and is accurate.     Robin Lange, PA-C 01/03/15 [id number]  Everlene Balls, MD 01/03/15 [phone]

## 2015-01-03 ENCOUNTER — Telehealth: Payer: Self-pay

## 2015-01-03 NOTE — Telephone Encounter (Signed)
Pt report nausea started Tuesday night - she took an ativan and went to bad.  Wednesday the n/v got bad - every time she stood up she got nauseous.  She ended up going to the ED last night and received IV phenergan.  Pt reports under control.  Advised patient she can take her nausea meds on a scheduled basis during the time period she knows the chemo has made her sick.  Advised patient if she has trouble controlling the nausea she can stagger her nausea meds so she keeps something in her system at all times.  Let pt know she should call us if she needs fluids.  Pt voiced understanding.   [facility] Call report dtd 01/02/15 549 pm rcvd by fax.  Sent to scan.

## 2015-01-10 ENCOUNTER — Other Ambulatory Visit: Payer: Self-pay | Admitting: Nurse Practitioner

## 2015-01-14 ENCOUNTER — Telehealth: Payer: Self-pay

## 2015-01-14 NOTE — Telephone Encounter (Signed)
[place] results dtd 04/10/15 rcvd from [facility].  Reviewed by Dr. Lindi Adie, Sent to scan.

## 2015-01-15 NOTE — Assessment & Plan Note (Signed)
Right breast retroareolar mass 3.7 x 2.8 x 1.7 cm involving anterior two thirds of the breast, T2 N0 M0 stage II a clinical stage, ER 0%, appears 0%, HER-2 positive ratio 3.53  Current Treatment: TCHP today is cycle 3 day 1 Chemo monitoring: 1. Diarrhea: Responded to Imodium 2. Fatigue over the weekend 3. Nausea during chemotherapy improved with Aloxi 4. Bone pain from neulasta: recommended claritin and NSAIDs PRN 5. 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 6. Depression: started on venlafaxine 37.59m daily 6. Abnormal bilirubin: Bili down to 1.03  Monitoring closely for chemotherapy side effects ECHO 55-60% EF

## 2015-01-16 ENCOUNTER — Encounter: Payer: Self-pay | Admitting: Hematology and Oncology

## 2015-01-16 ENCOUNTER — Other Ambulatory Visit (HOSPITAL_BASED_OUTPATIENT_CLINIC_OR_DEPARTMENT_OTHER): Payer: BLUE CROSS/BLUE SHIELD

## 2015-01-16 ENCOUNTER — Ambulatory Visit (HOSPITAL_BASED_OUTPATIENT_CLINIC_OR_DEPARTMENT_OTHER): Payer: BLUE CROSS/BLUE SHIELD

## 2015-01-16 ENCOUNTER — Telehealth: Payer: Self-pay | Admitting: Hematology and Oncology

## 2015-01-16 ENCOUNTER — Ambulatory Visit (HOSPITAL_BASED_OUTPATIENT_CLINIC_OR_DEPARTMENT_OTHER): Payer: BLUE CROSS/BLUE SHIELD | Admitting: Hematology and Oncology

## 2015-01-16 VITALS — BP 102/67 | HR 77 | Temp 97.8°F | Resp 18 | Ht 62.5 in | Wt 113.1 lb

## 2015-01-16 DIAGNOSIS — Z5189 Encounter for other specified aftercare: Secondary | ICD-10-CM | POA: Diagnosis not present

## 2015-01-16 DIAGNOSIS — Z5112 Encounter for antineoplastic immunotherapy: Secondary | ICD-10-CM | POA: Diagnosis not present

## 2015-01-16 DIAGNOSIS — Z5111 Encounter for antineoplastic chemotherapy: Secondary | ICD-10-CM | POA: Diagnosis not present

## 2015-01-16 DIAGNOSIS — C50111 Malignant neoplasm of central portion of right female breast: Secondary | ICD-10-CM

## 2015-01-16 DIAGNOSIS — R110 Nausea: Secondary | ICD-10-CM

## 2015-01-16 LAB — COMPREHENSIVE METABOLIC PANEL (CC13)
ALK PHOS: 73 U/L (ref 40–150)
ALT: 11 U/L (ref 0–55)
AST: 11 U/L (ref 5–34)
Albumin: 3.9 g/dL (ref 3.5–5.0)
Anion Gap: 10 mEq/L (ref 3–11)
BILIRUBIN TOTAL: 0.6 mg/dL (ref 0.20–1.20)
BUN: 16.1 mg/dL (ref 7.0–26.0)
CO2: 26 mEq/L (ref 22–29)
CREATININE: 0.8 mg/dL (ref 0.6–1.1)
Calcium: 9.7 mg/dL (ref 8.4–10.4)
Chloride: 106 mEq/L (ref 98–109)
EGFR: 86 mL/min/{1.73_m2} — AB (ref >90)
GLUCOSE: 121 mg/dL (ref 70–140)
Potassium: 4.1 mEq/L (ref 3.5–5.1)
Sodium: 141 mEq/L (ref 136–145)
Total Protein: 6.5 g/dL (ref 6.4–8.3)

## 2015-01-16 LAB — CBC WITH DIFFERENTIAL/PLATELET
BASO%: 0.2 % (ref 0.0–2.0)
Basophils Absolute: 0 10*3/uL (ref 0.0–0.1)
EOS%: 0 % (ref 0.0–7.0)
Eosinophils Absolute: 0 10*3/uL (ref 0.0–0.5)
HCT: 30.9 % — ABNORMAL LOW (ref 34.8–46.6)
HGB: 10.6 g/dL — ABNORMAL LOW (ref 11.6–15.9)
LYMPH#: 1.3 10*3/uL (ref 0.9–3.3)
LYMPH%: 18.1 % (ref 14.0–49.7)
MCH: 30.1 pg (ref 25.1–34.0)
MCHC: 34.5 g/dL (ref 31.5–36.0)
MCV: 87.4 fL (ref 79.5–101.0)
MONO#: 0.3 10*3/uL (ref 0.1–0.9)
MONO%: 4.2 % (ref 0.0–14.0)
NEUT%: 77.5 % — AB (ref 38.4–76.8)
NEUTROS ABS: 5.5 10*3/uL (ref 1.5–6.5)
Platelets: 388 10*3/uL (ref 145–400)
RBC: 3.53 10*6/uL — AB (ref 3.70–5.45)
RDW: 16.1 % — ABNORMAL HIGH (ref 11.2–14.5)
WBC: 7.1 10*3/uL (ref 3.9–10.3)

## 2015-01-16 MED ORDER — ACETAMINOPHEN 325 MG PO TABS
650.0000 mg | ORAL_TABLET | Freq: Once | ORAL | Status: AC
  Administered 2015-01-16: 650 mg via ORAL

## 2015-01-16 MED ORDER — PEGFILGRASTIM 6 MG/0.6ML ~~LOC~~ PSKT
6.0000 mg | PREFILLED_SYRINGE | Freq: Once | SUBCUTANEOUS | Status: AC
  Administered 2015-01-16: 6 mg via SUBCUTANEOUS
  Filled 2015-01-16: qty 0.6

## 2015-01-16 MED ORDER — DIPHENHYDRAMINE HCL 25 MG PO CAPS
50.0000 mg | ORAL_CAPSULE | Freq: Once | ORAL | Status: AC
  Administered 2015-01-16: 50 mg via ORAL

## 2015-01-16 MED ORDER — DEXAMETHASONE 4 MG PO TABS: 4.0000 mg | ORAL_TABLET | Freq: Two times a day (BID) | ORAL | Status: DC

## 2015-01-16 MED ORDER — SODIUM CHLORIDE 0.9 % IV SOLN: Freq: Once | INTRAVENOUS | Status: DC

## 2015-01-16 MED ORDER — PALONOSETRON HCL INJECTION 0.25 MG/5ML
INTRAVENOUS | Status: AC
  Filled 2015-01-16: qty 5

## 2015-01-16 MED ORDER — SODIUM CHLORIDE 0.9 % IV SOLN
Freq: Once | INTRAVENOUS | Status: AC
  Administered 2015-01-16: 09:00:00 via INTRAVENOUS

## 2015-01-16 MED ORDER — DIPHENHYDRAMINE HCL 25 MG PO CAPS
ORAL_CAPSULE | ORAL | Status: AC
  Filled 2015-01-16: qty 2

## 2015-01-16 MED ORDER — SODIUM CHLORIDE 0.9 % IV SOLN
600.0000 mg | Freq: Once | INTRAVENOUS | Status: AC
  Administered 2015-01-16: 600 mg via INTRAVENOUS
  Filled 2015-01-16: qty 60

## 2015-01-16 MED ORDER — ACETAMINOPHEN 325 MG PO TABS
ORAL_TABLET | ORAL | Status: AC
  Filled 2015-01-16: qty 2

## 2015-01-16 MED ORDER — DOCETAXEL CHEMO INJECTION 160 MG/16ML
75.0000 mg/m2 | Freq: Once | INTRAVENOUS | Status: AC
  Administered 2015-01-16: 110 mg via INTRAVENOUS
  Filled 2015-01-16: qty 11

## 2015-01-16 MED ORDER — HEPARIN SOD (PORK) LOCK FLUSH 100 UNIT/ML IV SOLN
500.0000 [IU] | Freq: Once | INTRAVENOUS | Status: AC | PRN
  Administered 2015-01-16: 500 [IU]
  Filled 2015-01-16: qty 5

## 2015-01-16 MED ORDER — PALONOSETRON HCL INJECTION 0.25 MG/5ML
0.2500 mg | Freq: Once | INTRAVENOUS | Status: AC
  Administered 2015-01-16: 0.25 mg via INTRAVENOUS

## 2015-01-16 MED ORDER — SODIUM CHLORIDE 0.9 % IJ SOLN
10.0000 mL | INTRAMUSCULAR | Status: DC | PRN
  Administered 2015-01-16: 10 mL
  Filled 2015-01-16: qty 10

## 2015-01-16 MED ORDER — SODIUM CHLORIDE 0.9 % IV SOLN
6.0000 mg/kg | Freq: Once | INTRAVENOUS | Status: AC
  Administered 2015-01-16: 315 mg via INTRAVENOUS
  Filled 2015-01-16: qty 15

## 2015-01-16 MED ORDER — LORAZEPAM 0.5 MG PO TABS: 0.5000 mg | ORAL_TABLET | Freq: Every day | ORAL | Status: DC

## 2015-01-16 MED ORDER — SODIUM CHLORIDE 0.9 % IV SOLN
Freq: Once | INTRAVENOUS | Status: AC
  Administered 2015-01-16: 11:00:00 via INTRAVENOUS
  Filled 2015-01-16: qty 5

## 2015-01-16 MED ORDER — PERTUZUMAB CHEMO INJECTION 420 MG/14ML
420.0000 mg | Freq: Once | INTRAVENOUS | Status: DC
  Filled 2015-01-16: qty 14

## 2015-01-16 MED ORDER — DIPHENOXYLATE-ATROPINE 2.5-0.025 MG PO TABS: 1.0000 | ORAL_TABLET | Freq: Four times a day (QID) | ORAL | Status: DC | PRN

## 2015-01-16 NOTE — Addendum Note (Signed)
Addended by: Prentiss Bells on: 01/16/2015 01:25 PM   Modules accepted: Orders

## 2015-01-16 NOTE — Patient Instructions (Signed)
Cancer Center Discharge Instructions for Patients Receiving Chemotherapy  Today you received the following chemotherapy agents :  Herceptin, Perjeta, Taxotere, Carboplatin.  To help prevent nausea and vomiting after your treatment, we encourage you to take your nausea medication as prescribed.   If you develop nausea and vomiting that is not controlled by your nausea medication, call the clinic.   BELOW ARE SYMPTOMS THAT SHOULD BE REPORTED IMMEDIATELY:  *FEVER GREATER THAN 100.5 F  *CHILLS WITH OR WITHOUT FEVER  NAUSEA AND VOMITING THAT IS NOT CONTROLLED WITH YOUR NAUSEA MEDICATION  *UNUSUAL SHORTNESS OF BREATH  *UNUSUAL BRUISING OR BLEEDING  TENDERNESS IN MOUTH AND THROAT WITH OR WITHOUT PRESENCE OF ULCERS  *URINARY PROBLEMS  *BOWEL PROBLEMS  UNUSUAL RASH Items with * indicate a potential emergency and should be followed up as soon as possible.  Feel free to call the clinic you have any questions or concerns. The clinic phone number is [phone].  Please show the CHEMO ALERT CARD at check-in to the Emergency Department and triage nurse.   

## 2015-01-16 NOTE — Progress Notes (Signed)
Patient Care Team: Olga Millers, MD as PCP - General (Obstetrics and Gynecology)  DIAGNOSIS: No matching staging information was found for the patient.  SUMMARY OF ONCOLOGIC HISTORY:   Cancer of central portion of right female breast   11/14/2014 Initial Diagnosis Right breast biopsy 8:30 position: Invasive ductal carcinoma with DCIS, grade 2, ER 0%, PR 0%, HER-2 positive ratio 3.53   11/20/2014 Breast MRI Right breast: 3.7 x 2.8 x 1.7 cm area of non-mass enhancement and retroareolar right breast into the lower outer quadrant and lower inner quadrant extending into the nipple involving anterior two thirds of the breast, no lymph nodes   12/04/2014 -  Neo-Adjuvant Chemotherapy Taxotere, carboplatin, Herceptin, Perjeta 6 cycles followed by Herceptin maintenance for 1 year   12/19/2014 Procedure PALB2 Mutation    CHIEF COMPLIANT: Nausea vomiting diarrhea  INTERVAL HISTORY: Robin Miles is a 39 year old with above-mentioned history of right breast cancer currently neo-chemotherapy. She received 2 cycles of chemotherapy. After the last cycle she felt severely nauseated on day 7 and started throwing up profusely. She also had diarrhea several times and Imodium was taken with the somewhat borderline benefit. She felt weak and tired but she was able to get 2 days of work earlier this week. Patient was prescribed Effexor but she could not tolerate it and has discontinued it.  REVIEW OF SYSTEMS:   Constitutional: Denies fevers, chills or abnormal weight loss Eyes: Denies blurriness of vision Ears, nose, mouth, throat, and face: Denies mucositis or sore throat Respiratory: Denies cough, dyspnea or wheezes Cardiovascular: Denies palpitation, chest discomfort or lower extremity swelling Gastrointestinal: Nausea vomiting and diarrhea Skin: Denies abnormal skin rashes Lymphatics: Denies new lymphadenopathy or easy bruising Neurological:Denies numbness, tingling or new weaknesses Behavioral/Psych: Mood is  stable, no new changes   All other systems were reviewed with the patient and are negative.  I have reviewed the past medical history, past surgical history, social history and family history with the patient and they are unchanged from previous note.  ALLERGIES:  has No Known Allergies.  MEDICATIONS:  Current Outpatient Prescriptions  Medication Sig Dispense Refill  . ALPRAZolam (XANAX) 0.5 MG tablet Take 0.5 mg by mouth daily as needed for anxiety.     . Alum & Mag Hydroxide-Simeth (MAGIC MOUTHWASH W/LIDOCAINE) SOLN 5-15m  Four times daily as needed for mouth discomfort 240 mL 1  . dexamethasone (DECADRON) 4 MG tablet Take 1 tablet (4 mg total) by mouth 2 (two) times daily. Start the day before Taxotere. Then again the day after chemo for 3 days. 30 tablet 1  . HYDROcodone-acetaminophen (NORCO/VICODIN) 5-325 MG per tablet Take 1-2 tablets by mouth every 4 (four) hours as needed for moderate pain or severe pain. 25 tablet 0  . lidocaine-prilocaine (EMLA) cream Apply to affected area once 30 g 3  . loratadine (CLARITIN) 10 MG tablet Take 10 mg by mouth daily.    .Marland KitchenLORazepam (ATIVAN) 0.5 MG tablet Take 1 tablet (0.5 mg total) by mouth at bedtime. 30 tablet 0  . Omeprazole Magnesium (PRILOSEC OTC PO) Take 1 tablet by mouth daily.    . ondansetron (ZOFRAN) 8 MG tablet Take 1 tablet (8 mg total) by mouth 2 (two) times daily. Start the day after chemo for 3 days. Then take as needed for nausea or vomiting. 30 tablet 1  . pegfilgrastim (NEULASTA) 6 MG/0.6ML injection Inject 6 mg into the skin once.    .Marland KitchenPRESCRIPTION MEDICATION Chemo--[facility].    . Probiotic Product (PROBIOTIC PO)  Take 1 capsule by mouth daily.    . prochlorperazine (COMPAZINE) 10 MG tablet Take 1 tablet (10 mg total) by mouth every 6 (six) hours as needed (Nausea or vomiting). 30 tablet 1  . promethazine (PHENERGAN) 25 MG suppository Place 1 suppository (25 mg total) rectally every 6 (six) hours as needed for nausea or vomiting. 12  each 0  . promethazine (PHENERGAN) 25 MG tablet Take 1 tablet (25 mg total) by mouth every 6 (six) hours as needed for nausea or vomiting. 30 tablet 0  . UNABLE TO FIND 1 each by Other route once. Dispense cranial prosthesis per medical necessity for alopecia due to chemotherapy secondary to known breast cancer diagnosis. 1 each 0  . venlafaxine XR (EFFEXOR-XR) 37.5 MG 24 hr capsule Take 1 capsule (37.5 mg total) by mouth daily with breakfast. 30 capsule 1   No current facility-administered medications for this visit.    PHYSICAL EXAMINATION: ECOG PERFORMANCE STATUS: 1 - Symptomatic but completely ambulatory  Filed Vitals:   01/16/15 0812  BP: 102/67  Pulse: 77  Temp: 97.8 F (36.6 C)  Resp: 18   Filed Weights   01/16/15 0812  Weight: 113 lb 1.6 oz (51.302 kg)    GENERAL:alert, no distress and comfortable SKIN: skin color, texture, turgor are normal, no rashes or significant lesions EYES: normal, Conjunctiva are pink and non-injected, sclera clear OROPHARYNX:no exudate, no erythema and lips, buccal mucosa, and tongue normal  NECK: supple, thyroid normal size, non-tender, without nodularity LYMPH:  no palpable lymphadenopathy in the cervical, axillary or inguinal LUNGS: clear to auscultation and percussion with normal breathing effort HEART: regular rate & rhythm and no murmurs and no lower extremity edema ABDOMEN:abdomen soft, non-tender and normal bowel sounds Musculoskeletal:no cyanosis of digits and no clubbing  NEURO: alert & oriented x 3 with fluent speech, no focal motor/sensory deficits   LABORATORY DATA:  I have reviewed the data as listed   Chemistry      Component Value Date/Time   NA 136 01/02/2015 2217   NA 141 01/01/2015 1311   K 3.8 01/02/2015 2217   K 3.8 01/01/2015 1311   CL 101 01/02/2015 2217   CO2 23 01/02/2015 2217   CO2 27 01/01/2015 1311   BUN 12 01/02/2015 2217   BUN 12.2 01/01/2015 1311   CREATININE 0.81 01/02/2015 2217   CREATININE 0.9  01/01/2015 1311      Component Value Date/Time   CALCIUM 9.3 01/02/2015 2217   CALCIUM 9.4 01/01/2015 1311   ALKPHOS 87 01/02/2015 2217   ALKPHOS 80 01/01/2015 1311   AST 16 01/02/2015 2217   AST 10 01/01/2015 1311   ALT 19 01/02/2015 2217   ALT 16 01/01/2015 1311   BILITOT 1.5* 01/02/2015 2217   BILITOT 1.03 01/01/2015 1311       Lab Results  Component Value Date   WBC 7.1 01/16/2015   HGB 10.6* 01/16/2015   HCT 30.9* 01/16/2015   MCV 87.4 01/16/2015   PLT 388 01/16/2015   NEUTROABS 5.5 01/16/2015   ASSESSMENT & PLAN:  Cancer of central portion of right female breast Right breast retroareolar mass 3.7 x 2.8 x 1.7 cm involving anterior two thirds of the breast, T2 N0 M0 stage II a clinical stage, ER 0%, appears 0%, HER-2 positive ratio 3.53  Current Treatment: [facility] today is cycle 3 day 1 Chemo monitoring: 1. Diarrhea: I added Lomotil to Imodium 2. Fatigue over the weekend 3. Nausea during chemotherapy improved with Aloxi but patient had delayed  nausea and vomiting. We added Emend to her regimen 4. Bone pain from neulasta: recommended claritin and NSAIDs PRN 5. 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 6. Depression: started on venlafaxine 37.31m daily 6. Abnormal bilirubin: Bilirubin has been up and down  Monitoring closely for chemotherapy side effects ECHO 55-60% EF Return to clinic in 1 week for fluids and Aloxi And in 2 weeks for follow-up with me in the clinic  No orders of the defined types were placed in this encounter.   The patient has a good understanding of the overall plan. she agrees with it. she will call with any problems that may develop before the next visit here.   GRulon Eisenmenger MD

## 2015-01-16 NOTE — Telephone Encounter (Signed)
Appointments made and patient will get an avs in chemo as advised

## 2015-01-22 ENCOUNTER — Ambulatory Visit (HOSPITAL_BASED_OUTPATIENT_CLINIC_OR_DEPARTMENT_OTHER): Payer: BLUE CROSS/BLUE SHIELD

## 2015-01-22 VITALS — BP 90/50 | HR 79 | Temp 98.4°F | Resp 18

## 2015-01-22 DIAGNOSIS — R110 Nausea: Secondary | ICD-10-CM

## 2015-01-22 DIAGNOSIS — C50111 Malignant neoplasm of central portion of right female breast: Secondary | ICD-10-CM | POA: Diagnosis not present

## 2015-01-22 MED ORDER — SODIUM CHLORIDE 0.9 % IV SOLN
INTRAVENOUS | Status: AC
  Administered 2015-01-22: 15:00:00 via INTRAVENOUS

## 2015-01-22 MED ORDER — SODIUM CHLORIDE 0.9 % IJ SOLN
10.0000 mL | INTRAMUSCULAR | Status: AC | PRN
  Administered 2015-01-22: 10 mL
  Filled 2015-01-22: qty 10

## 2015-01-22 MED ORDER — HEPARIN SOD (PORK) LOCK FLUSH 100 UNIT/ML IV SOLN
500.0000 [IU] | INTRAVENOUS | Status: AC | PRN
  Administered 2015-01-22: 500 [IU]
  Filled 2015-01-22: qty 5

## 2015-01-22 MED ORDER — PALONOSETRON HCL INJECTION 0.25 MG/5ML
0.2500 mg | Freq: Once | INTRAVENOUS | Status: AC
  Administered 2015-01-22: 0.25 mg via INTRAVENOUS

## 2015-01-22 MED ORDER — PALONOSETRON HCL INJECTION 0.25 MG/5ML
INTRAVENOUS | Status: AC
  Filled 2015-01-22: qty 5

## 2015-01-22 NOTE — Patient Instructions (Signed)
Dehydration, Adult Dehydration is when you lose more fluids from the body than you take in. Vital organs like the kidneys, brain, and heart cannot function without a proper amount of fluids and salt. Any loss of fluids from the body can cause dehydration.  CAUSES   Vomiting.  Diarrhea.  Excessive sweating.  Excessive urine output.  Fever. SYMPTOMS  Mild dehydration  Thirst.  Dry lips.  Slightly dry mouth. Moderate dehydration  Very dry mouth.  Sunken eyes.  Skin does not bounce back quickly when lightly pinched and released.  Dark urine and decreased urine production.  Decreased tear production.  Headache. Severe dehydration  Very dry mouth.  Extreme thirst.  Rapid, weak pulse (more than 100 beats per minute at rest).  Cold hands and feet.  Not able to sweat in spite of heat and temperature.  Rapid breathing.  Blue lips.  Confusion and lethargy.  Difficulty being awakened.  Minimal urine production.  No tears. DIAGNOSIS  Your caregiver will diagnose dehydration based on your symptoms and your exam. Blood and urine tests will help confirm the diagnosis. The diagnostic evaluation should also identify the cause of dehydration. TREATMENT  Treatment of mild or moderate dehydration can often be done at home by increasing the amount of fluids that you drink. It is best to drink small amounts of fluid more often. Drinking too much at one time can make vomiting worse. Refer to the home care instructions below. Severe dehydration needs to be treated at the hospital where you will probably be given intravenous (IV) fluids that contain water and electrolytes. HOME CARE INSTRUCTIONS   Ask your caregiver about specific rehydration instructions.  Drink enough fluids to keep your urine clear or pale yellow.  Drink small amounts frequently if you have nausea and vomiting.  Eat as you normally do.  Avoid:  Foods or drinks high in sugar.  Carbonated  drinks.  Juice.  Extremely hot or cold fluids.  Drinks with caffeine.  Fatty, greasy foods.  Alcohol.  Tobacco.  Overeating.  Gelatin desserts.  Wash your hands well to avoid spreading bacteria and viruses.  Only take over-the-counter or prescription medicines for pain, discomfort, or fever as directed by your caregiver.  Ask your caregiver if you should continue all prescribed and over-the-counter medicines.  Keep all follow-up appointments with your caregiver. SEEK MEDICAL CARE IF:  You have abdominal pain and it increases or stays in one area (localizes).  You have a rash, stiff neck, or severe headache.  You are irritable, sleepy, or difficult to awaken.  You are weak, dizzy, or extremely thirsty. SEEK IMMEDIATE MEDICAL CARE IF:   You are unable to keep fluids down or you get worse despite treatment.  You have frequent episodes of vomiting or diarrhea.  You have blood or green matter (bile) in your vomit.  You have blood in your stool or your stool looks black and tarry.  You have not urinated in 6 to 8 hours, or you have only urinated a small amount of very dark urine.  You have a fever.  You faint. MAKE SURE YOU:   Understand these instructions.  Will watch your condition.  Will get help right away if you are not doing well or get worse. Document Released: 07/06/2005 Document Revised: 09/28/2011 Document Reviewed: 02/23/2011 Exit[employer] Patient Information 2015 [employer]. This information is not intended to replace advice given to you by your health care provider. Make sure you discuss any questions you have with your health care   provider.  

## 2015-01-23 ENCOUNTER — Ambulatory Visit (HOSPITAL_BASED_OUTPATIENT_CLINIC_OR_DEPARTMENT_OTHER): Payer: BLUE CROSS/BLUE SHIELD

## 2015-01-23 ENCOUNTER — Other Ambulatory Visit: Payer: Self-pay | Admitting: *Deleted

## 2015-01-23 ENCOUNTER — Ambulatory Visit: Payer: BLUE CROSS/BLUE SHIELD

## 2015-01-23 ENCOUNTER — Telehealth: Payer: Self-pay

## 2015-01-23 ENCOUNTER — Ambulatory Visit (HOSPITAL_BASED_OUTPATIENT_CLINIC_OR_DEPARTMENT_OTHER): Payer: BLUE CROSS/BLUE SHIELD | Admitting: Nurse Practitioner

## 2015-01-23 VITALS — BP 115/60 | HR 105 | Temp 100.6°F | Resp 18 | Wt 112.0 lb

## 2015-01-23 DIAGNOSIS — Z95828 Presence of other vascular implants and grafts: Secondary | ICD-10-CM

## 2015-01-23 DIAGNOSIS — C50111 Malignant neoplasm of central portion of right female breast: Secondary | ICD-10-CM

## 2015-01-23 DIAGNOSIS — J019 Acute sinusitis, unspecified: Secondary | ICD-10-CM | POA: Diagnosis not present

## 2015-01-23 LAB — CBC WITH DIFFERENTIAL/PLATELET
BASO%: 0.2 % (ref 0.0–2.0)
BASOS ABS: 0 10*3/uL (ref 0.0–0.1)
EOS ABS: 0 10*3/uL (ref 0.0–0.5)
EOS%: 0.1 % (ref 0.0–7.0)
HCT: 31.3 % — ABNORMAL LOW (ref 34.8–46.6)
HEMOGLOBIN: 10.7 g/dL — AB (ref 11.6–15.9)
LYMPH%: 23.4 % (ref 14.0–49.7)
MCH: 30.1 pg (ref 25.1–34.0)
MCHC: 34.2 g/dL (ref 31.5–36.0)
MCV: 88.2 fL (ref 79.5–101.0)
MONO#: 0.9 10*3/uL (ref 0.1–0.9)
MONO%: 7.9 % (ref 0.0–14.0)
NEUT%: 68.4 % (ref 38.4–76.8)
NEUTROS ABS: 7.9 10*3/uL — AB (ref 1.5–6.5)
Platelets: 305 10*3/uL (ref 145–400)
RBC: 3.55 10*6/uL — ABNORMAL LOW (ref 3.70–5.45)
RDW: 17 % — ABNORMAL HIGH (ref 11.2–14.5)
WBC: 11.6 10*3/uL — AB (ref 3.9–10.3)
lymph#: 2.7 10*3/uL (ref 0.9–3.3)

## 2015-01-23 LAB — URINALYSIS, MICROSCOPIC - CHCC
Bilirubin (Urine): NEGATIVE
Blood: NEGATIVE
Glucose: NEGATIVE mg/dL
KETONES: NEGATIVE mg/dL
Leukocyte Esterase: NEGATIVE
Nitrite: NEGATIVE
PH: 8 (ref 4.6–8.0)
Protein: NEGATIVE mg/dL
SPECIFIC GRAVITY, URINE: 1.005 (ref 1.003–1.035)
Urobilinogen, UR: 0.2 mg/dL (ref 0.2–1)

## 2015-01-23 LAB — COMPREHENSIVE METABOLIC PANEL (CC13)
ALBUMIN: 3.7 g/dL (ref 3.5–5.0)
ALK PHOS: 87 U/L (ref 40–150)
ALT: 26 U/L (ref 0–55)
ANION GAP: 11 meq/L (ref 3–11)
AST: 22 U/L (ref 5–34)
BILIRUBIN TOTAL: 0.46 mg/dL (ref 0.20–1.20)
BUN: 10.8 mg/dL (ref 7.0–26.0)
CALCIUM: 9.7 mg/dL (ref 8.4–10.4)
CO2: 26 mEq/L (ref 22–29)
Chloride: 103 mEq/L (ref 98–109)
Creatinine: 0.9 mg/dL (ref 0.6–1.1)
EGFR: 84 mL/min/{1.73_m2} — AB (ref >90)
GLUCOSE: 115 mg/dL (ref 70–140)
Potassium: 3.9 mEq/L (ref 3.5–5.1)
Sodium: 140 mEq/L (ref 136–145)
TOTAL PROTEIN: 6.2 g/dL — AB (ref 6.4–8.3)

## 2015-01-23 MED ORDER — SODIUM CHLORIDE 0.9 % IJ SOLN
10.0000 mL | INTRAMUSCULAR | Status: DC | PRN
  Filled 2015-01-23: qty 10

## 2015-01-23 MED ORDER — AZITHROMYCIN 250 MG PO TABS: ORAL_TABLET | ORAL | Status: DC

## 2015-01-23 NOTE — Telephone Encounter (Signed)
Sent pof to schedule lab appt and appt with Cyndee today. Placed orders for CBC,Cmet, UA/culture and blood cultures x 2

## 2015-01-23 NOTE — Telephone Encounter (Signed)
Chemo Wednesday, fluids and aloxi yesterday. Fever today 101 about 5 min ago. 99.5 earlier this am. Pt has sinus congestion, blowing nose. No headache. No cough. No nausea. No earache.

## 2015-01-23 NOTE — Patient Instructions (Signed)

## 2015-01-23 NOTE — Progress Notes (Signed)
Pt. Refused flush labs to be drawn by Phlebotomist.

## 2015-01-24 LAB — URINE CULTURE

## 2015-01-25 ENCOUNTER — Encounter: Payer: Self-pay | Admitting: Nurse Practitioner

## 2015-01-25 ENCOUNTER — Telehealth: Payer: Self-pay | Admitting: *Deleted

## 2015-01-25 ENCOUNTER — Ambulatory Visit (HOSPITAL_BASED_OUTPATIENT_CLINIC_OR_DEPARTMENT_OTHER): Payer: BLUE CROSS/BLUE SHIELD

## 2015-01-25 ENCOUNTER — Ambulatory Visit (HOSPITAL_BASED_OUTPATIENT_CLINIC_OR_DEPARTMENT_OTHER): Payer: BLUE CROSS/BLUE SHIELD | Admitting: Nurse Practitioner

## 2015-01-25 ENCOUNTER — Ambulatory Visit: Payer: BLUE CROSS/BLUE SHIELD

## 2015-01-25 VITALS — BP 95/59 | HR 112 | Temp 98.2°F

## 2015-01-25 DIAGNOSIS — J329 Chronic sinusitis, unspecified: Secondary | ICD-10-CM

## 2015-01-25 DIAGNOSIS — C50111 Malignant neoplasm of central portion of right female breast: Secondary | ICD-10-CM

## 2015-01-25 DIAGNOSIS — Z95828 Presence of other vascular implants and grafts: Secondary | ICD-10-CM

## 2015-01-25 DIAGNOSIS — R1112 Projectile vomiting: Secondary | ICD-10-CM

## 2015-01-25 DIAGNOSIS — E860 Dehydration: Secondary | ICD-10-CM | POA: Diagnosis not present

## 2015-01-25 DIAGNOSIS — R112 Nausea with vomiting, unspecified: Secondary | ICD-10-CM | POA: Insufficient documentation

## 2015-01-25 DIAGNOSIS — R197 Diarrhea, unspecified: Secondary | ICD-10-CM

## 2015-01-25 LAB — CBC WITH DIFFERENTIAL/PLATELET
BASO%: 0.2 % (ref 0.0–2.0)
BASOS ABS: 0.1 10*3/uL (ref 0.0–0.1)
EOS%: 0 % (ref 0.0–7.0)
Eosinophils Absolute: 0 10*3/uL (ref 0.0–0.5)
HEMATOCRIT: 33.6 % — AB (ref 34.8–46.6)
HGB: 11.3 g/dL — ABNORMAL LOW (ref 11.6–15.9)
LYMPH#: 2.1 10*3/uL (ref 0.9–3.3)
LYMPH%: 6.7 % — ABNORMAL LOW (ref 14.0–49.7)
MCH: 29.8 pg (ref 25.1–34.0)
MCHC: 33.7 g/dL (ref 31.5–36.0)
MCV: 88.3 fL (ref 79.5–101.0)
MONO#: 1.8 10*3/uL — AB (ref 0.1–0.9)
MONO%: 5.7 % (ref 0.0–14.0)
NEUT#: 27.3 10*3/uL — ABNORMAL HIGH (ref 1.5–6.5)
NEUT%: 87.4 % — AB (ref 38.4–76.8)
PLATELETS: 272 10*3/uL (ref 145–400)
RBC: 3.81 10*6/uL (ref 3.70–5.45)
RDW: 17.5 % — ABNORMAL HIGH (ref 11.2–14.5)
WBC: 31.2 10*3/uL — AB (ref 3.9–10.3)

## 2015-01-25 LAB — COMPREHENSIVE METABOLIC PANEL (CC13)
ALK PHOS: 153 U/L — AB (ref 40–150)
ALT: 54 U/L (ref 0–55)
AST: 33 U/L (ref 5–34)
Albumin: 3.8 g/dL (ref 3.5–5.0)
Anion Gap: 13 mEq/L — ABNORMAL HIGH (ref 3–11)
BUN: 9.9 mg/dL (ref 7.0–26.0)
CO2: 25 mEq/L (ref 22–29)
Calcium: 10.2 mg/dL (ref 8.4–10.4)
Chloride: 102 mEq/L (ref 98–109)
Creatinine: 0.9 mg/dL (ref 0.6–1.1)
EGFR: 85 mL/min/{1.73_m2} — ABNORMAL LOW (ref >90)
Glucose: 103 mg/dl (ref 70–140)
POTASSIUM: 3.7 meq/L (ref 3.5–5.1)
SODIUM: 140 meq/L (ref 136–145)
Total Bilirubin: 0.41 mg/dL (ref 0.20–1.20)
Total Protein: 6.9 g/dL (ref 6.4–8.3)

## 2015-01-25 MED ORDER — PALONOSETRON HCL INJECTION 0.25 MG/5ML
INTRAVENOUS | Status: AC
  Filled 2015-01-25: qty 5

## 2015-01-25 MED ORDER — LORAZEPAM 2 MG/ML IJ SOLN
INTRAMUSCULAR | Status: AC
  Filled 2015-01-25: qty 1

## 2015-01-25 MED ORDER — ONDANSETRON HCL 4 MG/2ML IJ SOLN: 8.0000 mg | INTRAMUSCULAR | Status: DC

## 2015-01-25 MED ORDER — HEPARIN SOD (PORK) LOCK FLUSH 100 UNIT/ML IV SOLN
500.0000 [IU] | Freq: Once | INTRAVENOUS | Status: AC
  Administered 2015-01-25: 500 [IU] via INTRAVENOUS
  Filled 2015-01-25: qty 5

## 2015-01-25 MED ORDER — PALONOSETRON HCL INJECTION 0.25 MG/5ML
0.2500 mg | Freq: Once | INTRAVENOUS | Status: AC
  Administered 2015-01-25: 0.25 mg via INTRAVENOUS

## 2015-01-25 MED ORDER — SODIUM CHLORIDE 0.9 % IJ SOLN
10.0000 mL | INTRAMUSCULAR | Status: DC | PRN
  Filled 2015-01-25: qty 10

## 2015-01-25 MED ORDER — SODIUM CHLORIDE 0.9 % IV SOLN
10.0000 mg | Freq: Once | INTRAVENOUS | Status: AC
  Administered 2015-01-25: 10 mg via INTRAVENOUS
  Filled 2015-01-25: qty 1

## 2015-01-25 MED ORDER — SODIUM CHLORIDE 0.9 % IV SOLN
INTRAVENOUS | Status: AC
  Administered 2015-01-25: 14:00:00 via INTRAVENOUS

## 2015-01-25 MED ORDER — SODIUM CHLORIDE 0.9 % IJ SOLN
10.0000 mL | INTRAMUSCULAR | Status: DC | PRN
Start: 2015-01-25 — End: 2015-01-26
  Administered 2015-01-25: 10 mL via INTRAVENOUS
  Filled 2015-01-25: qty 10

## 2015-01-25 MED ORDER — LORAZEPAM 2 MG/ML IJ SOLN
1.0000 mg | Freq: Once | INTRAMUSCULAR | Status: AC
  Administered 2015-01-25: 0.5 mg via INTRAVENOUS

## 2015-01-25 MED ORDER — SODIUM CHLORIDE 0.9 % IV SOLN
Freq: Once | INTRAVENOUS | Status: AC
  Administered 2015-01-25: 15:00:00 via INTRAVENOUS
  Filled 2015-01-25: qty 4

## 2015-01-25 MED ORDER — LOPERAMIDE HCL 2 MG PO TABS
4.0000 mg | ORAL_TABLET | Freq: Once | ORAL | Status: AC
  Administered 2015-01-25: 4 mg via ORAL
  Filled 2015-01-25: qty 2

## 2015-01-25 NOTE — Assessment & Plan Note (Signed)
Patient developed diarrhea today; despite taking Imodium as directed.  Patient was given Imodium while at the [facility] today.  She also has Lomotil at home as well.  Patient was given IV fluid rehydration while cancer Center today.  She was encouraged to alternate both Imodium and Lomotil at home for diarrhea.

## 2015-01-25 NOTE — Assessment & Plan Note (Signed)
Patient received her last cycle of neoadjuvant carboplatin/Taxotere/Herceptin/Perjeta on 01/16/2015.  She received her Neulasta injection for growth factor support.  The following day.  Patient is scheduled to return on 02/06/2015 for labs, visit, and her next chemotherapy.

## 2015-01-25 NOTE — Assessment & Plan Note (Signed)
Patient has been complaining of chronic nausea and intermittent vomiting; despite taking Zofran and receiving Aloxi intravenously earlier this week.  She has also developed diarrhea today as well.  Patient has been taking intermittent Imodium.  She continues to feel dehydrated today.   Patient was given Zofran, Ativan, and Aloxi IV while the cancer Center today.  She was able to take sips of clear liquids with no further vomiting.  Patient will receive IV fluid rehydration today.  She was also encouraged to push fluids at home is much as possible.

## 2015-01-25 NOTE — Patient Instructions (Signed)

## 2015-01-25 NOTE — Assessment & Plan Note (Signed)
Patient has a history of chronic seasonal allergy symptoms.  However, patient has developed significant nasal congestion within the past few weeks.  She feels that she is also having some sinus drainage; which is causing her to have an occasional cough.  She is complaining of intermittent headaches and some facial tenderness as well.  She has had some low-grade temperatures as well.  On exam.  Patient does have significant nasal congestion and some facial tenderness with palpation.  Oropharynx essentially clear; with no oral lesions or erythema.  Breath sounds clear bilaterally.  Patient with temp of 100.6 today.   Patient most likely has mild chronic sinusitis.  Advised patient that Augmentin is first line anabiotic treatment for sinusitis.  However, patient states that she would prefer to try Zithromax instead; remembering that Augmentin does occasionally upset her stomach.  Patient was given prescription for Zithromax this past Wednesday, 01/23/2015.  She is happy to report that her nasal congestion has slightly improved since initiating the antibiotics 2 days ago.  She denies any recent fevers.

## 2015-01-25 NOTE — Progress Notes (Signed)
Pt in for lab, flush, and a symptom management appt.  Pt getting labs drawn from phlebotomist.  Pt states that she is nauseated and feels like she is going to pass out.  Pt vitals taken.  Drue Second, NP made aware of pt complaints.  Pt sent directly to symptom management appt.

## 2015-01-25 NOTE — Assessment & Plan Note (Addendum)
Patient has a history of chronic seasonal allergy symptoms.  However, patient has developed significant nasal congestion within the past few weeks.  She feels that she is also having some sinus drainage; which is causing her to have an occasional cough.  She is complaining of intermittent headaches and some facial tenderness as well.  She has had some low-grade temperatures as well.  On exam.  Patient does have significant nasal congestion and some facial tenderness with palpation.  Oropharynx essentially clear; with no oral lesions or erythema.  Breath sounds clear bilaterally.  Patient with temp of 100.6 today.   Patient most likely has mild chronic sinusitis.  Advised patient that Augmentin is first line anabiotic treatment for sinusitis.  However, patient states that she would prefer to try Zithromax instead; remembering that Augmentin does occasionally upset her stomach.  Advised patient would prescribe Z-Pak for her; but to call/return if symptoms persist or worsen.

## 2015-01-25 NOTE — Progress Notes (Signed)
SYMPTOM MANAGEMENT CLINIC   HPI: Robin Miles 39 y.o. female diagnosed with breast cancer.  Currently undergoing neoadjuvant carboplatin/Taxotere/Herceptin/Perjeta chemotherapy regimen.  Patient has a history of chronic seasonal allergy symptoms.  However, patient has developed significant nasal congestion within the past few weeks.  She feels that she is also having some sinus drainage; which is causing her to have an occasional cough.  She is complaining of intermittent headaches and some facial tenderness as well.  She has had some low-grade temperatures as well.  HPI  ROS  Past Medical History  Diagnosis Date  . Breast cancer, right breast   . Breast cancer 2016    ER-/PR-/Her2+  . Family history of breast cancer   . PALB2-related breast cancer     Past Surgical History  Procedure Laterality Date  . Tonsilectomy/adenoidectomy with myringotomy    . Tubal ligation    . Knee reconstruction Right   . Tonsillectomy    . Portacath placement N/A 11/30/2014    Procedure: INSERTION PORT-A-CATH;  Surgeon: Excell Seltzer, MD;  Location: [facility];  Service: General;  Laterality: N/A;    has Cancer of central portion of right female breast; Family history of breast cancer; Genetic testing; PALB2-related breast cancer; Anxiety; and Sinusitis on her problem list.    has No Known Allergies.    Medication List       This list is accurate as of: 01/23/15 11:59 PM.  Always use your most recent med list.               ALPRAZolam 0.5 MG tablet  Commonly known as:  XANAX  Take 0.5 mg by mouth daily as needed for anxiety.     azithromycin 250 MG tablet  Commonly known as:  ZITHROMAX Z-PAK  Take 2 tabs (500 mg) jPO on day # 1; then take 1 tab (250 mg) PO QD till gone.     dexamethasone 4 MG tablet  Commonly known as:  DECADRON  Take 1 tablet (4 mg total) by mouth 2 (two) times daily. Start the day before Taxotere. Then again the day after chemo for 3 days.     diphenoxylate-atropine 2.5-0.025 MG per tablet  Commonly known as:  LOMOTIL  Take 1 tablet by mouth 4 (four) times daily as needed for diarrhea or loose stools.     HYDROcodone-acetaminophen 5-325 MG per tablet  Commonly known as:  NORCO/VICODIN  Take 1-2 tablets by mouth every 4 (four) hours as needed for moderate pain or severe pain.     lidocaine-prilocaine cream  Commonly known as:  EMLA  Apply to affected area once     loratadine 10 MG tablet  Commonly known as:  CLARITIN  Take 10 mg by mouth daily.     LORazepam 0.5 MG tablet  Commonly known as:  ATIVAN  Take 1 tablet (0.5 mg total) by mouth at bedtime.     magic mouthwash w/lidocaine Soln  5-35m  Four times daily as needed for mouth discomfort     ondansetron 8 MG tablet  Commonly known as:  ZOFRAN  Take 1 tablet (8 mg total) by mouth 2 (two) times daily. Start the day after chemo for 3 days. Then take as needed for nausea or vomiting.     pegfilgrastim 6 MG/0.6ML injection  Commonly known as:  NEULASTA  Inject 6 mg into the skin once.     PRESCRIPTION MEDICATION  Chemo--[facility].     PRILOSEC OTC PO  Take 1  tablet by mouth daily.     PROBIOTIC PO  Take 1 capsule by mouth daily.     prochlorperazine 10 MG tablet  Commonly known as:  COMPAZINE  Take 1 tablet (10 mg total) by mouth every 6 (six) hours as needed (Nausea or vomiting).     promethazine 25 MG suppository  Commonly known as:  PHENERGAN  Place 1 suppository (25 mg total) rectally every 6 (six) hours as needed for nausea or vomiting.     promethazine 25 MG tablet  Commonly known as:  PHENERGAN  Take 1 tablet (25 mg total) by mouth every 6 (six) hours as needed for nausea or vomiting.     UNABLE TO FIND  1 each by Other route once. Dispense cranial prosthesis per medical necessity for alopecia due to chemotherapy secondary to known breast cancer diagnosis.         PHYSICAL EXAMINATION  Oncology Vitals 01/25/2015 01/23/2015 01/22/2015 01/22/2015 01/16/2015  01/02/2015 01/02/2015  Height - - - - 159 cm - -  Weight - 50.803 kg - - 51.302 kg - -  Weight (lbs) - 112 lbs - - 113 lbs 2 oz - -  BMI (kg/m2) - - - - 20.36 kg/m2 - -  Temp 98.2 100.6 98.4 98.1 97.8 98.1 98.1  Pulse 112 105 79 72 77 77 99  Resp - 18 18 18 18 17 18   SpO2 100 100 100 100 100 99 99  BSA (m2) - - - - 1.5 m2 - -   BP Readings from Last 3 Encounters:  01/25/15 95/59  01/23/15 115/60  01/22/15 90/50    Physical Exam  Constitutional: She is oriented to person, place, and time and well-developed, well-nourished, and in no distress.  HENT:  Head: Normocephalic and atraumatic.  Mouth/Throat: Oropharynx is clear and moist.  Patient noted to have nasal congestion and facial tenderness with palpation.  Eyes: Conjunctivae and EOM are normal. Pupils are equal, round, and reactive to light. Right eye exhibits no discharge. Left eye exhibits no discharge. No scleral icterus.  Neck: Normal range of motion. Neck supple. No JVD present. No tracheal deviation present. No thyromegaly present.  Cardiovascular: Normal heart sounds and intact distal pulses.   Tachycardic  Pulmonary/Chest: Effort normal and breath sounds normal. No respiratory distress. She has no wheezes. She has no rales. She exhibits no tenderness.  Abdominal: Soft. Bowel sounds are normal. She exhibits no distension and no mass. There is no tenderness. There is no rebound and no guarding.  Musculoskeletal: Normal range of motion. She exhibits no edema or tenderness.  Lymphadenopathy:    She has no cervical adenopathy.  Neurological: She is alert and oriented to person, place, and time. Gait normal.  Skin: Skin is warm and dry. No rash noted. No erythema. There is pallor.  Psychiatric: Affect normal.  Nursing note and vitals reviewed.   LABORATORY DATA:. Appointment on 01/23/2015  Component Date Value Ref Range Status  . WBC 01/23/2015 11.6* 3.9 - 10.3 10e3/uL Final  . NEUT# 01/23/2015 7.9* 1.5 - 6.5 10e3/uL Final    . HGB 01/23/2015 10.7* 11.6 - 15.9 g/dL Final  . HCT 01/23/2015 31.3* 34.8 - 46.6 % Final  . Platelets 01/23/2015 305  145 - 400 10e3/uL Final  . MCV 01/23/2015 88.2  79.5 - 101.0 fL Final  . MCH 01/23/2015 30.1  25.1 - 34.0 pg Final  . MCHC 01/23/2015 34.2  31.5 - 36.0 g/dL Final  . RBC 01/23/2015 3.55* 3.70 - 5.45 10e6/uL  Final  . RDW 01/23/2015 17.0* 11.2 - 14.5 % Final  . lymph# 01/23/2015 2.7  0.9 - 3.3 10e3/uL Final  . MONO# 01/23/2015 0.9  0.1 - 0.9 10e3/uL Final  . Eosinophils Absolute 01/23/2015 0.0  0.0 - 0.5 10e3/uL Final  . Basophils Absolute 01/23/2015 0.0  0.0 - 0.1 10e3/uL Final  . NEUT% 01/23/2015 68.4  38.4 - 76.8 % Final  . LYMPH% 01/23/2015 23.4  14.0 - 49.7 % Final  . MONO% 01/23/2015 7.9  0.0 - 14.0 % Final  . EOS% 01/23/2015 0.1  0.0 - 7.0 % Final  . BASO% 01/23/2015 0.2  0.0 - 2.0 % Final  . Sodium 01/23/2015 140  136 - 145 mEq/L Final  . Potassium 01/23/2015 3.9  3.5 - 5.1 mEq/L Final  . Chloride 01/23/2015 103  98 - 109 mEq/L Final  . CO2 01/23/2015 26  22 - 29 mEq/L Final  . Glucose 01/23/2015 115  70 - 140 mg/dl Final  . BUN 01/23/2015 10.8  7.0 - 26.0 mg/dL Final  . Creatinine 01/23/2015 0.9  0.6 - 1.1 mg/dL Final  . Total Bilirubin 01/23/2015 0.46  0.20 - 1.20 mg/dL Final  . Alkaline Phosphatase 01/23/2015 87  40 - 150 U/L Final  . AST 01/23/2015 22  5 - 34 U/L Final  . ALT 01/23/2015 26  0 - 55 U/L Final  . Total Protein 01/23/2015 6.2* 6.4 - 8.3 g/dL Final  . Albumin 01/23/2015 3.7  3.5 - 5.0 g/dL Final  . Calcium 01/23/2015 9.7  8.4 - 10.4 mg/dL Final  . Anion Gap 01/23/2015 11  3 - 11 mEq/L Final  . EGFR 01/23/2015 84* >90 ml/min/1.73 m2 Final   eGFR is calculated using the CKD-EPI Creatinine Equation (2009)  . Glucose 01/23/2015 Negative  Negative mg/dL Final  . Bilirubin (Urine) 01/23/2015 Negative  Negative Final  . Ketones 01/23/2015 Negative  Negative mg/dL Final  . Specific Gravity, Urine 01/23/2015 1.005  1.003 - 1.035 Final  . Blood  01/23/2015 Negative  Negative Final  . pH 01/23/2015 8.0  4.6 - 8.0 Final  . Protein 01/23/2015 Negative  Negative- <30 mg/dL Final  . Urobilinogen, UR 01/23/2015 0.2  0.2 - 1 mg/dL Final  . Nitrite 01/23/2015 Negative  Negative Final  . Leukocyte Esterase 01/23/2015 Negative  Negative Final  . RBC / HPF 01/23/2015 0-2  0 - 2 Final  . WBC, UA 01/23/2015 0-2  0 - 2 Final  . Bacteria, UA 01/23/2015 Trace  Negative- Trace Final  . Epithelial Cells 01/23/2015 Occasional  Negative- Few Final  . Urine Culture, Routine 01/23/2015 Culture, Urine   Final   Comment: Final - ===== COLONY COUNT: ===== NO GROWTH NO GROWTH      RADIOGRAPHIC STUDIES: No results found.  ASSESSMENT/PLAN:    Cancer of central portion of right female breast Patient received her last cycle of neoadjuvant carboplatin/Taxotere/Herceptin/Perjeta on 01/16/2015.  She received her Neulasta injection for growth factor support.  The following day.  Patient is scheduled to return on 02/06/2015 for labs, visit, and her next chemotherapy.  Sinusitis Patient has a history of chronic seasonal allergy symptoms.  However, patient has developed significant nasal congestion within the past few weeks.  She feels that she is also having some sinus drainage; which is causing her to have an occasional cough.  She is complaining of intermittent headaches and some facial tenderness as well.  She has had some low-grade temperatures as well.  On exam.  Patient does have significant  nasal congestion and some facial tenderness with palpation.  Oropharynx essentially clear; with no oral lesions or erythema.  Breath sounds clear bilaterally.  Patient with temp of 100.6 today.   Patient most likely has mild chronic sinusitis.  Advised patient that Augmentin is first line anabiotic treatment for sinusitis.  However, patient states that she would prefer to try Zithromax instead; remembering that Augmentin does occasionally upset her stomach.  Advised  patient would prescribe Z-Pak for her; but to call/return if symptoms persist or worsen.   Patient stated understanding of all instructions; and was in agreement with this plan of care. The patient knows to call the clinic with any problems, questions or concerns.   Review/collaboration with Dr. Lindi Adie regarding all aspects of patient's visit today.   Total time spent with patient was 25 minutes;  with greater than 75 percent of that time spent in face to face counseling regarding patient's symptoms,  and coordination of care and follow up.  Disclaimer: This note was dictated with voice recognition software. Similar sounding words can inadvertently be transcribed and may not be corrected upon review.   Drue Second, NP 01/25/2015

## 2015-01-25 NOTE — Telephone Encounter (Signed)
TC from pt's mother stating Robin Miles is very nauseated and has vomited 5 x today. She has had compazine, zofran and a phenergan suppository without relief. She was here at the clinic earlier this week for IVF's and then for an antibiotic for possible sinus infection. She is on a zpak now for that. Denies fever. Spoke with Selena Lesser, NP who saw her earlier this week. Advised to come in for blood cultures x2 (port and peripheral) and then be seen by Limmie Patricia, NP

## 2015-01-25 NOTE — Progress Notes (Signed)
SYMPTOM MANAGEMENT CLINIC   HPI: Robin Miles 39 y.o. female diagnosed with rest cancer.  Currently undergoing neoadjuvant carboplatin/Taxotere/Herceptin/Perjeta chemotherapy regimen.  Patient was seen this past Wednesday, 01/23/2015 for complaint of sinusitis.  She was prescribed Zithromax for treatment of the sinusitis.  She is happy to report that the nasal congestion has slightly improved since starting the antibiotics.  However, patient has progressive nausea and vomiting; has also developed some diarrhea just today.  She has been taking Zofran at home with minimal effectiveness.  She is also been taking occasional Imodium for her diarrhea.  She feels fairly dehydrated today.  Patient denies any other new symptoms.  She denies any recent fevers or chills. HPI  ROS  Past Medical History  Diagnosis Date  . Breast cancer, right breast   . Breast cancer 2016    ER-/PR-/Her2+  . Family history of breast cancer   . PALB2-related breast cancer     Past Surgical History  Procedure Laterality Date  . Tonsilectomy/adenoidectomy with myringotomy    . Tubal ligation    . Knee reconstruction Right   . Tonsillectomy    . Portacath placement N/A 11/30/2014    Procedure: INSERTION PORT-A-CATH;  Surgeon: Excell Seltzer, MD;  Location: [facility];  Service: General;  Laterality: N/A;    has Cancer of central portion of right female breast; Family history of breast cancer; Genetic testing; PALB2-related breast cancer; Anxiety; Sinusitis; Nausea with vomiting; Diarrhea; and Dehydration on her problem list.    has No Known Allergies.    Medication List       This list is accurate as of: 01/25/15 10:16 PM.  Always use your most recent med list.               ALPRAZolam 0.5 MG tablet  Commonly known as:  XANAX  Take 0.5 mg by mouth daily as needed for anxiety.     azithromycin 250 MG tablet  Commonly known as:  ZITHROMAX Z-PAK  Take 2 tabs (500 mg) jPO on day #  1; then take 1 tab (250 mg) PO QD till gone.     dexamethasone 4 MG tablet  Commonly known as:  DECADRON  Take 1 tablet (4 mg total) by mouth 2 (two) times daily. Start the day before Taxotere. Then again the day after chemo for 3 days.     diphenoxylate-atropine 2.5-0.025 MG per tablet  Commonly known as:  LOMOTIL  Take 1 tablet by mouth 4 (four) times daily as needed for diarrhea or loose stools.     HYDROcodone-acetaminophen 5-325 MG per tablet  Commonly known as:  NORCO/VICODIN  Take 1-2 tablets by mouth every 4 (four) hours as needed for moderate pain or severe pain.     lidocaine-prilocaine cream  Commonly known as:  EMLA  Apply to affected area once     loratadine 10 MG tablet  Commonly known as:  CLARITIN  Take 10 mg by mouth daily.     LORazepam 0.5 MG tablet  Commonly known as:  ATIVAN  Take 1 tablet (0.5 mg total) by mouth at bedtime.     magic mouthwash w/lidocaine Soln  5-41m  Four times daily as needed for mouth discomfort     ondansetron 8 MG tablet  Commonly known as:  ZOFRAN  Take 1 tablet (8 mg total) by mouth 2 (two) times daily. Start the day after chemo for 3 days. Then take as needed for nausea or vomiting.  pegfilgrastim 6 MG/0.6ML injection  Commonly known as:  NEULASTA  Inject 6 mg into the skin once.     PRESCRIPTION MEDICATION  Chemo--[facility].     PRILOSEC OTC PO  Take 1 tablet by mouth daily.     PROBIOTIC PO  Take 1 capsule by mouth daily.     prochlorperazine 10 MG tablet  Commonly known as:  COMPAZINE  Take 1 tablet (10 mg total) by mouth every 6 (six) hours as needed (Nausea or vomiting).     promethazine 25 MG suppository  Commonly known as:  PHENERGAN  Place 1 suppository (25 mg total) rectally every 6 (six) hours as needed for nausea or vomiting.     promethazine 25 MG tablet  Commonly known as:  PHENERGAN  Take 1 tablet (25 mg total) by mouth every 6 (six) hours as needed for nausea or vomiting.     UNABLE TO FIND  1 each  by Other route once. Dispense cranial prosthesis per medical necessity for alopecia due to chemotherapy secondary to known breast cancer diagnosis.         PHYSICAL EXAMINATION  Oncology Vitals 01/25/2015 01/23/2015 01/22/2015 01/22/2015 01/16/2015 01/02/2015 01/02/2015  Height - - - - 159 cm - -  Weight - 50.803 kg - - 51.302 kg - -  Weight (lbs) - 112 lbs - - 113 lbs 2 oz - -  BMI (kg/m2) - - - - 20.36 kg/m2 - -  Temp 98.2 100.6 98.4 98.1 97.8 98.1 98.1  Pulse 112 105 79 72 77 77 99  Resp - 18 18 18 18 17 18   SpO2 100 100 100 100 100 99 99  BSA (m2) - - - - 1.5 m2 - -   BP Readings from Last 3 Encounters:  01/25/15 95/59  01/23/15 115/60  01/22/15 90/50    Physical Exam  Constitutional: She is oriented to person, place, and time.  Patient appears fatigued, frail, and chronically ill.  HENT:  Head: Normocephalic and atraumatic.  Continues with moderate nasal congestion; but it does seem slightly improved from previous exam earlier this week.  Eyes: Conjunctivae and EOM are normal. Pupils are equal, round, and reactive to light. Right eye exhibits no discharge. Left eye exhibits no discharge. No scleral icterus.  Neck: Normal range of motion.  Pulmonary/Chest: Effort normal. No respiratory distress.  Musculoskeletal: Normal range of motion. She exhibits no edema or tenderness.  Neurological: She is alert and oriented to person, place, and time. Gait normal.  Skin: Skin is warm and dry. There is pallor.  Psychiatric: Affect normal.  Nursing note and vitals reviewed.   LABORATORY DATA:. Appointment on 01/25/2015  Component Date Value Ref Range Status  . WBC 01/25/2015 31.2* 3.9 - 10.3 10e3/uL Final  . NEUT# 01/25/2015 27.3* 1.5 - 6.5 10e3/uL Final  . HGB 01/25/2015 11.3* 11.6 - 15.9 g/dL Final  . HCT 01/25/2015 33.6* 34.8 - 46.6 % Final  . Platelets 01/25/2015 272  145 - 400 10e3/uL Final  . MCV 01/25/2015 88.3  79.5 - 101.0 fL Final  . MCH 01/25/2015 29.8  25.1 - 34.0 pg Final    . MCHC 01/25/2015 33.7  31.5 - 36.0 g/dL Final  . RBC 01/25/2015 3.81  3.70 - 5.45 10e6/uL Final  . RDW 01/25/2015 17.5* 11.2 - 14.5 % Final  . lymph# 01/25/2015 2.1  0.9 - 3.3 10e3/uL Final  . MONO# 01/25/2015 1.8* 0.1 - 0.9 10e3/uL Final  . Eosinophils Absolute 01/25/2015 0.0  0.0 - 0.5 10e3/uL  Final  . Basophils Absolute 01/25/2015 0.1  0.0 - 0.1 10e3/uL Final  . NEUT% 01/25/2015 87.4* 38.4 - 76.8 % Final  . LYMPH% 01/25/2015 6.7* 14.0 - 49.7 % Final  . MONO% 01/25/2015 5.7  0.0 - 14.0 % Final  . EOS% 01/25/2015 0.0  0.0 - 7.0 % Final  . BASO% 01/25/2015 0.2  0.0 - 2.0 % Final  . Sodium 01/25/2015 140  136 - 145 mEq/L Final  . Potassium 01/25/2015 3.7  3.5 - 5.1 mEq/L Final  . Chloride 01/25/2015 102  98 - 109 mEq/L Final  . CO2 01/25/2015 25  22 - 29 mEq/L Final  . Glucose 01/25/2015 103  70 - 140 mg/dl Final  . BUN 01/25/2015 9.9  7.0 - 26.0 mg/dL Final  . Creatinine 01/25/2015 0.9  0.6 - 1.1 mg/dL Final  . Total Bilirubin 01/25/2015 0.41  0.20 - 1.20 mg/dL Final  . Alkaline Phosphatase 01/25/2015 153* 40 - 150 U/L Final  . AST 01/25/2015 33  5 - 34 U/L Final  . ALT 01/25/2015 54  0 - 55 U/L Final  . Total Protein 01/25/2015 6.9  6.4 - 8.3 g/dL Final  . Albumin 01/25/2015 3.8  3.5 - 5.0 g/dL Final  . Calcium 01/25/2015 10.2  8.4 - 10.4 mg/dL Final  . Anion Gap 01/25/2015 13* 3 - 11 mEq/L Final  . EGFR 01/25/2015 85* >90 ml/min/1.73 m2 Final   eGFR is calculated using the CKD-EPI Creatinine Equation (2009)  Appointment on 01/23/2015  Component Date Value Ref Range Status  . WBC 01/23/2015 11.6* 3.9 - 10.3 10e3/uL Final  . NEUT# 01/23/2015 7.9* 1.5 - 6.5 10e3/uL Final  . HGB 01/23/2015 10.7* 11.6 - 15.9 g/dL Final  . HCT 01/23/2015 31.3* 34.8 - 46.6 % Final  . Platelets 01/23/2015 305  145 - 400 10e3/uL Final  . MCV 01/23/2015 88.2  79.5 - 101.0 fL Final  . MCH 01/23/2015 30.1  25.1 - 34.0 pg Final  . MCHC 01/23/2015 34.2  31.5 - 36.0 g/dL Final  . RBC 01/23/2015 3.55*  3.70 - 5.45 10e6/uL Final  . RDW 01/23/2015 17.0* 11.2 - 14.5 % Final  . lymph# 01/23/2015 2.7  0.9 - 3.3 10e3/uL Final  . MONO# 01/23/2015 0.9  0.1 - 0.9 10e3/uL Final  . Eosinophils Absolute 01/23/2015 0.0  0.0 - 0.5 10e3/uL Final  . Basophils Absolute 01/23/2015 0.0  0.0 - 0.1 10e3/uL Final  . NEUT% 01/23/2015 68.4  38.4 - 76.8 % Final  . LYMPH% 01/23/2015 23.4  14.0 - 49.7 % Final  . MONO% 01/23/2015 7.9  0.0 - 14.0 % Final  . EOS% 01/23/2015 0.1  0.0 - 7.0 % Final  . BASO% 01/23/2015 0.2  0.0 - 2.0 % Final  . Sodium 01/23/2015 140  136 - 145 mEq/L Final  . Potassium 01/23/2015 3.9  3.5 - 5.1 mEq/L Final  . Chloride 01/23/2015 103  98 - 109 mEq/L Final  . CO2 01/23/2015 26  22 - 29 mEq/L Final  . Glucose 01/23/2015 115  70 - 140 mg/dl Final  . BUN 01/23/2015 10.8  7.0 - 26.0 mg/dL Final  . Creatinine 01/23/2015 0.9  0.6 - 1.1 mg/dL Final  . Total Bilirubin 01/23/2015 0.46  0.20 - 1.20 mg/dL Final  . Alkaline Phosphatase 01/23/2015 87  40 - 150 U/L Final  . AST 01/23/2015 22  5 - 34 U/L Final  . ALT 01/23/2015 26  0 - 55 U/L Final  . Total Protein 01/23/2015 6.2* 6.4 -  8.3 g/dL Final  . Albumin 01/23/2015 3.7  3.5 - 5.0 g/dL Final  . Calcium 01/23/2015 9.7  8.4 - 10.4 mg/dL Final  . Anion Gap 01/23/2015 11  3 - 11 mEq/L Final  . EGFR 01/23/2015 84* >90 ml/min/1.73 m2 Final   eGFR is calculated using the CKD-EPI Creatinine Equation (2009)  . Glucose 01/23/2015 Negative  Negative mg/dL Final  . Bilirubin (Urine) 01/23/2015 Negative  Negative Final  . Ketones 01/23/2015 Negative  Negative mg/dL Final  . Specific Gravity, Urine 01/23/2015 1.005  1.003 - 1.035 Final  . Blood 01/23/2015 Negative  Negative Final  . pH 01/23/2015 8.0  4.6 - 8.0 Final  . Protein 01/23/2015 Negative  Negative- <30 mg/dL Final  . Urobilinogen, UR 01/23/2015 0.2  0.2 - 1 mg/dL Final  . Nitrite 01/23/2015 Negative  Negative Final  . Leukocyte Esterase 01/23/2015 Negative  Negative Final  . RBC / HPF  01/23/2015 0-2  0 - 2 Final  . WBC, UA 01/23/2015 0-2  0 - 2 Final  . Bacteria, UA 01/23/2015 Trace  Negative- Trace Final  . Epithelial Cells 01/23/2015 Occasional  Negative- Few Final  . Urine Culture, Routine 01/23/2015 Culture, Urine   Final   Comment: Final - ===== COLONY COUNT: ===== NO GROWTH NO GROWTH      RADIOGRAPHIC STUDIES: No results found.  ASSESSMENT/PLAN:    Cancer of central portion of right female breast Patient received her last cycle of neoadjuvant carboplatin/Taxotere/Herceptin/Perjeta on 01/16/2015.  She received her Neulasta injection for growth factor support the following day.  Patient is scheduled to return on 02/06/2015 for labs, visit, and her next chemotherapy.    Dehydration Patient has been complaining of chronic nausea and intermittent vomiting; despite taking Zofran and receiving Aloxi intravenously earlier this week.  She has also developed diarrhea today as well.  Patient has been taking intermittent Imodium.  She continues to feel dehydrated today.  Patient will receive IV fluid rehydration today.  She was also encouraged to push fluids at home is much as possible.  Diarrhea Patient developed diarrhea today; despite taking Imodium as directed.  Patient was given Imodium while at the [place] today.  She also has Lomotil at home as well.  Patient was given IV fluid rehydration while cancer Center today.  She was encouraged to alternate both Imodium and Lomotil at home for diarrhea.  Nausea with vomiting Patient has been complaining of chronic nausea and intermittent vomiting; despite taking Zofran and receiving Aloxi intravenously earlier this week.  She has also developed diarrhea today as well.  Patient has been taking intermittent Imodium.  She continues to feel dehydrated today.   Patient was given Zofran, Ativan, and Aloxi IV while the cancer Center today.  She was able to take sips of clear liquids with no further vomiting.  Patient will  receive IV fluid rehydration today.  She was also encouraged to push fluids at home is much as possible.   Sinusitis Patient has a history of chronic seasonal allergy symptoms.  However, patient has developed significant nasal congestion within the past few weeks.  She feels that she is also having some sinus drainage; which is causing her to have an occasional cough.  She is complaining of intermittent headaches and some facial tenderness as well.  She has had some low-grade temperatures as well.  On exam.  Patient does have significant nasal congestion and some facial tenderness with palpation.  Oropharynx essentially clear; with no oral lesions or erythema.  Breath sounds clear bilaterally.  Patient with temp of 100.6 today.   Patient most likely has mild chronic sinusitis.  Advised patient that Augmentin is first line anabiotic treatment for sinusitis.  However, patient states that she would prefer to try Zithromax instead; remembering that Augmentin does occasionally upset her stomach.  Patient was given prescription for Zithromax this past Wednesday, 01/23/2015.  She is happy to report that her nasal congestion has slightly improved since initiating the antibiotics 2 days ago.  She denies any recent fevers.     Patient stated understanding of all instructions; and was in agreement with this plan of care. The patient knows to call the clinic with any problems, questions or concerns.   Review/collaboration with Dr. Lindi Adie regarding all aspects of patient's visit today.   Total time spent with patient was 40 minutes;  with greater than 75 percent of that time spent in face to face counseling regarding patient's symptoms,  and coordination of care and follow up.  Disclaimer: This note was dictated with voice recognition software. Similar sounding words can inadvertently be transcribed and may not be corrected upon review.   Drue Second, NP 01/25/2015

## 2015-01-25 NOTE — Assessment & Plan Note (Signed)
Patient received her last cycle of neoadjuvant carboplatin/Taxotere/Herceptin/Perjeta on 01/16/2015.  She received her Neulasta injection for growth factor support the following day.  Patient is scheduled to return on 02/06/2015 for labs, visit, and her next chemotherapy.

## 2015-01-25 NOTE — Assessment & Plan Note (Signed)
Patient has been complaining of chronic nausea and intermittent vomiting; despite taking Zofran and receiving Aloxi intravenously earlier this week.  She has also developed diarrhea today as well.  Patient has been taking intermittent Imodium.  She continues to feel dehydrated today.  Patient will receive IV fluid rehydration today.  She was also encouraged to push fluids at home is much as possible.

## 2015-01-28 ENCOUNTER — Telehealth: Payer: Self-pay | Admitting: *Deleted

## 2015-01-28 NOTE — Telephone Encounter (Signed)
Lm for rtn call to check on pt status from Friday visit with Selena Lesser, FNP

## 2015-01-31 LAB — CULTURE, BLOOD (SINGLE)

## 2015-02-05 NOTE — Assessment & Plan Note (Signed)
Right breast retroareolar mass 3.7 x 2.8 x 1.7 cm involving anterior two thirds of the breast, T2 N0 M0 stage II a clinical stage, ER 0%, appears 0%, HER-2 positive ratio 3.53  Current Treatment: TCHP today is cycle 4 day 1 Chemo monitoring: 1. Diarrhea: I added Lomotil to Imodium 2. Fatigue over the weekend 3. Nausea during chemotherapy improved with Aloxi but patient had delayed nausea and vomiting. We added Emend to her regimen 4. Bone pain from neulasta: recommended claritin and NSAIDs PRN 5. 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 6. Depression: started on venlafaxine 37.21m daily 6. Abnormal bilirubin: Bilirubin has been up and down 7. Sinusitis: treated with Z-pak  Monitoring closely for chemotherapy side effects ECHO 55-60% EF Return to clinic in 1 week for fluids and Aloxi And in 2 weeks for follow-up with me in the clinic

## 2015-02-06 ENCOUNTER — Other Ambulatory Visit (HOSPITAL_BASED_OUTPATIENT_CLINIC_OR_DEPARTMENT_OTHER): Payer: BLUE CROSS/BLUE SHIELD

## 2015-02-06 ENCOUNTER — Other Ambulatory Visit: Payer: Self-pay | Admitting: *Deleted

## 2015-02-06 ENCOUNTER — Encounter: Payer: Self-pay | Admitting: Hematology and Oncology

## 2015-02-06 ENCOUNTER — Ambulatory Visit (HOSPITAL_BASED_OUTPATIENT_CLINIC_OR_DEPARTMENT_OTHER): Payer: BLUE CROSS/BLUE SHIELD | Admitting: Hematology and Oncology

## 2015-02-06 ENCOUNTER — Encounter: Payer: Self-pay | Admitting: General Practice

## 2015-02-06 ENCOUNTER — Ambulatory Visit (HOSPITAL_BASED_OUTPATIENT_CLINIC_OR_DEPARTMENT_OTHER): Payer: BLUE CROSS/BLUE SHIELD

## 2015-02-06 ENCOUNTER — Telehealth: Payer: Self-pay | Admitting: Hematology and Oncology

## 2015-02-06 ENCOUNTER — Encounter: Payer: Self-pay | Admitting: *Deleted

## 2015-02-06 VITALS — BP 102/59 | HR 87 | Temp 98.1°F | Resp 18 | Ht 62.5 in | Wt 113.3 lb

## 2015-02-06 DIAGNOSIS — R110 Nausea: Secondary | ICD-10-CM | POA: Diagnosis not present

## 2015-02-06 DIAGNOSIS — R197 Diarrhea, unspecified: Secondary | ICD-10-CM | POA: Diagnosis not present

## 2015-02-06 DIAGNOSIS — Z5112 Encounter for antineoplastic immunotherapy: Secondary | ICD-10-CM | POA: Diagnosis not present

## 2015-02-06 DIAGNOSIS — Z5111 Encounter for antineoplastic chemotherapy: Secondary | ICD-10-CM

## 2015-02-06 DIAGNOSIS — Z5189 Encounter for other specified aftercare: Secondary | ICD-10-CM

## 2015-02-06 DIAGNOSIS — C50111 Malignant neoplasm of central portion of right female breast: Secondary | ICD-10-CM | POA: Diagnosis not present

## 2015-02-06 DIAGNOSIS — D696 Thrombocytopenia, unspecified: Secondary | ICD-10-CM

## 2015-02-06 DIAGNOSIS — Z171 Estrogen receptor negative status [ER-]: Secondary | ICD-10-CM

## 2015-02-06 LAB — COMPREHENSIVE METABOLIC PANEL (CC13)
ALT: 19 U/L (ref 0–55)
AST: 12 U/L (ref 5–34)
Albumin: 3.8 g/dL (ref 3.5–5.0)
Alkaline Phosphatase: 77 U/L (ref 40–150)
Anion Gap: 12 mEq/L — ABNORMAL HIGH (ref 3–11)
BUN: 13.9 mg/dL (ref 7.0–26.0)
CALCIUM: 9.9 mg/dL (ref 8.4–10.4)
CHLORIDE: 105 meq/L (ref 98–109)
CO2: 24 mEq/L (ref 22–29)
Creatinine: 0.8 mg/dL (ref 0.6–1.1)
GLUCOSE: 169 mg/dL — AB (ref 70–140)
Potassium: 3.6 mEq/L (ref 3.5–5.1)
Sodium: 140 mEq/L (ref 136–145)
Total Bilirubin: 0.46 mg/dL (ref 0.20–1.20)
Total Protein: 6.3 g/dL — ABNORMAL LOW (ref 6.4–8.3)

## 2015-02-06 LAB — CBC WITH DIFFERENTIAL/PLATELET
BASO%: 0 % (ref 0.0–2.0)
Basophils Absolute: 0 10*3/uL (ref 0.0–0.1)
EOS%: 0 % (ref 0.0–7.0)
Eosinophils Absolute: 0 10*3/uL (ref 0.0–0.5)
HCT: 28.7 % — ABNORMAL LOW (ref 34.8–46.6)
HGB: 9.8 g/dL — ABNORMAL LOW (ref 11.6–15.9)
LYMPH%: 14.8 % (ref 14.0–49.7)
MCH: 31.6 pg (ref 25.1–34.0)
MCHC: 34.1 g/dL (ref 31.5–36.0)
MCV: 92.6 fL (ref 79.5–101.0)
MONO#: 0.4 10*3/uL (ref 0.1–0.9)
MONO%: 4.8 % (ref 0.0–14.0)
NEUT#: 6.4 10*3/uL (ref 1.5–6.5)
NEUT%: 80.4 % — ABNORMAL HIGH (ref 38.4–76.8)
Platelets: 136 10*3/uL — ABNORMAL LOW (ref 145–400)
RBC: 3.1 10*6/uL — ABNORMAL LOW (ref 3.70–5.45)
RDW: 18.9 % — ABNORMAL HIGH (ref 11.2–14.5)
WBC: 7.9 10*3/uL (ref 3.9–10.3)
lymph#: 1.2 10*3/uL (ref 0.9–3.3)

## 2015-02-06 MED ORDER — PALONOSETRON HCL INJECTION 0.25 MG/5ML
0.2500 mg | Freq: Once | INTRAVENOUS | Status: AC
  Administered 2015-02-06: 0.25 mg via INTRAVENOUS

## 2015-02-06 MED ORDER — PALONOSETRON HCL INJECTION 0.25 MG/5ML
INTRAVENOUS | Status: AC
Start: 2015-02-06 — End: 2015-02-06
  Filled 2015-02-06: qty 5

## 2015-02-06 MED ORDER — PEGFILGRASTIM 6 MG/0.6ML ~~LOC~~ PSKT
6.0000 mg | PREFILLED_SYRINGE | Freq: Once | SUBCUTANEOUS | Status: AC
Start: 2015-02-06 — End: 2015-02-06
  Administered 2015-02-06: 6 mg via SUBCUTANEOUS
  Filled 2015-02-06: qty 0.6

## 2015-02-06 MED ORDER — DOCETAXEL CHEMO INJECTION 160 MG/16ML
75.0000 mg/m2 | Freq: Once | INTRAVENOUS | Status: AC
  Administered 2015-02-06: 110 mg via INTRAVENOUS
  Filled 2015-02-06: qty 11

## 2015-02-06 MED ORDER — SODIUM CHLORIDE 0.9 % IV SOLN
600.0000 mg | Freq: Once | INTRAVENOUS | Status: AC
  Administered 2015-02-06: 600 mg via INTRAVENOUS
  Filled 2015-02-06: qty 60

## 2015-02-06 MED ORDER — HEPARIN SOD (PORK) LOCK FLUSH 100 UNIT/ML IV SOLN
500.0000 [IU] | Freq: Once | INTRAVENOUS | Status: AC | PRN
  Administered 2015-02-06: 500 [IU]
  Filled 2015-02-06: qty 5

## 2015-02-06 MED ORDER — ACETAMINOPHEN 325 MG PO TABS
650.0000 mg | ORAL_TABLET | Freq: Once | ORAL | Status: AC
  Administered 2015-02-06: 650 mg via ORAL

## 2015-02-06 MED ORDER — TRASTUZUMAB CHEMO INJECTION 440 MG
6.0000 mg/kg | Freq: Once | INTRAVENOUS | Status: AC
  Administered 2015-02-06: 315 mg via INTRAVENOUS
  Filled 2015-02-06: qty 15

## 2015-02-06 MED ORDER — SODIUM CHLORIDE 0.9 % IJ SOLN
10.0000 mL | INTRAMUSCULAR | Status: DC | PRN
  Administered 2015-02-06: 10 mL
  Filled 2015-02-06: qty 10

## 2015-02-06 MED ORDER — SODIUM CHLORIDE 0.9 % IV SOLN
420.0000 mg | Freq: Once | INTRAVENOUS | Status: AC
  Administered 2015-02-06: 420 mg via INTRAVENOUS
  Filled 2015-02-06: qty 14

## 2015-02-06 MED ORDER — SODIUM CHLORIDE 0.9 % IV SOLN
Freq: Once | INTRAVENOUS | Status: AC
  Administered 2015-02-06: 11:00:00 via INTRAVENOUS

## 2015-02-06 MED ORDER — DIPHENHYDRAMINE HCL 25 MG PO CAPS
50.0000 mg | ORAL_CAPSULE | Freq: Once | ORAL | Status: AC
  Administered 2015-02-06: 50 mg via ORAL

## 2015-02-06 MED ORDER — DIPHENHYDRAMINE HCL 25 MG PO CAPS
ORAL_CAPSULE | ORAL | Status: AC
  Filled 2015-02-06: qty 2

## 2015-02-06 MED ORDER — ACETAMINOPHEN 325 MG PO TABS
ORAL_TABLET | ORAL | Status: AC
  Filled 2015-02-06: qty 2

## 2015-02-06 MED ORDER — SODIUM CHLORIDE 0.9 % IV SOLN
Freq: Once | INTRAVENOUS | Status: AC
  Administered 2015-02-06: 11:00:00 via INTRAVENOUS
  Filled 2015-02-06: qty 5

## 2015-02-06 NOTE — Progress Notes (Signed)
Patient Care Team: Olga Millers, MD as PCP - General (Obstetrics and Gynecology)  DIAGNOSIS: No matching staging information was found for the patient.  SUMMARY OF ONCOLOGIC HISTORY:   Cancer of central portion of right female breast   11/14/2014 Initial Diagnosis Right breast biopsy 8:30 position: Invasive ductal carcinoma with DCIS, grade 2, ER 0%, PR 0%, HER-2 positive ratio 3.53   11/20/2014 Breast MRI Right breast: 3.7 x 2.8 x 1.7 cm area of non-mass enhancement and retroareolar right breast into the lower outer quadrant and lower inner quadrant extending into the nipple involving anterior two thirds of the breast, no lymph nodes   12/04/2014 -  Neo-Adjuvant Chemotherapy Taxotere, carboplatin, Herceptin, Perjeta 6 cycles followed by Herceptin maintenance for 1 year   12/19/2014 Procedure PALB2 Mutation    CHIEF COMPLIANT: Cycle for TCH Perjeta, improvement in sinus infection, nausea and vomiting  INTERVAL HISTORY: Robin Miles is a 39 year old with above-mentioned history of right-sided breast cancer currently on new adjuvant chemotherapy with [facility]CH Perjeta. She had intractable nausea and vomiting and we added Emend with cycle 3. She had done slightly better but still had profound nausea and vomiting for 1 day. When she gets Aloxi she has marked improvement in her symptoms. After this cycle she plans to go to the beach for a week. She is still working 12 hour shifts at N[employer]. She feels very tired right after chemotherapy. She is complaining of tenderness in the breast bilaterally.  REVIEW OF SYSTEMS:   Constitutional: Denies fevers, chills or abnormal weight loss Eyes: Denies blurriness of vision Ears, nose, mouth, throat, and face: Denies mucositis or sore throat Respiratory: Denies cough, dyspnea or wheezes Cardiovascular: Denies palpitation, chest discomfort or lower extremity swelling Gastrointestinal:  Denies nausea, heartburn or change in bowel habits Skin: Denies abnormal skin  rashes Lymphatics: Denies new lymphadenopathy or easy bruising Neurological:Denies numbness, tingling or new weaknesses Behavioral/Psych: Mood is stable, no new changes  All other systems were reviewed with the patient and are negative.  I have reviewed the past medical history, past surgical history, social history and family history with the patient and they are unchanged from previous note.  ALLERGIES:  has No Known Allergies.  MEDICATIONS:  Current Outpatient Prescriptions  Medication Sig Dispense Refill  . Alum & Mag Hydroxide-Simeth (MAGIC MOUTHWASH W/LIDOCAINE) SOLN 5-56m  Four times daily as needed for mouth discomfort 240 mL 1  . dexamethasone (DECADRON) 4 MG tablet Take 1 tablet (4 mg total) by mouth 2 (two) times daily. Start the day before Taxotere. Then again the day after chemo for 3 days. 30 tablet 2  . diphenoxylate-atropine (LOMOTIL) 2.5-0.025 MG per tablet Take 1 tablet by mouth 4 (four) times daily as needed for diarrhea or loose stools. 30 tablet 3  . lidocaine-prilocaine (EMLA) cream Apply to affected area once 30 g 3  . loratadine (CLARITIN) 10 MG tablet Take 10 mg by mouth daily.    .Marland KitchenLORazepam (ATIVAN) 0.5 MG tablet Take 1 tablet (0.5 mg total) by mouth at bedtime. 30 tablet 2  . Omeprazole Magnesium (PRILOSEC OTC PO) Take 1 tablet by mouth daily.    . ondansetron (ZOFRAN) 8 MG tablet Take 1 tablet (8 mg total) by mouth 2 (two) times daily. Start the day after chemo for 3 days. Then take as needed for nausea or vomiting. 30 tablet 1  . pegfilgrastim (NEULASTA) 6 MG/0.6ML injection Inject 6 mg into the skin once.    .Marland KitchenPRESCRIPTION MEDICATION Chemo--[facility].    .Marland Kitchen  Probiotic Product (PROBIOTIC PO) Take 1 capsule by mouth daily.    . prochlorperazine (COMPAZINE) 10 MG tablet Take 1 tablet (10 mg total) by mouth every 6 (six) hours as needed (Nausea or vomiting). 30 tablet 1  . promethazine (PHENERGAN) 25 MG suppository Place 1 suppository (25 mg total) rectally every 6 (six)  hours as needed for nausea or vomiting. 12 each 0  . promethazine (PHENERGAN) 25 MG tablet Take 1 tablet (25 mg total) by mouth every 6 (six) hours as needed for nausea or vomiting. 30 tablet 0  . UNABLE TO FIND 1 each by Other route once. Dispense cranial prosthesis per medical necessity for alopecia due to chemotherapy secondary to known breast cancer diagnosis. 1 each 0  . ALPRAZolam (XANAX) 0.5 MG tablet Take 0.5 mg by mouth daily as needed for anxiety.     Marland Kitchen HYDROcodone-acetaminophen (NORCO/VICODIN) 5-325 MG per tablet Take 1-2 tablets by mouth every 4 (four) hours as needed for moderate pain or severe pain. (Patient not taking: Reported on 01/23/2015) 25 tablet 0   No current facility-administered medications for this visit.    PHYSICAL EXAMINATION: ECOG PERFORMANCE STATUS: 1 - Symptomatic but completely ambulatory  Filed Vitals:   02/06/15 0835  BP: 102/59  Pulse: 87  Temp: 98.1 F (36.7 C)  Resp: 18   Filed Weights   02/06/15 0835  Weight: 113 lb 4.8 oz (51.393 kg)    GENERAL:alert, no distress and comfortable SKIN: skin color, texture, turgor are normal, no rashes or significant lesions EYES: normal, Conjunctiva are pink and non-injected, sclera clear OROPHARYNX:no exudate, no erythema and lips, buccal mucosa, and tongue normal  NECK: supple, thyroid normal size, non-tender, without nodularity LYMPH:  no palpable lymphadenopathy in the cervical, axillary or inguinal LUNGS: clear to auscultation and percussion with normal breathing effort HEART: regular rate & rhythm and no murmurs and no lower extremity edema ABDOMEN:abdomen soft, non-tender and normal bowel sounds Musculoskeletal:no cyanosis of digits and no clubbing  NEURO: alert & oriented x 3 with fluent speech, no focal motor/sensory deficits BREAST: No palpable masses or nodules in either right or left breasts. No palpable axillary supraclavicular or infraclavicular adenopathy no breast tenderness or nipple discharge.  (exam performed in the presence of a chaperone)  LABORATORY DATA:  I have reviewed the data as listed   Chemistry      Component Value Date/Time   NA 140 01/25/2015 1342   NA 136 01/02/2015 2217   K 3.7 01/25/2015 1342   K 3.8 01/02/2015 2217   CL 101 01/02/2015 2217   CO2 25 01/25/2015 1342   CO2 23 01/02/2015 2217   BUN 9.9 01/25/2015 1342   BUN 12 01/02/2015 2217   CREATININE 0.9 01/25/2015 1342   CREATININE 0.81 01/02/2015 2217      Component Value Date/Time   CALCIUM 10.2 01/25/2015 1342   CALCIUM 9.3 01/02/2015 2217   ALKPHOS 153* 01/25/2015 1342   ALKPHOS 87 01/02/2015 2217   AST 33 01/25/2015 1342   AST 16 01/02/2015 2217   ALT 54 01/25/2015 1342   ALT 19 01/02/2015 2217   BILITOT 0.41 01/25/2015 1342   BILITOT 1.5* 01/02/2015 2217       Lab Results  Component Value Date   WBC 7.9 02/06/2015   HGB 9.8* 02/06/2015   HCT 28.7* 02/06/2015   MCV 92.6 02/06/2015   PLT 136* 02/06/2015   NEUTROABS 6.4 02/06/2015   ASSESSMENT & PLAN:  Cancer of central portion of right female breast Right breast  retroareolar mass 3.7 x 2.8 x 1.7 cm involving anterior two thirds of the breast, T2 N0 M0 stage II a clinical stage, ER 0%, appears 0%, HER-2 positive ratio 3.53  Current Treatment: TCHP today is cycle 4 day 1 Chemo monitoring: 1. Diarrhea: I added Lomotil to Imodium 2. Fatigue over the weekend 3. Nausea during chemotherapy improved with Aloxi but patient had delayed nausea and vomiting. Added Emend to cycle 3. In spite of this patient had nausea and vomiting for 1 day. We will add Aloxi on day 8 and day 10. We will also be adding oral dexamethasone 1 mg daily 4. Bone pain from neulasta: recommended claritin and NSAIDs PRN 5. 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 6. Depression: started on venlafaxine 37.4m daily 6. Abnormal bilirubin: Bilirubin has been up and down 7. Sinusitis:  treated with Z-pak 8. Thrombocytopenia: Platelets are 136 and we will continue to watch and monitor them.   Monitoring closely for chemotherapy side effects ECHO  11/29/2014: 55-60% EF We will get another echocardiogram in August.  Return to clinic in 1 week for fluids and Aloxi Aloxi added to day 8 and day 10 And in 3 weeks for follow-up with me in the clinic   No orders of the defined types were placed in this encounter.   The patient has a good understanding of the overall plan. she agrees with it. she will call with any problems that may develop before the next visit here.   GRulon Eisenmenger MD

## 2015-02-06 NOTE — Progress Notes (Signed)
Spiritual Care Note  Provided pastoral presence, listening, and encouragement to Zanasia and her husband in infusion room.  She reports that she is feeling better now than earlier in chemo series and used opportunity to share and reflect on how her six children are coping with her illness and treatment.  Following, and family aware of ongoing chaplain availability, but please also page as needs arise.  Thank you.  [place], [place] Pager:  [phone] Voicemail:  [phone]

## 2015-02-06 NOTE — Patient Instructions (Signed)
[facility] Discharge Instructions for Patients Receiving Chemotherapy  Today you received the following chemotherapy agents: Herceptin, Perjeta, Taxotere, Carboplatin.  To help prevent nausea and vomiting after your treatment, we encourage you to take your nausea medication.   If you develop nausea and vomiting that is not controlled by your nausea medication, call the clinic.   BELOW ARE SYMPTOMS THAT SHOULD BE REPORTED IMMEDIATELY:  *FEVER GREATER THAN 100.5 F  *CHILLS WITH OR WITHOUT FEVER  NAUSEA AND VOMITING THAT IS NOT CONTROLLED WITH YOUR NAUSEA MEDICATION  *UNUSUAL SHORTNESS OF BREATH  *UNUSUAL BRUISING OR BLEEDING  TENDERNESS IN MOUTH AND THROAT WITH OR WITHOUT PRESENCE OF ULCERS  *URINARY PROBLEMS  *BOWEL PROBLEMS  UNUSUAL RASH Items with * indicate a potential emergency and should be followed up as soon as possible.  Feel free to call the clinic you have any questions or concerns. The clinic phone number is [phone].  Please show the [place] at check-in to the Emergency Department and triage nurse.

## 2015-02-06 NOTE — Telephone Encounter (Signed)
Gave avs & calendar for August. Sent message to schedule treatment for July.

## 2015-02-07 ENCOUNTER — Other Ambulatory Visit: Payer: Self-pay | Admitting: *Deleted

## 2015-02-07 ENCOUNTER — Telehealth: Payer: Self-pay | Admitting: *Deleted

## 2015-02-07 DIAGNOSIS — C50111 Malignant neoplasm of central portion of right female breast: Secondary | ICD-10-CM

## 2015-02-07 NOTE — Telephone Encounter (Signed)
Per staff message and POF I have scheduled appts. Advised scheduler of appts. JMW  

## 2015-02-13 ENCOUNTER — Ambulatory Visit (HOSPITAL_BASED_OUTPATIENT_CLINIC_OR_DEPARTMENT_OTHER): Payer: BLUE CROSS/BLUE SHIELD

## 2015-02-13 ENCOUNTER — Other Ambulatory Visit: Payer: Self-pay

## 2015-02-13 VITALS — BP 107/58 | HR 61 | Temp 98.4°F

## 2015-02-13 DIAGNOSIS — Z5189 Encounter for other specified aftercare: Secondary | ICD-10-CM | POA: Diagnosis not present

## 2015-02-13 DIAGNOSIS — C50111 Malignant neoplasm of central portion of right female breast: Secondary | ICD-10-CM

## 2015-02-13 MED ORDER — PALONOSETRON HCL INJECTION 0.25 MG/5ML
0.2500 mg | Freq: Once | INTRAVENOUS | Status: AC
  Administered 2015-02-13: 0.25 mg via INTRAVENOUS

## 2015-02-13 MED ORDER — SODIUM CHLORIDE 0.9 % IV SOLN
Freq: Once | INTRAVENOUS | Status: AC
  Administered 2015-02-13: 09:00:00 via INTRAVENOUS

## 2015-02-13 MED ORDER — HEPARIN SOD (PORK) LOCK FLUSH 100 UNIT/ML IV SOLN
500.0000 [IU] | Freq: Once | INTRAVENOUS | Status: AC
  Administered 2015-02-13: 500 [IU] via INTRAVENOUS
  Filled 2015-02-13: qty 5

## 2015-02-13 MED ORDER — SODIUM CHLORIDE 0.9 % IJ SOLN
10.0000 mL | INTRAMUSCULAR | Status: DC | PRN
  Administered 2015-02-13: 10 mL via INTRAVENOUS
  Filled 2015-02-13: qty 10

## 2015-02-13 MED ORDER — PALONOSETRON HCL INJECTION 0.25 MG/5ML
INTRAVENOUS | Status: AC
  Filled 2015-02-13: qty 5

## 2015-02-13 NOTE — Patient Instructions (Signed)
Palonosetron Injection What is this medicine? PALONOSETRON (pal oh NOE se tron) is used to prevent nausea and vomiting caused by chemotherapy. It also helps prevent delayed nausea and vomiting that may occur a few days after your treatment. This medicine may be used for other purposes; ask your health care provider or pharmacist if you have questions. COMMON BRAND NAME(S): Aloxi What should I tell my health care provider before I take this medicine? They need to know if you have any of these conditions: -an unusual or allergic reaction to palonosetron, dolasetron, granisetron, ondansetron, other medicines, foods, dyes, or preservatives -pregnant or trying to get pregnant -breast-feeding How should I use this medicine? This medicine is for infusion into a vein. It is given by a health care professional in a hospital or clinic setting. Talk to your pediatrician regarding the use of this medicine in children. While this drug may be prescribed for children as young as 1 month for selected conditions, precautions do apply. Overdosage: If you think you have taken too much of this medicine contact a poison control center or emergency room at once. NOTE: This medicine is only for you. Do not share this medicine with others. What if I miss a dose? This does not apply. What may interact with this medicine? -certain medicines for depression, anxiety, or psychotic disturbances -fentanyl -linezolid -MAOIs like Carbex, Eldepryl, Marplan, Nardil, and Parnate -methylene blue (injected into a vein) -tramadol This list may not describe all possible interactions. Give your health care provider a list of all the medicines, herbs, non-prescription drugs, or dietary supplements you use. Also tell them if you smoke, drink alcohol, or use illegal drugs. Some items may interact with your medicine. What should I watch for while using this medicine? Your condition will be monitored carefully while you are receiving  this medicine. What side effects may I notice from receiving this medicine? Side effects that you should report to your doctor or health care professional as soon as possible: -allergic reactions like skin rash, itching or hives, swelling of the face, lips, or tongue -breathing problems -confusion -dizziness -fast, irregular heartbeat -fever and chills -loss of balance or coordination -seizures -sweating -swelling of the hands and feet -tremors -unusually weak or tired Side effects that usually do not require medical attention (report to your doctor or health care professional if they continue or are bothersome): -constipation or diarrhea -headache This list may not describe all possible side effects. Call your doctor for medical advice about side effects. You may report side effects to FDA at 1-800-FDA-1088. Where should I keep my medicine? This drug is given in a hospital or clinic and will not be stored at home. NOTE: This sheet is a summary. It may not cover all possible information. If you have questions about this medicine, talk to your doctor, pharmacist, or health care provider.  2015, Elsevier/Gold Standard. (2013-05-12 10:38:36)  Dehydration, Adult Dehydration is when you lose more fluids from the body than you take in. Vital organs like the kidneys, brain, and heart cannot function without a proper amount of fluids and salt. Any loss of fluids from the body can cause dehydration.  CAUSES   Vomiting.  Diarrhea.  Excessive sweating.  Excessive urine output.  Fever. SYMPTOMS  Mild dehydration  Thirst.  Dry lips.  Slightly dry mouth. Moderate dehydration  Very dry mouth.  Sunken eyes.  Skin does not bounce back quickly when lightly pinched and released.  Dark urine and decreased urine production.  Decreased tear production.  Headache. Severe dehydration  Very dry mouth.  Extreme thirst.  Rapid, weak pulse (more than 100 beats per minute at  rest).  Cold hands and feet.  Not able to sweat in spite of heat and temperature.  Rapid breathing.  Blue lips.  Confusion and lethargy.  Difficulty being awakened.  Minimal urine production.  No tears. DIAGNOSIS  Your caregiver will diagnose dehydration based on your symptoms and your exam. Blood and urine tests will help confirm the diagnosis. The diagnostic evaluation should also identify the cause of dehydration. TREATMENT  Treatment of mild or moderate dehydration can often be done at home by increasing the amount of fluids that you drink. It is best to drink small amounts of fluid more often. Drinking too much at one time can make vomiting worse. Refer to the home care instructions below. Severe dehydration needs to be treated at the hospital where you will probably be given intravenous (IV) fluids that contain water and electrolytes. HOME CARE INSTRUCTIONS   Ask your caregiver about specific rehydration instructions.  Drink enough fluids to keep your urine clear or pale yellow.  Drink small amounts frequently if you have nausea and vomiting.  Eat as you normally do.  Avoid:  Foods or drinks high in sugar.  Carbonated drinks.  Juice.  Extremely hot or cold fluids.  Drinks with caffeine.  Fatty, greasy foods.  Alcohol.  Tobacco.  Overeating.  Gelatin desserts.  Wash your hands well to avoid spreading bacteria and viruses.  Only take over-the-counter or prescription medicines for pain, discomfort, or fever as directed by your caregiver.  Ask your caregiver if you should continue all prescribed and over-the-counter medicines.  Keep all follow-up appointments with your caregiver. SEEK MEDICAL CARE IF:  You have abdominal pain and it increases or stays in one area (localizes).  You have a rash, stiff neck, or severe headache.  You are irritable, sleepy, or difficult to awaken.  You are weak, dizzy, or extremely thirsty. SEEK IMMEDIATE MEDICAL  CARE IF:   You are unable to keep fluids down or you get worse despite treatment.  You have frequent episodes of vomiting or diarrhea.  You have blood or green matter (bile) in your vomit.  You have blood in your stool or your stool looks black and tarry.  You have not urinated in 6 to 8 hours, or you have only urinated a small amount of very dark urine.  You have a fever.  You faint. MAKE SURE YOU:   Understand these instructions.  Will watch your condition.  Will get help right away if you are not doing well or get worse. Document Released: 07/06/2005 Document Revised: 09/28/2011 Document Reviewed: 02/23/2011 [facility] Patient Information 2015 [place], [place]. This information is not intended to replace advice given to you by your health care provider. Make sure you discuss any questions you have with your health care provider.

## 2015-02-14 ENCOUNTER — Ambulatory Visit: Payer: BLUE CROSS/BLUE SHIELD

## 2015-02-14 ENCOUNTER — Other Ambulatory Visit: Payer: Self-pay

## 2015-02-14 ENCOUNTER — Other Ambulatory Visit: Payer: Self-pay | Admitting: *Deleted

## 2015-02-14 DIAGNOSIS — C50111 Malignant neoplasm of central portion of right female breast: Secondary | ICD-10-CM

## 2015-02-14 MED ORDER — LORAZEPAM 0.5 MG PO TABS: 0.5000 mg | ORAL_TABLET | Freq: Four times a day (QID) | ORAL | Status: DC | PRN

## 2015-02-14 NOTE — Progress Notes (Unsigned)
Per pt - she needs ativan refill.  Already called in by [place].  Clarified supportive orders with Dr. Lindi Adie.

## 2015-02-15 ENCOUNTER — Ambulatory Visit (HOSPITAL_BASED_OUTPATIENT_CLINIC_OR_DEPARTMENT_OTHER): Payer: BLUE CROSS/BLUE SHIELD

## 2015-02-15 VITALS — BP 97/41 | HR 79 | Temp 98.2°F

## 2015-02-15 DIAGNOSIS — Z1509 Genetic susceptibility to other malignant neoplasm: Secondary | ICD-10-CM

## 2015-02-15 DIAGNOSIS — F419 Anxiety disorder, unspecified: Secondary | ICD-10-CM

## 2015-02-15 DIAGNOSIS — Z1589 Genetic susceptibility to other disease: Secondary | ICD-10-CM

## 2015-02-15 DIAGNOSIS — C50111 Malignant neoplasm of central portion of right female breast: Secondary | ICD-10-CM | POA: Diagnosis not present

## 2015-02-15 DIAGNOSIS — Z1379 Encounter for other screening for genetic and chromosomal anomalies: Secondary | ICD-10-CM

## 2015-02-15 DIAGNOSIS — Z5189 Encounter for other specified aftercare: Secondary | ICD-10-CM

## 2015-02-15 DIAGNOSIS — R197 Diarrhea, unspecified: Secondary | ICD-10-CM

## 2015-02-15 DIAGNOSIS — E860 Dehydration: Secondary | ICD-10-CM

## 2015-02-15 DIAGNOSIS — C50919 Malignant neoplasm of unspecified site of unspecified female breast: Secondary | ICD-10-CM

## 2015-02-15 DIAGNOSIS — R112 Nausea with vomiting, unspecified: Secondary | ICD-10-CM

## 2015-02-15 DIAGNOSIS — Z803 Family history of malignant neoplasm of breast: Secondary | ICD-10-CM

## 2015-02-15 DIAGNOSIS — Z1502 Genetic susceptibility to malignant neoplasm of ovary: Secondary | ICD-10-CM

## 2015-02-15 MED ORDER — HEPARIN SOD (PORK) LOCK FLUSH 100 UNIT/ML IV SOLN
500.0000 [IU] | Freq: Once | INTRAVENOUS | Status: AC | PRN
  Administered 2015-02-15: 500 [IU]
  Filled 2015-02-15: qty 5

## 2015-02-15 MED ORDER — PALONOSETRON HCL INJECTION 0.25 MG/5ML
0.2500 mg | Freq: Once | INTRAVENOUS | Status: AC
  Administered 2015-02-15: 0.25 mg via INTRAVENOUS

## 2015-02-15 MED ORDER — SODIUM CHLORIDE 0.9 % IJ SOLN
10.0000 mL | INTRAMUSCULAR | Status: DC | PRN
  Administered 2015-02-15: 10 mL
  Filled 2015-02-15: qty 10

## 2015-02-15 MED ORDER — SODIUM CHLORIDE 0.9 % IV SOLN
Freq: Once | INTRAVENOUS | Status: AC
  Administered 2015-02-15: 08:00:00 via INTRAVENOUS

## 2015-02-15 MED ORDER — PALONOSETRON HCL INJECTION 0.25 MG/5ML
INTRAVENOUS | Status: AC
  Filled 2015-02-15: qty 5

## 2015-02-15 NOTE — Patient Instructions (Signed)
Dehydration, Adult Dehydration is when you lose more fluids from the body than you take in. Vital organs like the kidneys, brain, and heart cannot function without a proper amount of fluids and salt. Any loss of fluids from the body can cause dehydration.  CAUSES   Vomiting.  Diarrhea.  Excessive sweating.  Excessive urine output.  Fever. SYMPTOMS  Mild dehydration  Thirst.  Dry lips.  Slightly dry mouth. Moderate dehydration  Very dry mouth.  Sunken eyes.  Skin does not bounce back quickly when lightly pinched and released.  Dark urine and decreased urine production.  Decreased tear production.  Headache. Severe dehydration  Very dry mouth.  Extreme thirst.  Rapid, weak pulse (more than 100 beats per minute at rest).  Cold hands and feet.  Not able to sweat in spite of heat and temperature.  Rapid breathing.  Blue lips.  Confusion and lethargy.  Difficulty being awakened.  Minimal urine production.  No tears. DIAGNOSIS  Your caregiver will diagnose dehydration based on your symptoms and your exam. Blood and urine tests will help confirm the diagnosis. The diagnostic evaluation should also identify the cause of dehydration. TREATMENT  Treatment of mild or moderate dehydration can often be done at home by increasing the amount of fluids that you drink. It is best to drink small amounts of fluid more often. Drinking too much at one time can make vomiting worse. Refer to the home care instructions below. Severe dehydration needs to be treated at the hospital where you will probably be given intravenous (IV) fluids that contain water and electrolytes. HOME CARE INSTRUCTIONS   Ask your caregiver about specific rehydration instructions.  Drink enough fluids to keep your urine clear or pale yellow.  Drink small amounts frequently if you have nausea and vomiting.  Eat as you normally do.  Avoid:  Foods or drinks high in sugar.  Carbonated  drinks.  Juice.  Extremely hot or cold fluids.  Drinks with caffeine.  Fatty, greasy foods.  Alcohol.  Tobacco.  Overeating.  Gelatin desserts.  Wash your hands well to avoid spreading bacteria and viruses.  Only take over-the-counter or prescription medicines for pain, discomfort, or fever as directed by your caregiver.  Ask your caregiver if you should continue all prescribed and over-the-counter medicines.  Keep all follow-up appointments with your caregiver. SEEK MEDICAL CARE IF:  You have abdominal pain and it increases or stays in one area (localizes).  You have a rash, stiff neck, or severe headache.  You are irritable, sleepy, or difficult to awaken.  You are weak, dizzy, or extremely thirsty. SEEK IMMEDIATE MEDICAL CARE IF:   You are unable to keep fluids down or you get worse despite treatment.  You have frequent episodes of vomiting or diarrhea.  You have blood or green matter (bile) in your vomit.  You have blood in your stool or your stool looks black and tarry.  You have not urinated in 6 to 8 hours, or you have only urinated a small amount of very dark urine.  You have a fever.  You faint. MAKE SURE YOU:   Understand these instructions.  Will watch your condition.  Will get help right away if you are not doing well or get worse. Document Released: 07/06/2005 Document Revised: 09/28/2011 Document Reviewed: 02/23/2011 Exit[employer] Patient Information 2015 [employer]. This information is not intended to replace advice given to you by your health care provider. Make sure you discuss any questions you have with your health care   provider.  

## 2015-02-15 NOTE — Progress Notes (Signed)
Pt states her nausea is much less since Wednsday's IVF and Aloxi. Had diarrhea x3 yesterday but took Imodium with relief of symptoms. No diarrhea today.  Overall pt states she feels much better.

## 2015-02-19 ENCOUNTER — Telehealth: Payer: Self-pay

## 2015-02-19 NOTE — Telephone Encounter (Signed)
Returned pt call. She did have a mildy stuffy runny nose on Friday and Saturday. She did have a mild sore throat on Saturday. She does not have these symptoms now. [place] if you feel she needs labs she is able to go to a walk in clinic. She says she is staying under an umbrella when she at [employer].

## 2015-02-19 NOTE — Telephone Encounter (Signed)
Pt called stating she has had a low grade temp of 99.2 to 100.5 since Friday. Saturday and Sunday the temp was in the afternoon. No temp on Monday but again today at 100.5. No other symptoms involved.

## 2015-02-22 ENCOUNTER — Telehealth: Payer: Self-pay | Admitting: Hematology and Oncology

## 2015-02-22 NOTE — Telephone Encounter (Signed)
Left message to confirm appointment for echo 08/12

## 2015-02-26 NOTE — Assessment & Plan Note (Signed)
Right breast retroareolar mass 3.7 x 2.8 x 1.7 cm involving anterior two thirds of the breast, T2 N0 M0 stage II a clinical stage, ER 0%, appears 0%, HER-2 positive ratio 3.53  Current Treatment: TCHP today is cycle 5 day 1 Chemo monitoring: 1. Diarrhea: I added Lomotil to Imodium 2. Fatigue over the weekend 3. Nausea during chemotherapy improved with Aloxi but patient had delayed nausea and vomiting. Added Emend to cycle 3. In spite of this patient had nausea and vomiting for 1 day. We will add Aloxi on day 8 and day 10. We will also be adding oral dexamethasone 1 mg daily 4. Bone pain from neulasta: recommended claritin and NSAIDs PRN 5. 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 6. Depression: started on venlafaxine 37.63m daily 6. Abnormal bilirubin: Bilirubin has been up and down 7. Sinusitis: treated with Z-pak 8. Thrombocytopenia: Platelets are 136 and we will continue to watch and monitor them.   Monitoring closely for chemotherapy side effects ECHO 11/29/2014: 55-60% EF We will get another echocardiogram in August.  Return to clinic in 1 week for fluids and Aloxi Aloxi added to day 8 and day 10 And in 3 weeks for follow-up with me in the clinic

## 2015-02-27 ENCOUNTER — Encounter: Payer: Self-pay | Admitting: Hematology and Oncology

## 2015-02-27 ENCOUNTER — Other Ambulatory Visit (HOSPITAL_BASED_OUTPATIENT_CLINIC_OR_DEPARTMENT_OTHER): Payer: BLUE CROSS/BLUE SHIELD

## 2015-02-27 ENCOUNTER — Ambulatory Visit (HOSPITAL_BASED_OUTPATIENT_CLINIC_OR_DEPARTMENT_OTHER): Payer: BLUE CROSS/BLUE SHIELD | Admitting: Hematology and Oncology

## 2015-02-27 ENCOUNTER — Other Ambulatory Visit: Payer: Self-pay | Admitting: Hematology and Oncology

## 2015-02-27 ENCOUNTER — Encounter: Payer: Self-pay | Admitting: *Deleted

## 2015-02-27 ENCOUNTER — Ambulatory Visit (HOSPITAL_BASED_OUTPATIENT_CLINIC_OR_DEPARTMENT_OTHER): Payer: BLUE CROSS/BLUE SHIELD

## 2015-02-27 ENCOUNTER — Telehealth: Payer: Self-pay | Admitting: Hematology and Oncology

## 2015-02-27 VITALS — BP 110/75 | HR 78 | Temp 98.2°F | Resp 18 | Ht 62.5 in | Wt 116.0 lb

## 2015-02-27 DIAGNOSIS — C50411 Malignant neoplasm of upper-outer quadrant of right female breast: Secondary | ICD-10-CM

## 2015-02-27 DIAGNOSIS — C50111 Malignant neoplasm of central portion of right female breast: Secondary | ICD-10-CM | POA: Diagnosis not present

## 2015-02-27 DIAGNOSIS — R110 Nausea: Secondary | ICD-10-CM | POA: Diagnosis not present

## 2015-02-27 DIAGNOSIS — Z5112 Encounter for antineoplastic immunotherapy: Secondary | ICD-10-CM | POA: Diagnosis not present

## 2015-02-27 DIAGNOSIS — Z5189 Encounter for other specified aftercare: Secondary | ICD-10-CM | POA: Diagnosis not present

## 2015-02-27 DIAGNOSIS — Z5111 Encounter for antineoplastic chemotherapy: Secondary | ICD-10-CM | POA: Diagnosis not present

## 2015-02-27 DIAGNOSIS — D6481 Anemia due to antineoplastic chemotherapy: Secondary | ICD-10-CM

## 2015-02-27 DIAGNOSIS — R197 Diarrhea, unspecified: Secondary | ICD-10-CM | POA: Diagnosis not present

## 2015-02-27 LAB — CBC WITH DIFFERENTIAL/PLATELET
BASO%: 0.2 % (ref 0.0–2.0)
BASOS ABS: 0 10*3/uL (ref 0.0–0.1)
EOS%: 0 % (ref 0.0–7.0)
Eosinophils Absolute: 0 10*3/uL (ref 0.0–0.5)
HEMATOCRIT: 24.9 % — AB (ref 34.8–46.6)
HEMOGLOBIN: 8.3 g/dL — AB (ref 11.6–15.9)
LYMPH%: 29.6 % (ref 14.0–49.7)
MCH: 32.6 pg (ref 25.1–34.0)
MCHC: 33.2 g/dL (ref 31.5–36.0)
MCV: 98.3 fL (ref 79.5–101.0)
MONO#: 0.2 10*3/uL (ref 0.1–0.9)
MONO%: 5 % (ref 0.0–14.0)
NEUT#: 2.7 10*3/uL (ref 1.5–6.5)
NEUT%: 65.2 % (ref 38.4–76.8)
PLATELETS: 252 10*3/uL (ref 145–400)
RBC: 2.53 10*6/uL — ABNORMAL LOW (ref 3.70–5.45)
RDW: 23.4 % — ABNORMAL HIGH (ref 11.2–14.5)
WBC: 4.1 10*3/uL (ref 3.9–10.3)
lymph#: 1.2 10*3/uL (ref 0.9–3.3)

## 2015-02-27 LAB — COMPREHENSIVE METABOLIC PANEL (CC13)
ALK PHOS: 70 U/L (ref 40–150)
ALT: 33 U/L (ref 0–55)
AST: 19 U/L (ref 5–34)
Albumin: 3.9 g/dL (ref 3.5–5.0)
Anion Gap: 6 mEq/L (ref 3–11)
BUN: 13.9 mg/dL (ref 7.0–26.0)
CO2: 26 mEq/L (ref 22–29)
Calcium: 9.7 mg/dL (ref 8.4–10.4)
Chloride: 110 mEq/L — ABNORMAL HIGH (ref 98–109)
Creatinine: 0.8 mg/dL (ref 0.6–1.1)
EGFR: >90 mL/min/{1.73_m2} (ref >90)
Glucose: 121 mg/dl (ref 70–140)
POTASSIUM: 4 meq/L (ref 3.5–5.1)
Sodium: 142 mEq/L (ref 136–145)
Total Bilirubin: 0.57 mg/dL (ref 0.20–1.20)
Total Protein: 6.3 g/dL — ABNORMAL LOW (ref 6.4–8.3)

## 2015-02-27 MED ORDER — DEXTROSE 5 % IV SOLN
75.0000 mg/m2 | Freq: Once | INTRAVENOUS | Status: AC
  Administered 2015-02-27: 110 mg via INTRAVENOUS
  Filled 2015-02-27: qty 11

## 2015-02-27 MED ORDER — SODIUM CHLORIDE 0.9 % IV SOLN
Freq: Once | INTRAVENOUS | Status: AC
  Administered 2015-02-27: 13:00:00 via INTRAVENOUS
  Filled 2015-02-27: qty 5

## 2015-02-27 MED ORDER — PALONOSETRON HCL INJECTION 0.25 MG/5ML
INTRAVENOUS | Status: AC
  Filled 2015-02-27: qty 5

## 2015-02-27 MED ORDER — PEGFILGRASTIM 6 MG/0.6ML ~~LOC~~ PSKT
6.0000 mg | PREFILLED_SYRINGE | Freq: Once | SUBCUTANEOUS | Status: AC
  Administered 2015-02-27: 6 mg via SUBCUTANEOUS
  Filled 2015-02-27: qty 0.6

## 2015-02-27 MED ORDER — SODIUM CHLORIDE 0.9 % IV SOLN
Freq: Once | INTRAVENOUS | Status: AC
  Administered 2015-02-27: 11:00:00 via INTRAVENOUS

## 2015-02-27 MED ORDER — PALONOSETRON HCL INJECTION 0.25 MG/5ML
0.2500 mg | Freq: Once | INTRAVENOUS | Status: AC
  Administered 2015-02-27: 0.25 mg via INTRAVENOUS

## 2015-02-27 MED ORDER — HEPARIN SOD (PORK) LOCK FLUSH 100 UNIT/ML IV SOLN
500.0000 [IU] | Freq: Once | INTRAVENOUS | Status: DC | PRN
  Filled 2015-02-27: qty 5

## 2015-02-27 MED ORDER — ACETAMINOPHEN 325 MG PO TABS
650.0000 mg | ORAL_TABLET | Freq: Once | ORAL | Status: AC
Start: 2015-02-27 — End: 2015-02-27
  Administered 2015-02-27: 650 mg via ORAL

## 2015-02-27 MED ORDER — HEPARIN SOD (PORK) LOCK FLUSH 100 UNIT/ML IV SOLN
500.0000 [IU] | Freq: Once | INTRAVENOUS | Status: AC | PRN
  Administered 2015-02-27: 500 [IU]
  Filled 2015-02-27: qty 5

## 2015-02-27 MED ORDER — SODIUM CHLORIDE 0.9 % IJ SOLN
10.0000 mL | INTRAMUSCULAR | Status: DC | PRN
  Filled 2015-02-27: qty 10

## 2015-02-27 MED ORDER — DIPHENHYDRAMINE HCL 25 MG PO CAPS
ORAL_CAPSULE | ORAL | Status: AC
  Filled 2015-02-27: qty 2

## 2015-02-27 MED ORDER — ACETAMINOPHEN 325 MG PO TABS
ORAL_TABLET | ORAL | Status: AC
  Filled 2015-02-27: qty 2

## 2015-02-27 MED ORDER — TRASTUZUMAB CHEMO INJECTION 440 MG
6.0000 mg/kg | Freq: Once | INTRAVENOUS | Status: AC
  Administered 2015-02-27: 315 mg via INTRAVENOUS
  Filled 2015-02-27: qty 15

## 2015-02-27 MED ORDER — SODIUM CHLORIDE 0.9 % IV SOLN: Freq: Once | INTRAVENOUS | Status: DC

## 2015-02-27 MED ORDER — SODIUM CHLORIDE 0.9 % IV SOLN
600.0000 mg | Freq: Once | INTRAVENOUS | Status: AC
  Administered 2015-02-27: 600 mg via INTRAVENOUS
  Filled 2015-02-27: qty 60

## 2015-02-27 MED ORDER — DIPHENHYDRAMINE HCL 25 MG PO CAPS
50.0000 mg | ORAL_CAPSULE | Freq: Once | ORAL | Status: AC
  Administered 2015-02-27: 50 mg via ORAL

## 2015-02-27 MED ORDER — SODIUM CHLORIDE 0.9 % IJ SOLN
10.0000 mL | INTRAMUSCULAR | Status: DC | PRN
  Administered 2015-02-27: 10 mL
  Filled 2015-02-27: qty 10

## 2015-02-27 MED ORDER — SODIUM CHLORIDE 0.9 % IV SOLN
420.0000 mg | Freq: Once | INTRAVENOUS | Status: AC
  Administered 2015-02-27: 420 mg via INTRAVENOUS
  Filled 2015-02-27: qty 14

## 2015-02-27 NOTE — Patient Instructions (Addendum)
[facility] Discharge Instructions for Patients Receiving Chemotherapy  Today you received the following chemotherapy agents:  Taxotere,Carboplatin, Herceptin, Perjeta  To help prevent nausea and vomiting after your treatment, we encourage you to take your nausea medication.   If you develop nausea and vomiting that is not controlled by your nausea medication, call the clinic.   BELOW ARE SYMPTOMS THAT SHOULD BE REPORTED IMMEDIATELY:  *FEVER GREATER THAN 100.5 F  *CHILLS WITH OR WITHOUT FEVER  NAUSEA AND VOMITING THAT IS NOT CONTROLLED WITH YOUR NAUSEA MEDICATION  *UNUSUAL SHORTNESS OF BREATH  *UNUSUAL BRUISING OR BLEEDING  TENDERNESS IN MOUTH AND THROAT WITH OR WITHOUT PRESENCE OF ULCERS  *URINARY PROBLEMS  *BOWEL PROBLEMS  UNUSUAL RASH Items with * indicate a potential emergency and should be followed up as soon as possible.  Feel free to call the clinic you have any questions or concerns. The clinic phone number is [phone].  Please show the [place] at check-in to the Emergency Department and triage nurse.  Do not remove the [place] till @ 7:40 pm tomorrow 02/28/15.

## 2015-02-27 NOTE — Telephone Encounter (Signed)
Gave avs & calendar for August °

## 2015-02-27 NOTE — Progress Notes (Signed)
Patient Care Team: Olga Millers, MD as PCP - General (Obstetrics and Gynecology)  DIAGNOSIS: No matching staging information was found for the patient.  SUMMARY OF ONCOLOGIC HISTORY:   Cancer of central portion of right female breast   11/14/2014 Initial Diagnosis Right breast biopsy 8:30 position: Invasive ductal carcinoma with DCIS, grade 2, ER 0%, PR 0%, HER-2 positive ratio 3.53   11/20/2014 Breast MRI Right breast: 3.7 x 2.8 x 1.7 cm area of non-mass enhancement and retroareolar right breast into the lower outer quadrant and lower inner quadrant extending into the nipple involving anterior two thirds of the breast, no lymph nodes   12/04/2014 -  Neo-Adjuvant Chemotherapy Taxotere, carboplatin, Herceptin, Perjeta 6 cycles followed by Herceptin maintenance for 1 year   12/19/2014 Procedure PALB2 Mutation    CHIEF COMPLIANT: Cycle 5 of TCH Perjeta  INTERVAL HISTORY: Robin [facility] is a 39 year old with above-mentioned history of right breast cancer currently on adjuvant chemotherapy. Today is cycle #5. She had done much better with the last cycle of chemotherapy. She did have fevers of 100.5 for about 3-4 days. She also gets a rash the week before chemotherapy as well. The rash completely resolved. Denies any nausea vomiting. Energy levels have declined.  REVIEW OF SYSTEMS:   Constitutional: Denies fevers, chills or abnormal weight loss Eyes: Denies blurriness of vision Ears, nose, mouth, throat, and face: Denies mucositis or sore throat Respiratory: Denies cough, dyspnea or wheezes Cardiovascular: Denies palpitation, chest discomfort or lower extremity swelling Gastrointestinal:  Denies nausea, heartburn or change in bowel habits Skin: Denies abnormal skin rashes Lymphatics: Denies new lymphadenopathy or easy bruising Neurological:Denies numbness, tingling or new weaknesses Behavioral/Psych: Mood is stable, no new changes  All other systems were reviewed with the patient and are  negative.  I have reviewed the past medical history, past surgical history, social history and family history with the patient and they are unchanged from previous note.  ALLERGIES:  has No Known Allergies.  MEDICATIONS:  Current Outpatient Prescriptions  Medication Sig Dispense Refill  . ALPRAZolam (XANAX) 0.5 MG tablet Take 0.5 mg by mouth daily as needed for anxiety.     . Alum & Mag Hydroxide-Simeth (MAGIC MOUTHWASH W/LIDOCAINE) SOLN 5-109m  Four times daily as needed for mouth discomfort 240 mL 1  . dexamethasone (DECADRON) 4 MG tablet Take 1 tablet (4 mg total) by mouth 2 (two) times daily. Start the day before Taxotere. Then again the day after chemo for 3 days. 30 tablet 2  . diphenoxylate-atropine (LOMOTIL) 2.5-0.025 MG per tablet Take 1 tablet by mouth 4 (four) times daily as needed for diarrhea or loose stools. 30 tablet 3  . HYDROcodone-acetaminophen (NORCO/VICODIN) 5-325 MG per tablet Take 1-2 tablets by mouth every 4 (four) hours as needed for moderate pain or severe pain. 25 tablet 0  . lidocaine-prilocaine (EMLA) cream Apply to affected area once 30 g 3  . loratadine (CLARITIN) 10 MG tablet Take 10 mg by mouth daily.    .Marland KitchenLORazepam (ATIVAN) 0.5 MG tablet Take 1 tablet (0.5 mg total) by mouth every 6 (six) hours as needed for anxiety or sleep. 30 tablet 2  . Omeprazole Magnesium (PRILOSEC OTC PO) Take 1 tablet by mouth daily.    . ondansetron (ZOFRAN) 8 MG tablet Take 1 tablet (8 mg total) by mouth 2 (two) times daily. Start the day after chemo for 3 days. Then take as needed for nausea or vomiting. 30 tablet 1  . pegfilgrastim (NEULASTA) 6 MG/0.6ML injection  Inject 6 mg into the skin once.    Marland Kitchen PRESCRIPTION MEDICATION Chemo--[facility].    . Probiotic Product (PROBIOTIC PO) Take 1 capsule by mouth daily.    . prochlorperazine (COMPAZINE) 10 MG tablet Take 1 tablet (10 mg total) by mouth every 6 (six) hours as needed (Nausea or vomiting). 30 tablet 1  . promethazine (PHENERGAN) 25 MG  suppository Place 1 suppository (25 mg total) rectally every 6 (six) hours as needed for nausea or vomiting. 12 each 0  . promethazine (PHENERGAN) 25 MG tablet Take 1 tablet (25 mg total) by mouth every 6 (six) hours as needed for nausea or vomiting. 30 tablet 0  . UNABLE TO FIND 1 each by Other route once. Dispense cranial prosthesis per medical necessity for alopecia due to chemotherapy secondary to known breast cancer diagnosis. 1 each 0   No current facility-administered medications for this visit.    PHYSICAL EXAMINATION: ECOG PERFORMANCE STATUS: 1 - Symptomatic but completely ambulatory  Filed Vitals:   02/27/15 0907  BP: 110/75  Pulse: 78  Temp: 98.2 F (36.8 C)  Resp: 18   Filed Weights   02/27/15 0907  Weight: 116 lb (52.617 kg)    GENERAL:alert, no distress and comfortable SKIN: skin color, texture, turgor are normal, no rashes or significant lesions EYES: normal, Conjunctiva are pink and non-injected, sclera clear OROPHARYNX:no exudate, no erythema and lips, buccal mucosa, and tongue normal  NECK: supple, thyroid normal size, non-tender, without nodularity LYMPH:  no palpable lymphadenopathy in the cervical, axillary or inguinal LUNGS: clear to auscultation and percussion with normal breathing effort HEART: regular rate & rhythm and no murmurs and no lower extremity edema ABDOMEN:abdomen soft, non-tender and normal bowel sounds Musculoskeletal:no cyanosis of digits and no clubbing  NEURO: alert & oriented x 3 with fluent speech, no focal motor/sensory deficits  LABORATORY DATA:  I have reviewed the data as listed   Chemistry      Component Value Date/Time   NA 142 02/27/2015 0854   NA 136 01/02/2015 2217   K 4.0 02/27/2015 0854   K 3.8 01/02/2015 2217   CL 101 01/02/2015 2217   CO2 26 02/27/2015 0854   CO2 23 01/02/2015 2217   BUN 13.9 02/27/2015 0854   BUN 12 01/02/2015 2217   CREATININE 0.8 02/27/2015 0854   CREATININE 0.81 01/02/2015 2217       Component Value Date/Time   CALCIUM 9.7 02/27/2015 0854   CALCIUM 9.3 01/02/2015 2217   ALKPHOS 70 02/27/2015 0854   ALKPHOS 87 01/02/2015 2217   AST 19 02/27/2015 0854   AST 16 01/02/2015 2217   ALT 33 02/27/2015 0854   ALT 19 01/02/2015 2217   BILITOT 0.57 02/27/2015 0854   BILITOT 1.5* 01/02/2015 2217       Lab Results  Component Value Date   WBC 4.1 02/27/2015   HGB 8.3* 02/27/2015   HCT 24.9* 02/27/2015   MCV 98.3 02/27/2015   PLT 252 02/27/2015   NEUTROABS 2.7 02/27/2015   ASSESSMENT & PLAN:  Cancer of central portion of right female breast Right breast retroareolar mass 3.7 x 2.8 x 1.7 cm involving anterior two thirds of the breast, T2 N0 M0 stage II a clinical stage, ER 0%, appears 0%, HER-2 positive ratio 3.53  Current Treatment: TCHP today is cycle 5 day 1 Chemo monitoring: 1. Diarrhea: I added Lomotil to Imodium 2. Fatigue over the weekend 3. Nausea during chemotherapy improved with Aloxi but patient had delayed nausea and vomiting. Added Emend  to cycle 3. In spite of this patient had nausea and vomiting for 1 day. We will add Aloxi on day 8 and day 10. We will also be adding oral dexamethasone 1 mg daily 4. Bone pain from neulasta: recommended claritin and NSAIDs PRN 5. 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 6. Depression: started on venlafaxine 37.65m daily 6. Abnormal bilirubin: Bilirubin has been up and down 7. Sinusitis: treated with Z-pak 8. Thrombocytopenia: Platelets improved.  9. Chemotherapy-induced anemia: Hemoglobin declining to 8.3 today. If hemoglobin drops below 8 we will transfuse 2 units of packed red cells. We will recheck her hemoglobin on 03/06/2015.   Monitoring closely for chemotherapy side effects ECHO 11/29/2014: 55-60% EF We will get another echocardiogram in August.  Return to clinic in 1 week for fluids and Aloxi Aloxi added to day 8 and day  10 And in 3 weeks for follow-up with me in the clinic We will set her up for breast MRI and follow-ups with Dr. HExcell Seltzerand Dr. TLeland Johnswith plastic surgery.  Orders Placed This Encounter  Procedures  . MR Breast Bilateral W Wo Contrast    Standing Status: Future     Number of Occurrences:      Standing Expiration Date: 04/28/2016    Order Specific Question:  Reason for Exam (SYMPTOM  OR DIAGNOSIS REQUIRED)    Answer:  Post neoadjuvant chemo    Order Specific Question:  Preferred imaging location?    Answer:  [id number]    Order Specific Question:  Does the patient have a pacemaker or implanted devices?    Answer:  No    Order Specific Question:  What is the patient's sedation requirement?    Answer:  No Sedation   The patient has a good understanding of the overall plan. she agrees with it. she will call with any problems that may develop before the next visit here.   GRulon Eisenmenger MD

## 2015-03-01 ENCOUNTER — Ambulatory Visit (HOSPITAL_COMMUNITY)
Admission: RE | Admit: 2015-03-01 | Discharge: 2015-03-01 | Disposition: A | Discharge status: Home / Self Care | Payer: BLUE CROSS/BLUE SHIELD | Source: Ambulatory Visit | Attending: Hematology and Oncology | Admitting: Hematology and Oncology

## 2015-03-01 ENCOUNTER — Other Ambulatory Visit: Payer: Self-pay | Admitting: *Deleted

## 2015-03-01 ENCOUNTER — Telehealth: Payer: Self-pay

## 2015-03-01 DIAGNOSIS — C50111 Malignant neoplasm of central portion of right female breast: Secondary | ICD-10-CM

## 2015-03-01 MED ORDER — ZOLPIDEM TARTRATE ER 12.5 MG PO TBCR: 12.5000 mg | EXTENDED_RELEASE_TABLET | Freq: Every evening | ORAL | Status: DC | PRN

## 2015-03-01 NOTE — Telephone Encounter (Signed)
MRI order faxed to [facility].  Sent to scan.

## 2015-03-01 NOTE — Progress Notes (Signed)
Echocardiogram 2D Echocardiogram has been performed.  Joelene Millin 03/01/2015, 9:53 AM

## 2015-03-06 ENCOUNTER — Other Ambulatory Visit (HOSPITAL_BASED_OUTPATIENT_CLINIC_OR_DEPARTMENT_OTHER): Payer: BLUE CROSS/BLUE SHIELD

## 2015-03-06 ENCOUNTER — Ambulatory Visit (HOSPITAL_BASED_OUTPATIENT_CLINIC_OR_DEPARTMENT_OTHER): Payer: BLUE CROSS/BLUE SHIELD

## 2015-03-06 VITALS — BP 102/46 | HR 85 | Temp 97.6°F | Resp 20

## 2015-03-06 DIAGNOSIS — C50111 Malignant neoplasm of central portion of right female breast: Secondary | ICD-10-CM

## 2015-03-06 DIAGNOSIS — C50919 Malignant neoplasm of unspecified site of unspecified female breast: Secondary | ICD-10-CM

## 2015-03-06 DIAGNOSIS — R110 Nausea: Secondary | ICD-10-CM | POA: Diagnosis not present

## 2015-03-06 DIAGNOSIS — Z803 Family history of malignant neoplasm of breast: Secondary | ICD-10-CM

## 2015-03-06 DIAGNOSIS — Z1502 Genetic susceptibility to malignant neoplasm of ovary: Secondary | ICD-10-CM

## 2015-03-06 DIAGNOSIS — Z1379 Encounter for other screening for genetic and chromosomal anomalies: Secondary | ICD-10-CM

## 2015-03-06 DIAGNOSIS — R112 Nausea with vomiting, unspecified: Secondary | ICD-10-CM

## 2015-03-06 DIAGNOSIS — E860 Dehydration: Secondary | ICD-10-CM

## 2015-03-06 DIAGNOSIS — R197 Diarrhea, unspecified: Secondary | ICD-10-CM

## 2015-03-06 DIAGNOSIS — Z1509 Genetic susceptibility to other malignant neoplasm: Secondary | ICD-10-CM

## 2015-03-06 DIAGNOSIS — F419 Anxiety disorder, unspecified: Secondary | ICD-10-CM

## 2015-03-06 DIAGNOSIS — Z1589 Genetic susceptibility to other disease: Secondary | ICD-10-CM

## 2015-03-06 LAB — COMPREHENSIVE METABOLIC PANEL (CC13)
ALBUMIN: 3.9 g/dL (ref 3.5–5.0)
ALK PHOS: 96 U/L (ref 40–150)
ALT: 25 U/L (ref 0–55)
ANION GAP: 10 meq/L (ref 3–11)
AST: 13 U/L (ref 5–34)
BILIRUBIN TOTAL: 0.52 mg/dL (ref 0.20–1.20)
BUN: 13.4 mg/dL (ref 7.0–26.0)
CALCIUM: 9.6 mg/dL (ref 8.4–10.4)
CO2: 26 mEq/L (ref 22–29)
CREATININE: 0.8 mg/dL (ref 0.6–1.1)
Chloride: 104 mEq/L (ref 98–109)
EGFR: 86 mL/min/{1.73_m2} — ABNORMAL LOW (ref >90)
Glucose: 97 mg/dl (ref 70–140)
Potassium: 3.6 mEq/L (ref 3.5–5.1)
Sodium: 140 mEq/L (ref 136–145)
Total Protein: 6.1 g/dL — ABNORMAL LOW (ref 6.4–8.3)

## 2015-03-06 LAB — CBC WITH DIFFERENTIAL/PLATELET
BASO%: 0.1 % (ref 0.0–2.0)
Basophils Absolute: 0 10*3/uL (ref 0.0–0.1)
EOS ABS: 0 10*3/uL (ref 0.0–0.5)
EOS%: 0 % (ref 0.0–7.0)
HEMATOCRIT: 26.6 % — AB (ref 34.8–46.6)
HGB: 9.3 g/dL — ABNORMAL LOW (ref 11.6–15.9)
LYMPH#: 3.5 10*3/uL — AB (ref 0.9–3.3)
LYMPH%: 55.1 % — ABNORMAL HIGH (ref 14.0–49.7)
MCH: 34.2 pg — ABNORMAL HIGH (ref 25.1–34.0)
MCHC: 35 g/dL (ref 31.5–36.0)
MCV: 97.7 fL (ref 79.5–101.0)
MONO#: 1.2 10*3/uL — AB (ref 0.1–0.9)
MONO%: 19.7 % — ABNORMAL HIGH (ref 0.0–14.0)
NEUT%: 25.1 % — AB (ref 38.4–76.8)
NEUTROS ABS: 1.6 10*3/uL (ref 1.5–6.5)
PLATELETS: 281 10*3/uL (ref 145–400)
RBC: 2.73 10*6/uL — ABNORMAL LOW (ref 3.70–5.45)
RDW: 19.4 % — ABNORMAL HIGH (ref 11.2–14.5)
WBC: 6.3 10*3/uL (ref 3.9–10.3)

## 2015-03-06 LAB — HOLD TUBE, BLOOD BANK

## 2015-03-06 MED ORDER — PALONOSETRON HCL INJECTION 0.25 MG/5ML
INTRAVENOUS | Status: AC
Start: 2015-03-06 — End: 2015-03-06
  Filled 2015-03-06: qty 5

## 2015-03-06 MED ORDER — SODIUM CHLORIDE 0.9 % IJ SOLN
10.0000 mL | INTRAMUSCULAR | Status: DC | PRN
  Administered 2015-03-06: 10 mL
  Filled 2015-03-06: qty 10

## 2015-03-06 MED ORDER — HEPARIN SOD (PORK) LOCK FLUSH 100 UNIT/ML IV SOLN
500.0000 [IU] | Freq: Once | INTRAVENOUS | Status: AC | PRN
  Administered 2015-03-06: 500 [IU]
  Filled 2015-03-06: qty 5

## 2015-03-06 MED ORDER — SODIUM CHLORIDE 0.9 % IV SOLN
Freq: Once | INTRAVENOUS | Status: AC
  Administered 2015-03-06: 09:00:00 via INTRAVENOUS

## 2015-03-06 MED ORDER — PALONOSETRON HCL INJECTION 0.25 MG/5ML
0.2500 mg | Freq: Once | INTRAVENOUS | Status: AC
  Administered 2015-03-06: 0.25 mg via INTRAVENOUS

## 2015-03-06 NOTE — Patient Instructions (Signed)
Dehydration, Adult Dehydration is when you lose more fluids from the body than you take in. Vital organs like the kidneys, brain, and heart cannot function without a proper amount of fluids and salt. Any loss of fluids from the body can cause dehydration.  CAUSES   Vomiting.  Diarrhea.  Excessive sweating.  Excessive urine output.  Fever. SYMPTOMS  Mild dehydration  Thirst.  Dry lips.  Slightly dry mouth. Moderate dehydration  Very dry mouth.  Sunken eyes.  Skin does not bounce back quickly when lightly pinched and released.  Dark urine and decreased urine production.  Decreased tear production.  Headache. Severe dehydration  Very dry mouth.  Extreme thirst.  Rapid, weak pulse (more than 100 beats per minute at rest).  Cold hands and feet.  Not able to sweat in spite of heat and temperature.  Rapid breathing.  Blue lips.  Confusion and lethargy.  Difficulty being awakened.  Minimal urine production.  No tears. DIAGNOSIS  Your caregiver will diagnose dehydration based on your symptoms and your exam. Blood and urine tests will help confirm the diagnosis. The diagnostic evaluation should also identify the cause of dehydration. TREATMENT  Treatment of mild or moderate dehydration can often be done at home by increasing the amount of fluids that you drink. It is best to drink small amounts of fluid more often. Drinking too much at one time can make vomiting worse. Refer to the home care instructions below. Severe dehydration needs to be treated at the hospital where you will probably be given intravenous (IV) fluids that contain water and electrolytes. HOME CARE INSTRUCTIONS   Ask your caregiver about specific rehydration instructions.  Drink enough fluids to keep your urine clear or pale yellow.  Drink small amounts frequently if you have nausea and vomiting.  Eat as you normally do.  Avoid:  Foods or drinks high in sugar.  Carbonated  drinks.  Juice.  Extremely hot or cold fluids.  Drinks with caffeine.  Fatty, greasy foods.  Alcohol.  Tobacco.  Overeating.  Gelatin desserts.  Wash your hands well to avoid spreading bacteria and viruses.  Only take over-the-counter or prescription medicines for pain, discomfort, or fever as directed by your caregiver.  Ask your caregiver if you should continue all prescribed and over-the-counter medicines.  Keep all follow-up appointments with your caregiver. SEEK MEDICAL CARE IF:  You have abdominal pain and it increases or stays in one area (localizes).  You have a rash, stiff neck, or severe headache.  You are irritable, sleepy, or difficult to awaken.  You are weak, dizzy, or extremely thirsty. SEEK IMMEDIATE MEDICAL CARE IF:   You are unable to keep fluids down or you get worse despite treatment.  You have frequent episodes of vomiting or diarrhea.  You have blood or green matter (bile) in your vomit.  You have blood in your stool or your stool looks black and tarry.  You have not urinated in 6 to 8 hours, or you have only urinated a small amount of very dark urine.  You have a fever.  You faint. MAKE SURE YOU:   Understand these instructions.  Will watch your condition.  Will get help right away if you are not doing well or get worse. Document Released: 07/06/2005 Document Revised: 09/28/2011 Document Reviewed: 02/23/2011 Exit[employer] Patient Information 2015 [employer]. This information is not intended to replace advice given to you by your health care provider. Make sure you discuss any questions you have with your health care   provider.  

## 2015-03-07 ENCOUNTER — Telehealth: Payer: Self-pay | Admitting: Hematology and Oncology

## 2015-03-07 ENCOUNTER — Other Ambulatory Visit: Payer: Self-pay | Admitting: *Deleted

## 2015-03-07 NOTE — Telephone Encounter (Signed)
ivf added per pof and patient will get a new schedule on 8/19

## 2015-03-07 NOTE — Telephone Encounter (Signed)
Per vm from [place] there ivf date should be 9/7 after dr Hildred Laser

## 2015-03-08 ENCOUNTER — Ambulatory Visit (HOSPITAL_BASED_OUTPATIENT_CLINIC_OR_DEPARTMENT_OTHER): Payer: BLUE CROSS/BLUE SHIELD

## 2015-03-08 VITALS — BP 106/52 | HR 87 | Temp 97.3°F | Resp 18

## 2015-03-08 DIAGNOSIS — E860 Dehydration: Secondary | ICD-10-CM

## 2015-03-08 DIAGNOSIS — Z1509 Genetic susceptibility to other malignant neoplasm: Secondary | ICD-10-CM

## 2015-03-08 DIAGNOSIS — R112 Nausea with vomiting, unspecified: Secondary | ICD-10-CM | POA: Diagnosis not present

## 2015-03-08 DIAGNOSIS — C50111 Malignant neoplasm of central portion of right female breast: Secondary | ICD-10-CM

## 2015-03-08 DIAGNOSIS — R197 Diarrhea, unspecified: Secondary | ICD-10-CM

## 2015-03-08 DIAGNOSIS — Z1502 Genetic susceptibility to malignant neoplasm of ovary: Secondary | ICD-10-CM

## 2015-03-08 DIAGNOSIS — F419 Anxiety disorder, unspecified: Secondary | ICD-10-CM

## 2015-03-08 DIAGNOSIS — Z803 Family history of malignant neoplasm of breast: Secondary | ICD-10-CM

## 2015-03-08 DIAGNOSIS — Z1379 Encounter for other screening for genetic and chromosomal anomalies: Secondary | ICD-10-CM

## 2015-03-08 DIAGNOSIS — C50919 Malignant neoplasm of unspecified site of unspecified female breast: Secondary | ICD-10-CM

## 2015-03-08 DIAGNOSIS — Z1589 Genetic susceptibility to other disease: Secondary | ICD-10-CM

## 2015-03-08 MED ORDER — SODIUM CHLORIDE 0.9 % IJ SOLN
10.0000 mL | INTRAMUSCULAR | Status: DC | PRN
  Administered 2015-03-08: 10 mL
  Filled 2015-03-08: qty 10

## 2015-03-08 MED ORDER — SODIUM CHLORIDE 0.9 % IV SOLN
Freq: Once | INTRAVENOUS | Status: AC
  Administered 2015-03-08: 10:00:00 via INTRAVENOUS

## 2015-03-08 MED ORDER — HEPARIN SOD (PORK) LOCK FLUSH 100 UNIT/ML IV SOLN
500.0000 [IU] | Freq: Once | INTRAVENOUS | Status: AC | PRN
  Administered 2015-03-08: 500 [IU]
  Filled 2015-03-08: qty 5

## 2015-03-08 MED ORDER — PALONOSETRON HCL INJECTION 0.25 MG/5ML
INTRAVENOUS | Status: AC
  Filled 2015-03-08: qty 5

## 2015-03-08 MED ORDER — PALONOSETRON HCL INJECTION 0.25 MG/5ML
0.2500 mg | Freq: Once | INTRAVENOUS | Status: AC
  Administered 2015-03-08: 0.25 mg via INTRAVENOUS

## 2015-03-08 NOTE — Progress Notes (Signed)
Pt. Requesting 500 cc IVF only today as she is feeling better and drinking fluids well.   Aloxi given as ordered.

## 2015-03-08 NOTE — Patient Instructions (Signed)
Dehydration, Adult Dehydration is when you lose more fluids from the body than you take in. Vital organs like the kidneys, brain, and heart cannot function without a proper amount of fluids and salt. Any loss of fluids from the body can cause dehydration.  CAUSES   Vomiting.  Diarrhea.  Excessive sweating.  Excessive urine output.  Fever. SYMPTOMS  Mild dehydration  Thirst.  Dry lips.  Slightly dry mouth. Moderate dehydration  Very dry mouth.  Sunken eyes.  Skin does not bounce back quickly when lightly pinched and released.  Dark urine and decreased urine production.  Decreased tear production.  Headache. Severe dehydration  Very dry mouth.  Extreme thirst.  Rapid, weak pulse (more than 100 beats per minute at rest).  Cold hands and feet.  Not able to sweat in spite of heat and temperature.  Rapid breathing.  Blue lips.  Confusion and lethargy.  Difficulty being awakened.  Minimal urine production.  No tears. DIAGNOSIS  Your caregiver will diagnose dehydration based on your symptoms and your exam. Blood and urine tests will help confirm the diagnosis. The diagnostic evaluation should also identify the cause of dehydration. TREATMENT  Treatment of mild or moderate dehydration can often be done at home by increasing the amount of fluids that you drink. It is best to drink small amounts of fluid more often. Drinking too much at one time can make vomiting worse. Refer to the home care instructions below. Severe dehydration needs to be treated at the hospital where you will probably be given intravenous (IV) fluids that contain water and electrolytes. HOME CARE INSTRUCTIONS   Ask your caregiver about specific rehydration instructions.  Drink enough fluids to keep your urine clear or pale yellow.  Drink small amounts frequently if you have nausea and vomiting.  Eat as you normally do.  Avoid:  Foods or drinks high in sugar.  Carbonated  drinks.  Juice.  Extremely hot or cold fluids.  Drinks with caffeine.  Fatty, greasy foods.  Alcohol.  Tobacco.  Overeating.  Gelatin desserts.  Wash your hands well to avoid spreading bacteria and viruses.  Only take over-the-counter or prescription medicines for pain, discomfort, or fever as directed by your caregiver.  Ask your caregiver if you should continue all prescribed and over-the-counter medicines.  Keep all follow-up appointments with your caregiver. SEEK MEDICAL CARE IF:  You have abdominal pain and it increases or stays in one area (localizes).  You have a rash, stiff neck, or severe headache.  You are irritable, sleepy, or difficult to awaken.  You are weak, dizzy, or extremely thirsty. SEEK IMMEDIATE MEDICAL CARE IF:   You are unable to keep fluids down or you get worse despite treatment.  You have frequent episodes of vomiting or diarrhea.  You have blood or green matter (bile) in your vomit.  You have blood in your stool or your stool looks black and tarry.  You have not urinated in 6 to 8 hours, or you have only urinated a small amount of very dark urine.  You have a fever.  You faint. MAKE SURE YOU:   Understand these instructions.  Will watch your condition.  Will get help right away if you are not doing well or get worse. Document Released: 07/06/2005 Document Revised: 09/28/2011 Document Reviewed: 02/23/2011 Exit[employer] Patient Information 2015 [employer]. This information is not intended to replace advice given to you by your health care provider. Make sure you discuss any questions you have with your health care   provider.  

## 2015-03-20 ENCOUNTER — Other Ambulatory Visit: Payer: Self-pay | Admitting: *Deleted

## 2015-03-20 ENCOUNTER — Ambulatory Visit (HOSPITAL_BASED_OUTPATIENT_CLINIC_OR_DEPARTMENT_OTHER): Payer: BLUE CROSS/BLUE SHIELD | Admitting: Hematology and Oncology

## 2015-03-20 ENCOUNTER — Other Ambulatory Visit (HOSPITAL_BASED_OUTPATIENT_CLINIC_OR_DEPARTMENT_OTHER): Payer: BLUE CROSS/BLUE SHIELD

## 2015-03-20 ENCOUNTER — Ambulatory Visit (HOSPITAL_BASED_OUTPATIENT_CLINIC_OR_DEPARTMENT_OTHER): Payer: BLUE CROSS/BLUE SHIELD

## 2015-03-20 ENCOUNTER — Telehealth: Payer: Self-pay | Admitting: Hematology and Oncology

## 2015-03-20 ENCOUNTER — Encounter: Payer: Self-pay | Admitting: Hematology and Oncology

## 2015-03-20 VITALS — BP 108/60 | HR 84 | Temp 97.7°F | Resp 18 | Ht 62.5 in | Wt 116.7 lb

## 2015-03-20 DIAGNOSIS — Z5112 Encounter for antineoplastic immunotherapy: Secondary | ICD-10-CM

## 2015-03-20 DIAGNOSIS — C50111 Malignant neoplasm of central portion of right female breast: Secondary | ICD-10-CM

## 2015-03-20 DIAGNOSIS — D6481 Anemia due to antineoplastic chemotherapy: Secondary | ICD-10-CM | POA: Diagnosis not present

## 2015-03-20 DIAGNOSIS — D696 Thrombocytopenia, unspecified: Secondary | ICD-10-CM | POA: Diagnosis not present

## 2015-03-20 DIAGNOSIS — Z5111 Encounter for antineoplastic chemotherapy: Secondary | ICD-10-CM | POA: Diagnosis not present

## 2015-03-20 DIAGNOSIS — Z5189 Encounter for other specified aftercare: Secondary | ICD-10-CM | POA: Diagnosis not present

## 2015-03-20 DIAGNOSIS — F329 Major depressive disorder, single episode, unspecified: Secondary | ICD-10-CM | POA: Diagnosis not present

## 2015-03-20 LAB — CBC WITH DIFFERENTIAL/PLATELET
BASO%: 0.1 % (ref 0.0–2.0)
Basophils Absolute: 0 10*3/uL (ref 0.0–0.1)
EOS ABS: 0 10*3/uL (ref 0.0–0.5)
EOS%: 0 % (ref 0.0–7.0)
HEMATOCRIT: 23.4 % — AB (ref 34.8–46.6)
HGB: 7.8 g/dL — ABNORMAL LOW (ref 11.6–15.9)
LYMPH#: 1.2 10*3/uL (ref 0.9–3.3)
LYMPH%: 21.8 % (ref 14.0–49.7)
MCH: 34.2 pg — ABNORMAL HIGH (ref 25.1–34.0)
MCHC: 33.3 g/dL (ref 31.5–36.0)
MCV: 102.7 fL — AB (ref 79.5–101.0)
MONO#: 0.2 10*3/uL (ref 0.1–0.9)
MONO%: 3.9 % (ref 0.0–14.0)
NEUT%: 74.2 % (ref 38.4–76.8)
NEUTROS ABS: 3.9 10*3/uL (ref 1.5–6.5)
PLATELETS: 79 10*3/uL — AB (ref 145–400)
RBC: 2.28 10*6/uL — AB (ref 3.70–5.45)
RDW: 19.2 % — ABNORMAL HIGH (ref 11.2–14.5)
WBC: 5.3 10*3/uL (ref 3.9–10.3)

## 2015-03-20 LAB — COMPREHENSIVE METABOLIC PANEL (CC13)
ALBUMIN: 3.9 g/dL (ref 3.5–5.0)
ALK PHOS: 72 U/L (ref 40–150)
ALT: 25 U/L (ref 0–55)
ANION GAP: 9 meq/L (ref 3–11)
AST: 18 U/L (ref 5–34)
BILIRUBIN TOTAL: 0.49 mg/dL (ref 0.20–1.20)
BUN: 14.7 mg/dL (ref 7.0–26.0)
CALCIUM: 9.7 mg/dL (ref 8.4–10.4)
CO2: 22 mEq/L (ref 22–29)
CREATININE: 0.8 mg/dL (ref 0.6–1.1)
Chloride: 113 mEq/L — ABNORMAL HIGH (ref 98–109)
EGFR: 89 mL/min/{1.73_m2} — AB (ref >90)
Glucose: 126 mg/dl (ref 70–140)
Potassium: 4.5 mEq/L (ref 3.5–5.1)
Sodium: 144 mEq/L (ref 136–145)
TOTAL PROTEIN: 6.3 g/dL — AB (ref 6.4–8.3)

## 2015-03-20 MED ORDER — DIPHENHYDRAMINE HCL 25 MG PO CAPS
50.0000 mg | ORAL_CAPSULE | Freq: Once | ORAL | Status: AC
  Administered 2015-03-20: 50 mg via ORAL

## 2015-03-20 MED ORDER — DOCETAXEL CHEMO INJECTION 160 MG/16ML
65.0000 mg/m2 | Freq: Once | INTRAVENOUS | Status: AC
  Administered 2015-03-20: 100 mg via INTRAVENOUS
  Filled 2015-03-20: qty 10

## 2015-03-20 MED ORDER — DIPHENHYDRAMINE HCL 25 MG PO CAPS
ORAL_CAPSULE | ORAL | Status: AC
Start: 2015-03-20 — End: 2015-03-20
  Filled 2015-03-20: qty 2

## 2015-03-20 MED ORDER — PALONOSETRON HCL INJECTION 0.25 MG/5ML
INTRAVENOUS | Status: AC
  Filled 2015-03-20: qty 5

## 2015-03-20 MED ORDER — FOSAPREPITANT DIMEGLUMINE INJECTION 150 MG
Freq: Once | INTRAVENOUS | Status: AC
  Administered 2015-03-20: 11:00:00 via INTRAVENOUS
  Filled 2015-03-20: qty 5

## 2015-03-20 MED ORDER — PEGFILGRASTIM 6 MG/0.6ML ~~LOC~~ PSKT
6.0000 mg | PREFILLED_SYRINGE | Freq: Once | SUBCUTANEOUS | Status: AC
  Administered 2015-03-20: 6 mg via SUBCUTANEOUS
  Filled 2015-03-20: qty 0.6

## 2015-03-20 MED ORDER — SODIUM CHLORIDE 0.9 % IV SOLN
505.0000 mg | Freq: Once | INTRAVENOUS | Status: AC
  Administered 2015-03-20: 510 mg via INTRAVENOUS
  Filled 2015-03-20: qty 51

## 2015-03-20 MED ORDER — ACETAMINOPHEN 325 MG PO TABS
ORAL_TABLET | ORAL | Status: AC
  Filled 2015-03-20: qty 2

## 2015-03-20 MED ORDER — PALONOSETRON HCL INJECTION 0.25 MG/5ML
0.2500 mg | Freq: Once | INTRAVENOUS | Status: AC
  Administered 2015-03-20: 0.25 mg via INTRAVENOUS

## 2015-03-20 MED ORDER — SODIUM CHLORIDE 0.9 % IV SOLN
6.0000 mg/kg | Freq: Once | INTRAVENOUS | Status: AC
  Administered 2015-03-20: 315 mg via INTRAVENOUS
  Filled 2015-03-20: qty 15

## 2015-03-20 MED ORDER — SODIUM CHLORIDE 0.9 % IV SOLN
420.0000 mg | Freq: Once | INTRAVENOUS | Status: AC
  Administered 2015-03-20: 420 mg via INTRAVENOUS
  Filled 2015-03-20: qty 14

## 2015-03-20 MED ORDER — SODIUM CHLORIDE 0.9 % IJ SOLN
10.0000 mL | INTRAMUSCULAR | Status: DC | PRN
  Administered 2015-03-20: 10 mL
  Filled 2015-03-20: qty 10

## 2015-03-20 MED ORDER — HEPARIN SOD (PORK) LOCK FLUSH 100 UNIT/ML IV SOLN
500.0000 [IU] | Freq: Once | INTRAVENOUS | Status: AC | PRN
  Administered 2015-03-20: 500 [IU]
  Filled 2015-03-20: qty 5

## 2015-03-20 MED ORDER — ACETAMINOPHEN 325 MG PO TABS
650.0000 mg | ORAL_TABLET | Freq: Once | ORAL | Status: AC
  Administered 2015-03-20: 650 mg via ORAL

## 2015-03-20 MED ORDER — SODIUM CHLORIDE 0.9 % IV SOLN
Freq: Once | INTRAVENOUS | Status: AC
  Administered 2015-03-20: 10:00:00 via INTRAVENOUS

## 2015-03-20 NOTE — Progress Notes (Signed)
OK to treat with platelets of 79 and hgb 7.8.  Will be rechecking Hgb 9/7. Per Dr Lindi Adie.

## 2015-03-20 NOTE — Progress Notes (Signed)
Patient Care Team: Olga Millers, MD as PCP - General (Obstetrics and Gynecology)  DIAGNOSIS: No matching staging information was found for the patient.  SUMMARY OF ONCOLOGIC HISTORY:   Cancer of central portion of right female breast   11/14/2014 Initial Diagnosis Right breast biopsy 8:30 position: Invasive ductal carcinoma with DCIS, grade 2, ER 0%, PR 0%, HER-2 positive ratio 3.53   11/20/2014 Breast MRI Right breast: 3.7 x 2.8 x 1.7 cm area of non-mass enhancement and retroareolar right breast into the lower outer quadrant and lower inner quadrant extending into the nipple involving anterior two thirds of the breast, no lymph nodes   12/04/2014 -  Neo-Adjuvant Chemotherapy Taxotere, carboplatin, Herceptin, Perjeta 6 cycles followed by Herceptin maintenance for 1 year   12/19/2014 Procedure PALB2 Mutation    CHIEF COMPLIANT: Cycle 6 of TCH Perjeta  INTERVAL HISTORY: Robin Miles is a 39 year old with above-mentioned history of right-sided breast cancer currently on new adjuvant chemotherapy with Arrey Perjeta. She is tolerating extremely well. She has not had too much nausea vomiting. She currently receives Aloxi 2 days in between treatments. This has been holding her from becoming too sick. She has been working two12 hour shifts throughout the treatment. She does feel tired. She denies any shortness of breath exertion. Denies any palpitations or dizziness. Denies neuropathy  REVIEW OF SYSTEMS:   Constitutional: Denies fevers, chills or abnormal weight loss Eyes: Denies blurriness of vision Ears, nose, mouth, throat, and face: Denies mucositis or sore throat Respiratory: Denies cough, dyspnea or wheezes Cardiovascular: Denies palpitation, chest discomfort or lower extremity swelling Gastrointestinal:  Denies nausea, heartburn or change in bowel habits Skin: Denies abnormal skin rashes Lymphatics: Denies new lymphadenopathy or easy bruising Neurological:Denies numbness, tingling or new  weaknesses Behavioral/Psych: Mood is stable, no new changes  All other systems were reviewed with the patient and are negative.  I have reviewed the past medical history, past surgical history, social history and family history with the patient and they are unchanged from previous note.  ALLERGIES:  has No Known Allergies.  MEDICATIONS:  Current Outpatient Prescriptions  Medication Sig Dispense Refill  . ALPRAZolam (XANAX) 0.5 MG tablet Take 0.5 mg by mouth daily as needed for anxiety.     . Alum & Mag Hydroxide-Simeth (MAGIC MOUTHWASH W/LIDOCAINE) SOLN 5-5m  Four times daily as needed for mouth discomfort 240 mL 1  . dexamethasone (DECADRON) 4 MG tablet Take 1 tablet (4 mg total) by mouth 2 (two) times daily. Start the day before Taxotere. Then again the day after chemo for 3 days. 30 tablet 2  . diphenoxylate-atropine (LOMOTIL) 2.5-0.025 MG per tablet Take 1 tablet by mouth 4 (four) times daily as needed for diarrhea or loose stools. 30 tablet 3  . HYDROcodone-acetaminophen (NORCO/VICODIN) 5-325 MG per tablet Take 1-2 tablets by mouth every 4 (four) hours as needed for moderate pain or severe pain. 25 tablet 0  . lidocaine-prilocaine (EMLA) cream Apply to affected area once 30 g 3  . loratadine (CLARITIN) 10 MG tablet Take 10 mg by mouth daily.    .Marland KitchenLORazepam (ATIVAN) 0.5 MG tablet Take 1 tablet (0.5 mg total) by mouth every 6 (six) hours as needed for anxiety or sleep. 30 tablet 2  . Omeprazole Magnesium (PRILOSEC OTC PO) Take 1 tablet by mouth daily.    . ondansetron (ZOFRAN) 8 MG tablet Take 1 tablet (8 mg total) by mouth 2 (two) times daily. Start the day after chemo for 3 days. Then take as  needed for nausea or vomiting. 30 tablet 1  . pegfilgrastim (NEULASTA) 6 MG/0.6ML injection Inject 6 mg into the skin once.    Marland Kitchen PRESCRIPTION MEDICATION Chemo--[facility].    . Probiotic Product (PROBIOTIC PO) Take 1 capsule by mouth daily.    . prochlorperazine (COMPAZINE) 10 MG tablet Take 1 tablet  (10 mg total) by mouth every 6 (six) hours as needed (Nausea or vomiting). 30 tablet 1  . promethazine (PHENERGAN) 25 MG suppository Place 1 suppository (25 mg total) rectally every 6 (six) hours as needed for nausea or vomiting. 12 each 0  . promethazine (PHENERGAN) 25 MG tablet Take 1 tablet (25 mg total) by mouth every 6 (six) hours as needed for nausea or vomiting. 30 tablet 0  . UNABLE TO FIND 1 each by Other route once. Dispense cranial prosthesis per medical necessity for alopecia due to chemotherapy secondary to known breast cancer diagnosis. 1 each 0  . zolpidem (AMBIEN CR) 12.5 MG CR tablet Take 1 tablet (12.5 mg total) by mouth at bedtime as needed for sleep. 30 tablet 1   No current facility-administered medications for this visit.    PHYSICAL EXAMINATION: ECOG PERFORMANCE STATUS: 1 - Symptomatic but completely ambulatory  Filed Vitals:   03/20/15 0906  BP: 108/60  Pulse: 84  Temp: 97.7 F (36.5 C)  Resp: 18   Filed Weights   03/20/15 0906  Weight: 116 lb 11.2 oz (52.935 kg)    GENERAL:alert, no distress and comfortable SKIN: skin color, texture, turgor are normal, no rashes or significant lesions EYES: normal, Conjunctiva are pink and non-injected, sclera clear OROPHARYNX:no exudate, no erythema and lips, buccal mucosa, and tongue normal  NECK: supple, thyroid normal size, non-tender, without nodularity LYMPH:  no palpable lymphadenopathy in the cervical, axillary or inguinal LUNGS: clear to auscultation and percussion with normal breathing effort HEART: regular rate & rhythm and no murmurs and no lower extremity edema ABDOMEN:abdomen soft, non-tender and normal bowel sounds Musculoskeletal:no cyanosis of digits and no clubbing  NEURO: alert & oriented x 3 with fluent speech, no focal motor/sensory deficits  LABORATORY DATA:  I have reviewed the data as listed   Chemistry      Component Value Date/Time   NA 140 03/06/2015 0846   NA 136 01/02/2015 2217   K 3.6  03/06/2015 0846   K 3.8 01/02/2015 2217   CL 101 01/02/2015 2217   CO2 26 03/06/2015 0846   CO2 23 01/02/2015 2217   BUN 13.4 03/06/2015 0846   BUN 12 01/02/2015 2217   CREATININE 0.8 03/06/2015 0846   CREATININE 0.81 01/02/2015 2217      Component Value Date/Time   CALCIUM 9.6 03/06/2015 0846   CALCIUM 9.3 01/02/2015 2217   ALKPHOS 96 03/06/2015 0846   ALKPHOS 87 01/02/2015 2217   AST 13 03/06/2015 0846   AST 16 01/02/2015 2217   ALT 25 03/06/2015 0846   ALT 19 01/02/2015 2217   BILITOT 0.52 03/06/2015 0846   BILITOT 1.5* 01/02/2015 2217       Lab Results  Component Value Date   WBC 5.3 03/20/2015   HGB 7.8* 03/20/2015   HCT 23.4* 03/20/2015   MCV 102.7* 03/20/2015   PLT 79* 03/20/2015   NEUTROABS 3.9 03/20/2015   ASSESSMENT & PLAN:  Cancer of central portion of right female breast Right breast retroareolar mass 3.7 x 2.8 x 1.7 cm involving anterior two thirds of the breast, T2 N0 M0 stage II a clinical stage, ER 0%, appears 0%, HER-2  positive ratio 3.53, PALB 2 mutation  Current Treatment: TCHP today is cycle 6 day 1 (last chemotherapy) Chemo monitoring: 1. Diarrhea: I added Lomotil to Imodium 2. Fatigue over the weekend 3. Nausea during chemotherapy improved with Aloxi but patient had delayed nausea and vomiting. Added Emend to cycle 3. In spite of this patient had nausea and vomiting for 1 day. We will add Aloxi on day 8 and day 10. We will also be adding oral dexamethasone 1 mg daily 4. Bone pain from neulasta: recommended claritin and NSAIDs PRN 5. 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 6. Depression: started on venlafaxine 37.38m daily 6. Abnormal bilirubin: Bilirubin has been up and down 7. Sinusitis: treated with Z-pak 8. Thrombocytopenia: Platelets 79, after lengthy discussion, we elected to go ahead with the treatment today. With a slight reduction in the chemotherapy  dosage. We will need to check her platelet count next week to determine if it goes significantly lower to where she might need transfusion of blood or platelets.  9. Chemotherapy-induced anemia: Hemoglobin declining to 7.8 today. I discussed with her about blood transfusion. She does feel tired but not seriously short of breath. We will try sample of blood with the next week's lab work for type and cross in case she would need blood.  Monitoring closely for chemotherapy side effects ECHO 03/01/2015: 55-60% EF Return to clinic in 1 week for fluids and Aloxi Aloxi added to day 8 and day 10  Plan: Breast MRI 03/21/2015, clinic follow-up 03/27/2015 and she has follow-ups with Dr. HExcell Seltzerand Dr. TLeland Johnswith plastic surgery. Given the PALB 2 mutation, she will need bilateral mastectomies   No orders of the defined types were placed in this encounter.   The patient has a good understanding of the overall plan. she agrees with it. she will call with any problems that may develop before the next visit here.   GRulon Eisenmenger MD

## 2015-03-20 NOTE — Progress Notes (Signed)
Ok to treat with platelets 79 per Dr. Lindi Adie.

## 2015-03-20 NOTE — Telephone Encounter (Signed)
Gave avs & calendar for September °

## 2015-03-20 NOTE — Assessment & Plan Note (Signed)
Right breast retroareolar mass 3.7 x 2.8 x 1.7 cm involving anterior two thirds of the breast, T2 N0 M0 stage II a clinical stage, ER 0%, appears 0%, HER-2 positive ratio 3.53, PALB 2 mutation  Current Treatment: TCHP today is cycle 6 day 1 (last chemotherapy) Chemo monitoring: 1. Diarrhea: I added Lomotil to Imodium 2. Fatigue over the weekend 3. Nausea during chemotherapy improved with Aloxi but patient had delayed nausea and vomiting. Added Emend to cycle 3. In spite of this patient had nausea and vomiting for 1 day. We will add Aloxi on day 8 and day 10. We will also be adding oral dexamethasone 1 mg daily 4. Bone pain from neulasta: recommended claritin and NSAIDs PRN 5. 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 6. Depression: started on venlafaxine 37.17m daily 6. Abnormal bilirubin: Bilirubin has been up and down 7. Sinusitis: treated with Z-pak 8. Thrombocytopenia: Platelets improved.  9. Chemotherapy-induced anemia: Hemoglobin declining to 8.3 today. If hemoglobin drops below 8 we will transfuse 2 units of packed red cells. We will recheck her hemoglobin on 03/06/2015.   Monitoring closely for chemotherapy side effects ECHO 03/01/2015: 55-60% EF Return to clinic in 1 week for fluids and Aloxi Aloxi added to day 8 and day 10 Plan: Breast MRI 03/21/2015, clinic follow-up 03/27/2015 and she has follow-ups with Dr. HExcell Seltzerand Dr. TLeland Johnswith plastic surgery. Given the PALB 2 mutation, she will need bilateral mastectomies

## 2015-03-21 ENCOUNTER — Ambulatory Visit: Payer: BLUE CROSS/BLUE SHIELD

## 2015-03-21 ENCOUNTER — Other Ambulatory Visit: Payer: Self-pay | Admitting: *Deleted

## 2015-03-21 ENCOUNTER — Ambulatory Visit (HOSPITAL_BASED_OUTPATIENT_CLINIC_OR_DEPARTMENT_OTHER): Payer: BLUE CROSS/BLUE SHIELD

## 2015-03-21 ENCOUNTER — Encounter: Payer: Self-pay | Admitting: *Deleted

## 2015-03-21 ENCOUNTER — Telehealth: Payer: Self-pay | Admitting: *Deleted

## 2015-03-21 ENCOUNTER — Ambulatory Visit
Admission: RE | Admit: 2015-03-21 | Discharge: 2015-03-21 | Disposition: A | Discharge status: Home / Self Care | Payer: BLUE CROSS/BLUE SHIELD | Source: Ambulatory Visit | Attending: Hematology and Oncology | Admitting: Hematology and Oncology

## 2015-03-21 ENCOUNTER — Telehealth: Payer: Self-pay | Admitting: Hematology and Oncology

## 2015-03-21 VITALS — BP 104/61 | HR 80 | Temp 97.6°F

## 2015-03-21 DIAGNOSIS — C50111 Malignant neoplasm of central portion of right female breast: Secondary | ICD-10-CM

## 2015-03-21 DIAGNOSIS — D6481 Anemia due to antineoplastic chemotherapy: Secondary | ICD-10-CM

## 2015-03-21 MED ORDER — GADOBENATE DIMEGLUMINE 529 MG/ML IV SOLN
9.0000 mL | Freq: Once | INTRAVENOUS | Status: AC | PRN
  Administered 2015-03-21: 9 mL via INTRAVENOUS

## 2015-03-21 MED ORDER — PEGFILGRASTIM INJECTION 6 MG/0.6ML ~~LOC~~
6.0000 mg | PREFILLED_SYRINGE | Freq: Once | SUBCUTANEOUS | Status: DC
  Administered 2015-03-21: 6 mg via SUBCUTANEOUS
  Filled 2015-03-21: qty 0.6

## 2015-03-21 NOTE — Progress Notes (Signed)
Emarie had to remove Neulasta OnPro due to having MRI this morning.  Returned to office for Neulasta injection to be given.

## 2015-03-21 NOTE — Telephone Encounter (Signed)
Added 9/9 inf per pof and patient will get a new schedule in chemo today

## 2015-03-21 NOTE — Telephone Encounter (Signed)
PT. ALSO CONTACTED Carl Best, [place]. SHE WILL NOT BE ABLE TO HAVE HER MRI WITH THE ON PRO NEULASTA  KIT. KEISHA SPOKE WITH DR.GUDENA AND CALLED PT. THE ON PRO NEULASTA KIT WILL BE REMOVED AND THE MRI WILL BE DONE. PT. WILL COME TO THIS OFFICE TO HAVE HER NEULASTA  INJECTION.

## 2015-03-27 ENCOUNTER — Ambulatory Visit (HOSPITAL_BASED_OUTPATIENT_CLINIC_OR_DEPARTMENT_OTHER): Payer: BLUE CROSS/BLUE SHIELD

## 2015-03-27 ENCOUNTER — Other Ambulatory Visit (HOSPITAL_BASED_OUTPATIENT_CLINIC_OR_DEPARTMENT_OTHER): Payer: BLUE CROSS/BLUE SHIELD

## 2015-03-27 ENCOUNTER — Encounter: Payer: Self-pay | Admitting: Hematology and Oncology

## 2015-03-27 ENCOUNTER — Telehealth: Payer: Self-pay | Admitting: Hematology and Oncology

## 2015-03-27 ENCOUNTER — Ambulatory Visit (HOSPITAL_BASED_OUTPATIENT_CLINIC_OR_DEPARTMENT_OTHER): Payer: BLUE CROSS/BLUE SHIELD | Admitting: Hematology and Oncology

## 2015-03-27 VITALS — BP 94/49 | HR 58 | Resp 18

## 2015-03-27 VITALS — BP 100/57 | HR 86 | Temp 98.4°F | Resp 18 | Ht 62.5 in | Wt 113.7 lb

## 2015-03-27 DIAGNOSIS — C50111 Malignant neoplasm of central portion of right female breast: Secondary | ICD-10-CM

## 2015-03-27 DIAGNOSIS — R110 Nausea: Secondary | ICD-10-CM | POA: Diagnosis not present

## 2015-03-27 DIAGNOSIS — R109 Unspecified abdominal pain: Secondary | ICD-10-CM

## 2015-03-27 DIAGNOSIS — Z1379 Encounter for other screening for genetic and chromosomal anomalies: Secondary | ICD-10-CM

## 2015-03-27 DIAGNOSIS — Z803 Family history of malignant neoplasm of breast: Secondary | ICD-10-CM

## 2015-03-27 DIAGNOSIS — E860 Dehydration: Secondary | ICD-10-CM

## 2015-03-27 DIAGNOSIS — C50919 Malignant neoplasm of unspecified site of unspecified female breast: Secondary | ICD-10-CM

## 2015-03-27 DIAGNOSIS — Z1509 Genetic susceptibility to other malignant neoplasm: Secondary | ICD-10-CM

## 2015-03-27 DIAGNOSIS — F419 Anxiety disorder, unspecified: Secondary | ICD-10-CM

## 2015-03-27 DIAGNOSIS — R112 Nausea with vomiting, unspecified: Secondary | ICD-10-CM

## 2015-03-27 DIAGNOSIS — Z1502 Genetic susceptibility to malignant neoplasm of ovary: Secondary | ICD-10-CM

## 2015-03-27 DIAGNOSIS — Z1589 Genetic susceptibility to other disease: Secondary | ICD-10-CM

## 2015-03-27 DIAGNOSIS — R197 Diarrhea, unspecified: Secondary | ICD-10-CM

## 2015-03-27 LAB — COMPREHENSIVE METABOLIC PANEL (CC13)
ALBUMIN: 3.9 g/dL (ref 3.5–5.0)
ALK PHOS: 84 U/L (ref 40–150)
ALT: 21 U/L (ref 0–55)
ANION GAP: 9 meq/L (ref 3–11)
AST: 14 U/L (ref 5–34)
BILIRUBIN TOTAL: 0.49 mg/dL (ref 0.20–1.20)
BUN: 15.2 mg/dL (ref 7.0–26.0)
CO2: 27 meq/L (ref 22–29)
CREATININE: 0.9 mg/dL (ref 0.6–1.1)
Calcium: 9.8 mg/dL (ref 8.4–10.4)
Chloride: 104 mEq/L (ref 98–109)
EGFR: 85 mL/min/{1.73_m2} — ABNORMAL LOW (ref >90)
GLUCOSE: 95 mg/dL (ref 70–140)
Potassium: 4 mEq/L (ref 3.5–5.1)
Sodium: 140 mEq/L (ref 136–145)
TOTAL PROTEIN: 6 g/dL — AB (ref 6.4–8.3)

## 2015-03-27 LAB — CBC WITH DIFFERENTIAL/PLATELET
BASO%: 0.1 % (ref 0.0–2.0)
Basophils Absolute: 0 10e3/uL (ref 0.0–0.1)
EOS%: 0.3 % (ref 0.0–7.0)
Eosinophils Absolute: 0 10e3/uL (ref 0.0–0.5)
HCT: 25.7 % — ABNORMAL LOW (ref 34.8–46.6)
HGB: 8.6 g/dL — ABNORMAL LOW (ref 11.6–15.9)
LYMPH%: 46.2 % (ref 14.0–49.7)
MCH: 34.5 pg — ABNORMAL HIGH (ref 25.1–34.0)
MCHC: 33.5 g/dL (ref 31.5–36.0)
MCV: 103.2 fL — ABNORMAL HIGH (ref 79.5–101.0)
MONO#: 1.1 10e3/uL — ABNORMAL HIGH (ref 0.1–0.9)
MONO%: 15 % — ABNORMAL HIGH (ref 0.0–14.0)
NEUT#: 2.9 10e3/uL (ref 1.5–6.5)
NEUT%: 38.4 % (ref 38.4–76.8)
Platelets: 151 10e3/uL (ref 145–400)
RBC: 2.49 10e6/uL — ABNORMAL LOW (ref 3.70–5.45)
RDW: 16.3 % — ABNORMAL HIGH (ref 11.2–14.5)
WBC: 7.5 10e3/uL (ref 3.9–10.3)
lymph#: 3.5 10e3/uL — ABNORMAL HIGH (ref 0.9–3.3)

## 2015-03-27 LAB — HOLD TUBE, BLOOD BANK

## 2015-03-27 MED ORDER — SODIUM CHLORIDE 0.9 % IV SOLN
Freq: Once | INTRAVENOUS | Status: AC
  Administered 2015-03-27: 10:00:00 via INTRAVENOUS

## 2015-03-27 MED ORDER — HEPARIN SOD (PORK) LOCK FLUSH 100 UNIT/ML IV SOLN
500.0000 [IU] | Freq: Once | INTRAVENOUS | Status: AC | PRN
  Administered 2015-03-27: 500 [IU]
  Filled 2015-03-27: qty 5

## 2015-03-27 MED ORDER — PALONOSETRON HCL INJECTION 0.25 MG/5ML
INTRAVENOUS | Status: AC
  Filled 2015-03-27: qty 5

## 2015-03-27 MED ORDER — SODIUM CHLORIDE 0.9 % IJ SOLN
10.0000 mL | INTRAMUSCULAR | Status: DC | PRN
  Administered 2015-03-27: 10 mL
  Filled 2015-03-27: qty 10

## 2015-03-27 MED ORDER — PALONOSETRON HCL INJECTION 0.25 MG/5ML
0.2500 mg | Freq: Once | INTRAVENOUS | Status: AC
Start: 2015-03-27 — End: 2015-03-27
  Administered 2015-03-27: 0.25 mg via INTRAVENOUS

## 2015-03-27 NOTE — Progress Notes (Signed)
Patient Care Team: Olga Millers, MD as PCP - General (Obstetrics and Gynecology)  DIAGNOSIS: No matching staging information was found for the patient.  SUMMARY OF ONCOLOGIC HISTORY:   Cancer of central portion of right female breast   11/14/2014 Initial Diagnosis Right breast biopsy 8:30 position: Invasive ductal carcinoma with DCIS, grade 2, ER 0%, PR 0%, HER-2 positive ratio 3.53   11/20/2014 Breast MRI Right breast: 3.7 x 2.8 x 1.7 cm area of non-mass enhancement and retroareolar right breast into the lower outer quadrant and lower inner quadrant extending into the nipple involving anterior two thirds of the breast, no lymph nodes   12/04/2014 -  Neo-Adjuvant Chemotherapy Taxotere, carboplatin, Herceptin, Perjeta 6 cycles followed by Herceptin maintenance for 1 year   12/19/2014 Procedure PALB2 Mutation    CHIEF COMPLIANT: Follow-up of her breast MRI  INTERVAL HISTORY: Robin Miles is a 39 year old with above-mentioned history of right breast HER-2 positive cancer currently completed 6 cycles of TCH Perjeta antisera for breast MRI to discuss the result. She feels nauseated and has abdominal pain. She is due for IV fluids today. She usually feels better after receiving Aloxi. Breast MRI done after neo-adjuvant chemotherapy showed a complete radiologic response.  REVIEW OF SYSTEMS:   Constitutional: Denies fevers, chills or abnormal weight loss Eyes: Denies blurriness of vision Ears, nose, mouth, throat, and face: Denies mucositis or sore throat Respiratory: Denies cough, dyspnea or wheezes Cardiovascular: Denies palpitation, chest discomfort or lower extremity swelling Gastrointestinal:  Nausea and abdominal pain Skin: Denies abnormal skin rashes Lymphatics: Denies new lymphadenopathy or easy bruising Neurological:Denies numbness, tingling or new weaknesses Behavioral/Psych: Mood is stable, no new changes  All other systems were reviewed with the patient and are negative.  I have  reviewed the past medical history, past surgical history, social history and family history with the patient and they are unchanged from previous note.  ALLERGIES:  has No Known Allergies.  MEDICATIONS:  Current Outpatient Prescriptions  Medication Sig Dispense Refill  . ALPRAZolam (XANAX) 0.5 MG tablet Take 0.5 mg by mouth daily as needed for anxiety.     . Alum & Mag Hydroxide-Simeth (MAGIC MOUTHWASH W/LIDOCAINE) SOLN 5-33m  Four times daily as needed for mouth discomfort 240 mL 1  . dexamethasone (DECADRON) 4 MG tablet Take 1 tablet (4 mg total) by mouth 2 (two) times daily. Start the day before Taxotere. Then again the day after chemo for 3 days. 30 tablet 2  . diphenoxylate-atropine (LOMOTIL) 2.5-0.025 MG per tablet Take 1 tablet by mouth 4 (four) times daily as needed for diarrhea or loose stools. 30 tablet 3  . HYDROcodone-acetaminophen (NORCO/VICODIN) 5-325 MG per tablet Take 1-2 tablets by mouth every 4 (four) hours as needed for moderate pain or severe pain. 25 tablet 0  . lidocaine-prilocaine (EMLA) cream Apply to affected area once 30 g 3  . loratadine (CLARITIN) 10 MG tablet Take 10 mg by mouth daily.    .Marland KitchenLORazepam (ATIVAN) 0.5 MG tablet Take 1 tablet (0.5 mg total) by mouth every 6 (six) hours as needed for anxiety or sleep. 30 tablet 2  . Omeprazole Magnesium (PRILOSEC OTC PO) Take 1 tablet by mouth daily.    . ondansetron (ZOFRAN) 8 MG tablet Take 1 tablet (8 mg total) by mouth 2 (two) times daily. Start the day after chemo for 3 days. Then take as needed for nausea or vomiting. 30 tablet 1  . pegfilgrastim (NEULASTA) 6 MG/0.6ML injection Inject 6 mg into the skin once.    .Marland Kitchen  Probiotic Product (PROBIOTIC PO) Take 1 capsule by mouth daily.    . prochlorperazine (COMPAZINE) 10 MG tablet Take 1 tablet (10 mg total) by mouth every 6 (six) hours as needed (Nausea or vomiting). 30 tablet 1  . PROCTOSOL HC 2.5 % rectal cream INSERT 1 GRAM 4 TIMES A DAY BY RECTAL ROUTE AS NEEDED.  1  .  promethazine (PHENERGAN) 25 MG suppository Place 1 suppository (25 mg total) rectally every 6 (six) hours as needed for nausea or vomiting. 12 each 0  . promethazine (PHENERGAN) 25 MG tablet Take 1 tablet (25 mg total) by mouth every 6 (six) hours as needed for nausea or vomiting. 30 tablet 0  . UNABLE TO FIND 1 each by Other route once. Dispense cranial prosthesis per medical necessity for alopecia due to chemotherapy secondary to known breast cancer diagnosis. 1 each 0  . zolpidem (AMBIEN CR) 12.5 MG CR tablet Take 1 tablet (12.5 mg total) by mouth at bedtime as needed for sleep. 30 tablet 1   No current facility-administered medications for this visit.    PHYSICAL EXAMINATION: ECOG PERFORMANCE STATUS: 1 - Symptomatic but completely ambulatory  Filed Vitals:   03/27/15 0850  BP: 100/57  Pulse: 86  Temp: 98.4 F (36.9 C)  Resp: 18   Filed Weights   03/27/15 0850  Weight: 113 lb 11.2 oz (51.574 kg)    GENERAL:alert, no distress and comfortable SKIN: skin color, texture, turgor are normal, no rashes or significant lesions EYES: normal, Conjunctiva are pink and non-injected, sclera clear OROPHARYNX:no exudate, no erythema and lips, buccal mucosa, and tongue normal  NECK: supple, thyroid normal size, non-tender, without nodularity LYMPH:  no palpable lymphadenopathy in the cervical, axillary or inguinal LUNGS: clear to auscultation and percussion with normal breathing effort HEART: regular rate & rhythm and no murmurs and no lower extremity edema ABDOMEN:abdomen soft, non-tender and normal bowel sounds Musculoskeletal:no cyanosis of digits and no clubbing  NEURO: alert & oriented x 3 with fluent speech, no focal motor/sensory deficits  LABORATORY DATA:  I have reviewed the data as listed   Chemistry      Component Value Date/Time   NA 140 03/27/2015 0830   NA 136 01/02/2015 2217   K 4.0 03/27/2015 0830   K 3.8 01/02/2015 2217   CL 101 01/02/2015 2217   CO2 27 03/27/2015  0830   CO2 23 01/02/2015 2217   BUN 15.2 03/27/2015 0830   BUN 12 01/02/2015 2217   CREATININE 0.9 03/27/2015 0830   CREATININE 0.81 01/02/2015 2217      Component Value Date/Time   CALCIUM 9.8 03/27/2015 0830   CALCIUM 9.3 01/02/2015 2217   ALKPHOS 84 03/27/2015 0830   ALKPHOS 87 01/02/2015 2217   AST 14 03/27/2015 0830   AST 16 01/02/2015 2217   ALT 21 03/27/2015 0830   ALT 19 01/02/2015 2217   BILITOT 0.49 03/27/2015 0830   BILITOT 1.5* 01/02/2015 2217       Lab Results  Component Value Date   WBC 7.5 03/27/2015   HGB 8.6* 03/27/2015   HCT 25.7* 03/27/2015   MCV 103.2* 03/27/2015   PLT 151 03/27/2015   NEUTROABS 2.9 03/27/2015   ASSESSMENT & PLAN:  Cancer of central portion of right female breast Right breast retroareolar mass 3.7 x 2.8 x 1.7 cm involving anterior two thirds of the breast, T2 N0 M0 stage II a clinical stage, ER 0%, appears 0%, HER-2 positive ratio 3.53, PALB 2 mutation, completed neo-adjuvant chemotherapy with [facility]  Perjeta started 12/04/2014 and completed 03/20/2015  Breast [id number] right breast malignancy, with resolution of previously seen non mass enhancement involving the retroareolar right breast compatible with treatment response. No new masses or new suspicious areas of malignancy seen in the right breast.  Radiology review: I discussed the breast MRI report and provided her with a copy of this result. Patient has a radiologic complete response to neo-adjuvant chemotherapy.  Recommendation: 1. Breast conserving surgery 2. Followed by adjuvant radiation 3. Continue with Herceptin maintenance for 1 year every 3 weeks   No orders of the defined types were placed in this encounter.   The patient has a good understanding of the overall plan. she agrees with it. she will call with any problems that may develop before the next visit here.   Rulon Eisenmenger, MD

## 2015-03-27 NOTE — Assessment & Plan Note (Signed)
Right breast retroareolar mass 3.7 x 2.8 x 1.7 cm involving anterior two thirds of the breast, T2 N0 M0 stage II a clinical stage, ER 0%, appears 0%, HER-2 positive ratio 3.53, PALB 2 mutation, completed neo-adjuvant chemotherapy with TCH Perjeta started 12/04/2014 and completed 03/20/2015  Breast [id number] right breast malignancy, with resolution of previously seen non mass enhancement involving the retroareolar right breast compatible with treatment response. No new masses or new suspicious areas of malignancy seen in the right breast.  Radiology review: I discussed the breast MRI report and provided her with a copy of this result. Patient has a radiologic complete response to neo-adjuvant chemotherapy.  Recommendation: 1. Breast conserving surgery 2. Followed by adjuvant radiation 3. Continue with Herceptin maintenance for 1 year every 3 weeks

## 2015-03-27 NOTE — Patient Instructions (Signed)
Dehydration, Adult Dehydration is when you lose more fluids from the body than you take in. Vital organs like the kidneys, brain, and heart cannot function without a proper amount of fluids and salt. Any loss of fluids from the body can cause dehydration.  CAUSES   Vomiting.  Diarrhea.  Excessive sweating.  Excessive urine output.  Fever. SYMPTOMS  Mild dehydration  Thirst.  Dry lips.  Slightly dry mouth. Moderate dehydration  Very dry mouth.  Sunken eyes.  Skin does not bounce back quickly when lightly pinched and released.  Dark urine and decreased urine production.  Decreased tear production.  Headache. Severe dehydration  Very dry mouth.  Extreme thirst.  Rapid, weak pulse (more than 100 beats per minute at rest).  Cold hands and feet.  Not able to sweat in spite of heat and temperature.  Rapid breathing.  Blue lips.  Confusion and lethargy.  Difficulty being awakened.  Minimal urine production.  No tears. DIAGNOSIS  Your caregiver will diagnose dehydration based on your symptoms and your exam. Blood and urine tests will help confirm the diagnosis. The diagnostic evaluation should also identify the cause of dehydration. TREATMENT  Treatment of mild or moderate dehydration can often be done at home by increasing the amount of fluids that you drink. It is best to drink small amounts of fluid more often. Drinking too much at one time can make vomiting worse. Refer to the home care instructions below. Severe dehydration needs to be treated at the hospital where you will probably be given intravenous (IV) fluids that contain water and electrolytes. HOME CARE INSTRUCTIONS   Ask your caregiver about specific rehydration instructions.  Drink enough fluids to keep your urine clear or pale yellow.  Drink small amounts frequently if you have nausea and vomiting.  Eat as you normally do.  Avoid:  Foods or drinks high in sugar.  Carbonated  drinks.  Juice.  Extremely hot or cold fluids.  Drinks with caffeine.  Fatty, greasy foods.  Alcohol.  Tobacco.  Overeating.  Gelatin desserts.  Wash your hands well to avoid spreading bacteria and viruses.  Only take over-the-counter or prescription medicines for pain, discomfort, or fever as directed by your caregiver.  Ask your caregiver if you should continue all prescribed and over-the-counter medicines.  Keep all follow-up appointments with your caregiver. SEEK MEDICAL CARE IF:  You have abdominal pain and it increases or stays in one area (localizes).  You have a rash, stiff neck, or severe headache.  You are irritable, sleepy, or difficult to awaken.  You are weak, dizzy, or extremely thirsty. SEEK IMMEDIATE MEDICAL CARE IF:   You are unable to keep fluids down or you get worse despite treatment.  You have frequent episodes of vomiting or diarrhea.  You have blood or green matter (bile) in your vomit.  You have blood in your stool or your stool looks black and tarry.  You have not urinated in 6 to 8 hours, or you have only urinated a small amount of very dark urine.  You have a fever.  You faint. MAKE SURE YOU:   Understand these instructions.  Will watch your condition.  Will get help right away if you are not doing well or get worse. Document Released: 07/06/2005 Document Revised: 09/28/2011 Document Reviewed: 02/23/2011 Exit[employer] Patient Information 2015 [employer]. This information is not intended to replace advice given to you by your health care provider. Make sure you discuss any questions you have with your health care   provider.  

## 2015-03-27 NOTE — Telephone Encounter (Signed)
Gave avs & calendar for September/October. Sent message to confirm MD appointment

## 2015-03-28 ENCOUNTER — Encounter: Payer: Self-pay | Admitting: Genetic Counselor

## 2015-03-29 ENCOUNTER — Other Ambulatory Visit: Payer: Self-pay | Admitting: General Surgery

## 2015-03-29 ENCOUNTER — Other Ambulatory Visit: Payer: BLUE CROSS/BLUE SHIELD

## 2015-03-29 ENCOUNTER — Ambulatory Visit (HOSPITAL_BASED_OUTPATIENT_CLINIC_OR_DEPARTMENT_OTHER): Payer: BLUE CROSS/BLUE SHIELD

## 2015-03-29 VITALS — BP 92/51 | HR 85 | Temp 98.2°F | Resp 20

## 2015-03-29 DIAGNOSIS — R112 Nausea with vomiting, unspecified: Secondary | ICD-10-CM

## 2015-03-29 DIAGNOSIS — E860 Dehydration: Secondary | ICD-10-CM

## 2015-03-29 DIAGNOSIS — C50919 Malignant neoplasm of unspecified site of unspecified female breast: Secondary | ICD-10-CM

## 2015-03-29 DIAGNOSIS — C50111 Malignant neoplasm of central portion of right female breast: Secondary | ICD-10-CM

## 2015-03-29 DIAGNOSIS — Z1589 Genetic susceptibility to other disease: Secondary | ICD-10-CM

## 2015-03-29 DIAGNOSIS — R110 Nausea: Secondary | ICD-10-CM | POA: Diagnosis not present

## 2015-03-29 DIAGNOSIS — R197 Diarrhea, unspecified: Secondary | ICD-10-CM

## 2015-03-29 DIAGNOSIS — Z1509 Genetic susceptibility to other malignant neoplasm: Secondary | ICD-10-CM

## 2015-03-29 DIAGNOSIS — Z803 Family history of malignant neoplasm of breast: Secondary | ICD-10-CM

## 2015-03-29 DIAGNOSIS — Z1502 Genetic susceptibility to malignant neoplasm of ovary: Secondary | ICD-10-CM

## 2015-03-29 DIAGNOSIS — Z1379 Encounter for other screening for genetic and chromosomal anomalies: Secondary | ICD-10-CM

## 2015-03-29 DIAGNOSIS — F419 Anxiety disorder, unspecified: Secondary | ICD-10-CM

## 2015-03-29 MED ORDER — ALTEPLASE 2 MG IJ SOLR
2.0000 mg | Freq: Once | INTRAMUSCULAR | Status: DC | PRN
  Filled 2015-03-29: qty 2

## 2015-03-29 MED ORDER — HEPARIN SOD (PORK) LOCK FLUSH 100 UNIT/ML IV SOLN
500.0000 [IU] | Freq: Once | INTRAVENOUS | Status: AC | PRN
  Administered 2015-03-29: 500 [IU]
  Filled 2015-03-29: qty 5

## 2015-03-29 MED ORDER — PALONOSETRON HCL INJECTION 0.25 MG/5ML
INTRAVENOUS | Status: AC
  Filled 2015-03-29: qty 5

## 2015-03-29 MED ORDER — SODIUM CHLORIDE 0.9 % IJ SOLN
10.0000 mL | INTRAMUSCULAR | Status: DC | PRN
  Administered 2015-03-29: 10 mL
  Filled 2015-03-29: qty 10

## 2015-03-29 MED ORDER — HEPARIN SOD (PORK) LOCK FLUSH 100 UNIT/ML IV SOLN
250.0000 [IU] | Freq: Once | INTRAVENOUS | Status: DC | PRN
  Filled 2015-03-29: qty 5

## 2015-03-29 MED ORDER — PALONOSETRON HCL INJECTION 0.25 MG/5ML
0.2500 mg | Freq: Once | INTRAVENOUS | Status: AC
  Administered 2015-03-29: 0.25 mg via INTRAVENOUS

## 2015-03-29 MED ORDER — SODIUM CHLORIDE 0.9 % IV SOLN
Freq: Once | INTRAVENOUS | Status: AC
  Administered 2015-03-29: 10:00:00 via INTRAVENOUS

## 2015-03-29 NOTE — Patient Instructions (Signed)
Dehydration, Adult Dehydration is when you lose more fluids from the body than you take in. Vital organs like the kidneys, brain, and heart cannot function without a proper amount of fluids and salt. Any loss of fluids from the body can cause dehydration.  CAUSES   Vomiting.  Diarrhea.  Excessive sweating.  Excessive urine output.  Fever. SYMPTOMS  Mild dehydration  Thirst.  Dry lips.  Slightly dry mouth. Moderate dehydration  Very dry mouth.  Sunken eyes.  Skin does not bounce back quickly when lightly pinched and released.  Dark urine and decreased urine production.  Decreased tear production.  Headache. Severe dehydration  Very dry mouth.  Extreme thirst.  Rapid, weak pulse (more than 100 beats per minute at rest).  Cold hands and feet.  Not able to sweat in spite of heat and temperature.  Rapid breathing.  Blue lips.  Confusion and lethargy.  Difficulty being awakened.  Minimal urine production.  No tears. DIAGNOSIS  Your caregiver will diagnose dehydration based on your symptoms and your exam. Blood and urine tests will help confirm the diagnosis. The diagnostic evaluation should also identify the cause of dehydration. TREATMENT  Treatment of mild or moderate dehydration can often be done at home by increasing the amount of fluids that you drink. It is best to drink small amounts of fluid more often. Drinking too much at one time can make vomiting worse. Refer to the home care instructions below. Severe dehydration needs to be treated at the hospital where you will probably be given intravenous (IV) fluids that contain water and electrolytes. HOME CARE INSTRUCTIONS   Ask your caregiver about specific rehydration instructions.  Drink enough fluids to keep your urine clear or pale yellow.  Drink small amounts frequently if you have nausea and vomiting.  Eat as you normally do.  Avoid:  Foods or drinks high in sugar.  Carbonated  drinks.  Juice.  Extremely hot or cold fluids.  Drinks with caffeine.  Fatty, greasy foods.  Alcohol.  Tobacco.  Overeating.  Gelatin desserts.  Wash your hands well to avoid spreading bacteria and viruses.  Only take over-the-counter or prescription medicines for pain, discomfort, or fever as directed by your caregiver.  Ask your caregiver if you should continue all prescribed and over-the-counter medicines.  Keep all follow-up appointments with your caregiver. SEEK MEDICAL CARE IF:  You have abdominal pain and it increases or stays in one area (localizes).  You have a rash, stiff neck, or severe headache.  You are irritable, sleepy, or difficult to awaken.  You are weak, dizzy, or extremely thirsty. SEEK IMMEDIATE MEDICAL CARE IF:   You are unable to keep fluids down or you get worse despite treatment.  You have frequent episodes of vomiting or diarrhea.  You have blood or green matter (bile) in your vomit.  You have blood in your stool or your stool looks black and tarry.  You have not urinated in 6 to 8 hours, or you have only urinated a small amount of very dark urine.  You have a fever.  You faint. MAKE SURE YOU:   Understand these instructions.  Will watch your condition.  Will get help right away if you are not doing well or get worse. Document Released: 07/06/2005 Document Revised: 09/28/2011 Document Reviewed: 02/23/2011 Exit[employer] Patient Information 2015 [employer]. This information is not intended to replace advice given to you by your health care provider. Make sure you discuss any questions you have with your health care   provider.  

## 2015-03-29 NOTE — Progress Notes (Signed)
Patient request to only receive 500 cc of Normal Saline IVFs today.  Post vital signs stable. Patient ambulates with no complaints of dizziness or shortness of breath.  Patient discharged home with husband.

## 2015-04-02 ENCOUNTER — Telehealth: Payer: Self-pay | Admitting: Hematology and Oncology

## 2015-04-02 NOTE — Telephone Encounter (Signed)
Rescheduled lab and chemo fron 9/21 to 9/20 per [place] and per [place] patient is aware   anne

## 2015-04-03 ENCOUNTER — Encounter: Payer: Self-pay | Admitting: *Deleted

## 2015-04-05 ENCOUNTER — Other Ambulatory Visit (HOSPITAL_COMMUNITY): Payer: Self-pay | Admitting: Plastic Surgery

## 2015-04-09 ENCOUNTER — Ambulatory Visit (HOSPITAL_BASED_OUTPATIENT_CLINIC_OR_DEPARTMENT_OTHER): Payer: BLUE CROSS/BLUE SHIELD

## 2015-04-09 ENCOUNTER — Other Ambulatory Visit: Payer: Self-pay | Admitting: Hematology and Oncology

## 2015-04-09 ENCOUNTER — Other Ambulatory Visit (HOSPITAL_BASED_OUTPATIENT_CLINIC_OR_DEPARTMENT_OTHER): Payer: BLUE CROSS/BLUE SHIELD

## 2015-04-09 VITALS — BP 107/63 | HR 89 | Temp 98.6°F | Resp 16

## 2015-04-09 DIAGNOSIS — C50111 Malignant neoplasm of central portion of right female breast: Secondary | ICD-10-CM

## 2015-04-09 DIAGNOSIS — Z5112 Encounter for antineoplastic immunotherapy: Secondary | ICD-10-CM

## 2015-04-09 LAB — CBC WITH DIFFERENTIAL/PLATELET
BASO%: 0.4 % (ref 0.0–2.0)
Basophils Absolute: 0 10*3/uL (ref 0.0–0.1)
EOS%: 0.2 % (ref 0.0–7.0)
Eosinophils Absolute: 0 10*3/uL (ref 0.0–0.5)
HEMATOCRIT: 25.6 % — AB (ref 34.8–46.6)
HGB: 8.2 g/dL — ABNORMAL LOW (ref 11.6–15.9)
LYMPH#: 2.4 10*3/uL (ref 0.9–3.3)
LYMPH%: 46.8 % (ref 14.0–49.7)
MCH: 34 pg (ref 25.1–34.0)
MCHC: 32 g/dL (ref 31.5–36.0)
MCV: 106.2 fL — ABNORMAL HIGH (ref 79.5–101.0)
MONO#: 0.4 10*3/uL (ref 0.1–0.9)
MONO%: 7.2 % (ref 0.0–14.0)
NEUT#: 2.3 10*3/uL (ref 1.5–6.5)
NEUT%: 45.4 % (ref 38.4–76.8)
Platelets: 139 10*3/uL — ABNORMAL LOW (ref 145–400)
RBC: 2.41 10*6/uL — AB (ref 3.70–5.45)
RDW: 16.5 % — ABNORMAL HIGH (ref 11.2–14.5)
WBC: 5 10*3/uL (ref 3.9–10.3)

## 2015-04-09 LAB — COMPREHENSIVE METABOLIC PANEL (CC13)
ALT: 20 U/L (ref 0–55)
AST: 18 U/L (ref 5–34)
Albumin: 3.7 g/dL (ref 3.5–5.0)
Alkaline Phosphatase: 61 U/L (ref 40–150)
Anion Gap: 7 mEq/L (ref 3–11)
BUN: 11.1 mg/dL (ref 7.0–26.0)
CHLORIDE: 110 meq/L — AB (ref 98–109)
CO2: 28 meq/L (ref 22–29)
CREATININE: 0.9 mg/dL (ref 0.6–1.1)
Calcium: 9.2 mg/dL (ref 8.4–10.4)
EGFR: 82 mL/min/{1.73_m2} — ABNORMAL LOW (ref >90)
GLUCOSE: 89 mg/dL (ref 70–140)
POTASSIUM: 4.1 meq/L (ref 3.5–5.1)
SODIUM: 145 meq/L (ref 136–145)
Total Bilirubin: 0.4 mg/dL (ref 0.20–1.20)
Total Protein: 5.8 g/dL — ABNORMAL LOW (ref 6.4–8.3)

## 2015-04-09 MED ORDER — SODIUM CHLORIDE 0.9 % IJ SOLN
10.0000 mL | INTRAMUSCULAR | Status: DC | PRN
  Administered 2015-04-09: 10 mL
  Filled 2015-04-09: qty 10

## 2015-04-09 MED ORDER — DIPHENHYDRAMINE HCL 25 MG PO CAPS
50.0000 mg | ORAL_CAPSULE | Freq: Once | ORAL | Status: AC
  Administered 2015-04-09: 25 mg via ORAL

## 2015-04-09 MED ORDER — SODIUM CHLORIDE 0.9 % IV SOLN
Freq: Once | INTRAVENOUS | Status: AC
  Administered 2015-04-09: 13:00:00 via INTRAVENOUS

## 2015-04-09 MED ORDER — HEPARIN SOD (PORK) LOCK FLUSH 100 UNIT/ML IV SOLN
500.0000 [IU] | Freq: Once | INTRAVENOUS | Status: AC | PRN
  Administered 2015-04-09: 500 [IU]
  Filled 2015-04-09: qty 5

## 2015-04-09 MED ORDER — TRASTUZUMAB CHEMO INJECTION 440 MG
6.0000 mg/kg | Freq: Once | INTRAVENOUS | Status: AC
  Administered 2015-04-09: 315 mg via INTRAVENOUS
  Filled 2015-04-09: qty 15

## 2015-04-09 MED ORDER — ACETAMINOPHEN 325 MG PO TABS
ORAL_TABLET | ORAL | Status: AC
  Filled 2015-04-09: qty 2

## 2015-04-09 MED ORDER — DIPHENHYDRAMINE HCL 25 MG PO CAPS
ORAL_CAPSULE | ORAL | Status: AC
  Filled 2015-04-09: qty 2

## 2015-04-09 MED ORDER — ACETAMINOPHEN 325 MG PO TABS
650.0000 mg | ORAL_TABLET | Freq: Once | ORAL | Status: AC
  Administered 2015-04-09: 650 mg via ORAL

## 2015-04-09 NOTE — Patient Instructions (Signed)
[facility] Discharge Instructions for Patients Receiving Chemotherapy  Today you received the following chemotherapy agents herceptin  To help prevent nausea and vomiting after your treatment, we encourage you to take your nausea medication  If you develop nausea and vomiting that is not controlled by your nausea medication, call the clinic.   BELOW ARE SYMPTOMS THAT SHOULD BE REPORTED IMMEDIATELY:  *FEVER GREATER THAN 100.5 F  *CHILLS WITH OR WITHOUT FEVER  NAUSEA AND VOMITING THAT IS NOT CONTROLLED WITH YOUR NAUSEA MEDICATION  *UNUSUAL SHORTNESS OF BREATH  *UNUSUAL BRUISING OR BLEEDING  TENDERNESS IN MOUTH AND THROAT WITH OR WITHOUT PRESENCE OF ULCERS  *URINARY PROBLEMS  *BOWEL PROBLEMS  UNUSUAL RASH Items with * indicate a potential emergency and should be followed up as soon as possible.  Feel free to call the clinic you have any questions or concerns. The clinic phone number is [phone].  Please show the CHEMO ALERT CARD at check-in to the Emergency Department and triage nurse.   

## 2015-04-10 ENCOUNTER — Other Ambulatory Visit: Payer: BLUE CROSS/BLUE SHIELD

## 2015-04-10 ENCOUNTER — Ambulatory Visit: Payer: BLUE CROSS/BLUE SHIELD

## 2015-04-12 ENCOUNTER — Encounter (HOSPITAL_COMMUNITY)
Admission: RE | Admit: 2015-04-12 | Discharge: 2015-04-12 | Disposition: A | Discharge status: Home / Self Care | Payer: BLUE CROSS/BLUE SHIELD | Source: Ambulatory Visit | Attending: General Surgery | Admitting: General Surgery

## 2015-04-12 ENCOUNTER — Encounter (HOSPITAL_COMMUNITY): Payer: Self-pay

## 2015-04-12 DIAGNOSIS — Z01812 Encounter for preprocedural laboratory examination: Secondary | ICD-10-CM | POA: Diagnosis not present

## 2015-04-12 DIAGNOSIS — Z01818 Encounter for other preprocedural examination: Secondary | ICD-10-CM | POA: Insufficient documentation

## 2015-04-12 DIAGNOSIS — Z0183 Encounter for blood typing: Secondary | ICD-10-CM | POA: Diagnosis not present

## 2015-04-12 DIAGNOSIS — Z79899 Other long term (current) drug therapy: Secondary | ICD-10-CM | POA: Insufficient documentation

## 2015-04-12 DIAGNOSIS — Z9221 Personal history of antineoplastic chemotherapy: Secondary | ICD-10-CM | POA: Diagnosis not present

## 2015-04-12 DIAGNOSIS — C50911 Malignant neoplasm of unspecified site of right female breast: Secondary | ICD-10-CM | POA: Insufficient documentation

## 2015-04-12 HISTORY — DX: Other complications of anesthesia, initial encounter: T88.59XA

## 2015-04-12 HISTORY — DX: Adverse effect of unspecified anesthetic, initial encounter: T41.45XA

## 2015-04-12 LAB — CBC
HCT: 27.4 % — ABNORMAL LOW (ref 36.0–46.0)
Hemoglobin: 9.1 g/dL — ABNORMAL LOW (ref 12.0–15.0)
MCH: 35 pg — ABNORMAL HIGH (ref 26.0–34.0)
MCHC: 33.2 g/dL (ref 30.0–36.0)
MCV: 105.4 fL — ABNORMAL HIGH (ref 78.0–100.0)
Platelets: 166 10*3/uL (ref 150–400)
RBC: 2.6 MIL/uL — ABNORMAL LOW (ref 3.87–5.11)
RDW: 15.7 % — AB (ref 11.5–15.5)
WBC: 5.4 10*3/uL (ref 4.0–10.5)

## 2015-04-12 LAB — TYPE AND SCREEN
ABO/RH(D): A POS
ANTIBODY SCREEN: NEGATIVE

## 2015-04-12 LAB — ABO/RH: ABO/RH(D): A POS

## 2015-04-12 LAB — HCG, SERUM, QUALITATIVE: PREG SERUM: NEGATIVE

## 2015-04-12 NOTE — Progress Notes (Signed)
PCP is Freda Munro  Patient denied having any acute cardiac or pulmonary issues  Patient informed Nurse that she was instructed to stop taking any vitamins, herbal medications, etc 14 days before surgery, and she did stop taking those medications  Nurse noticed that patient had lab work done on 04/09/15 and that the hgb was 8.2. Patient informed Nurse that Dr. Excell Seltzer was aware. Nurse then called Ebony Hail, [place] and informed her of this. Ebony Hail, [place] stated that patient needed to have a T&S, CBC, and HCG done today during PAT visit. Will enter orders. Patient made aware of conversation with Ebony Hail, [place] and patient verbalized understanding.  Patient husband at chair side during PAT appointment

## 2015-04-12 NOTE — Pre-Procedure Instructions (Signed)
Robin Miles  04/12/2015     Your procedure is scheduled on : Wednesday April 17, 2015 at 12:30 PM.  Report to [facility] Admitting at 10:30 A.M.  Call this number if you have problems the morning of surgery: [phone]     Remember:  Do not eat food or drink liquids after midnight.  Take these medicines the morning of surgery with A SIP OF WATER : Lorazepam (Ativan) if needed   Stop taking any aspirin, herbal medications, Ibuprofen, Advil Motrin, Aleve, etc   Do not wear jewelry, make-up or nail polish.  Do not wear lotions, powders, or perfumes.  You may NOT wear deodorant.  Do not shave 48 hours prior to surgery.    Do not bring valuables to the hospital.  [facility] is not responsible for any belongings or valuables.  Contacts, dentures or bridgework may not be worn into surgery.  Leave your suitcase in the car.  After surgery it may be brought to your room.  For patients admitted to the hospital, discharge time will be determined by your treatment team.  Patients discharged the day of surgery will not be allowed to drive home.   Name and phone number of your driver:    Special instructions:  Shower using CHG soap the night before and the morning of your surgery  Please read over the following fact sheets that you were given. Pain Booklet, Coughing and Deep Breathing and Surgical Site Infection Prevention

## 2015-04-15 NOTE — Progress Notes (Signed)
Anesthesia Chart Review: Patient is a 39 year old female scheduled for bilateral total mastectomies and bilateral axillary SN biopsies (Dr. Excell Seltzer) and bilateral breast reconstruction with tissue expanders and possible use of acellular dermal matrix (Dr. Harlow Mares) on 04/17/15.  History includes right breast cancer s/p 6 cycles chemotherapy (Taxotere, carboplatin, Herceptin, Perjeta, completed 03/20/15; to be followed by Herceptin maintenance for one year and adjuvant radiation), non-smoker, T&A, Port-a-cath, right knee reconstruction, tubal ligation '10, delayed awakening after surgery.  PCP is listed as Dr. Freda Munro (OB-GYN). HEM-ONC is DR. Gudena.  Meds include 65 Fe, Ativan, Probiotic. Neulasta SQ on 03/21/15.   03/01/15 Echo: Study Conclusions - Left ventricle: The average longitudinal LV strain is normal at - 21.1% The cavity size was normal. Systolic function was normal. The estimated ejection fraction was in the range of 55% to 60%. Wall motion was normal; there were no regional wall motion abnormalities. - Aortic valve: Thickening and calcification. There was mild regurgitation. - Mitral valve: There was trivial regurgitation. - Atrial septum: There was increased thickness of the septum, consistent with lipomatous hypertrophy.  12/18/14 1V CXR: IMPRESSION: Left chest porta cath placement with no adverse features.  Labs from 04/09/15 and 04/12/15 noted. Cr 0.9. Glucose 89. H/H 9.1/27.4, up from 8.2/25.6. Serum pregnancy was negative. T&S done due to anemia. CBC results routed to Dr. Excell Seltzer, and HGB called to Butch Penny at Dr. Harlow Mares' office.  H/H is increasing. Last chemo treatment was 03/20/15. She is on oral iron. She was not tachycardic at PAT. BP was 103/52. If surgeons feel labs are acceptable for this procedure then I would anticipate that she could proceed as planned.   [facility] Stay [facility]/Anesthesiology Phone [phone] 04/15/2015 9:47 AM

## 2015-04-15 NOTE — H&P (Signed)
History of Present Illness Marland Kitchen T. Hoxworth MD; 03/29/2015 3:44 PM) The patient is a 39 year old female who presents with breast cancer. She is a pre menopausal female initially referred by Dr. Luberta Robertson for evaluation of recently diagnosed carcinoma of the right breast. She initially presented in April 2016 for a screening mamogram revealing a new area of microcalcifications in the right breast. She had had a previous initial negative mammogram at age 58. Subsequent imaging included diagnostic mamogram showing a heterogeneous area of calcifications directly posterior to the nipple and extending laterally measuring 2.8 cm AP by 1.9 cm transverse by 0.6 cm craniocaudal. This was noted to extend immediately deep to the nipple. Stereotactic guided breast biopsy was performed on April 29 with pathology revealing in situ and invasive carcinoma of the breast. She had experienced no breast symptoms specifically lump or pain, nipple discharge or skin changes. She does not have a personal history of any previous breast problems. Subsequently bilateral breast MRI was performed revealing a 3.7 x 2.8 x 1.7 cm area of non-mass-like enhancement in the retroareolar right breast extending into the lower outer quadrant and lower inner quadrant involving the anterior two thirds of the breast. Findings at that time were the following: Tumor size: 3.7 cm Tumor grade: 2, Ki-67 28% Estrogen Receptor: Negative Progesterone Receptor: Negative Her-2 neu: Positive Lymph node status: Negative She completed adjuvant chemotherapy on August 31 with [facility] perjeta and tolerated this with expected side effects. 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 Due to increased risk of subsequent breast cancer as she has elected bilateral mastectomy. Follow-up MRI was recently performed showing: IMPRESSION: 1. Known right breast malignancy, with resolution of previously seen non mass enhancement involving the  retroareolar right breast compatible with treatment response. No new masses or new suspicious areas of malignancy seen in the right breast. 2. No MRI evidence of left breast malignancy.   Allergies Elbert Ewings, [place]; 03/29/2015 3:09 PM) No Known Drug Allergies05/12/2014  Medication History Elbert Ewings, CMA; 03/29/2015 3:13 PM) Medications Reconciled Ativan (0.5MG  Tablet, Oral) Active. No Current Medications (Taken starting 03/29/2015) PriLOSEC (10MG  Packet, Oral) Active. Probiotic Product (Oral) Active.  Vitals Elbert Ewings CMA; 03/29/2015 3:09 PM) 03/29/2015 3:09 PM Weight: 115 lb Height: 63in Body Surface Area: 1.52 m Body Mass Index: 20.37 kg/m Temp.: 97.40F(Temporal)  Pulse: 83 (Regular)  BP: 120/68 (Sitting, Left Arm, Standard)    Physical Exam Marland Kitchen T. Hoxworth MD; 03/29/2015 3:56 PM) The physical exam findings are as follows: Note:General: Alert, well-developed and well nourished Caucasian female, in no distress Skin: Warm and dry without rash or infection. HEENT: No palpable masses or thyromegaly. Sclera nonicteric. Lymph nodes: No cervical, supraclavicular, or inguinal nodes palpable. Breasts: A cup bilaterally. No palpable masses in either breast. No skin changes nipple inversion or crusting. No palpable axillary adenopathy. Lungs: Breath sounds clear and equal. No wheezing or increased work of breathing. Cardiovascular: Regular rate and rhythm without murmer. No JVD or edema. Peripheral pulses intact. No carotid bruits. Abdomen: Nondistended. Soft and nontender. No masses palpable. No organomegaly. No palpable hernias. Extremities: No edema or joint swelling or deformity. No chronic venous stasis changes. Neurologic: Alert and fully oriented. Gait normal. No focal weakness. Psychiatric: Normal mood and affect. Thought content appropriate with normal judgement and insight    Assessment & Plan Marland Kitchen T. Hoxworth MD; 03/29/2015 4:02 PM) INVASIVE  DUCTAL CARCINOMA OF BREAST, RIGHT (C50.911) Impression: 39 year old female with a diagnosis of cancer of the  right breast, upper outer and lower outer quadrant. Clinical stage 1B, ER negative, PR negative, HER-2 positive. She has completed neoadjuvant chemotherapy with complete radiologic response. She has tested positive for PALB 2 and has significant increased risk for subsequent breast cancer and has elected bilateral total mastectomy. We discussed options for mastectomy including nipple sparing and skin sparing. Due to apparent involvement on initial MRI of the right nipple she does not want nipple sparing. We discussed that with complete response that I would not rule out nipple sparing but she does not want this and this is very reasonable. I discussed the nature of surgery, expected recovery, risks of bleeding, infection and slight risks of lymphedema. All their questions were answered. Current Plans  Schedule for Surgery Bilateral total mastectomy with bilateral axillary sentinel lymph node biopsy followed by immediate reconstruction Pt Education - CCS Mastectomy HCI

## 2015-04-16 MED ORDER — CEFAZOLIN SODIUM-DEXTROSE 2-3 GM-% IV SOLR
2.0000 g | INTRAVENOUS | Status: AC
  Administered 2015-04-17 (×2): 2 g via INTRAVENOUS
  Filled 2015-04-16: qty 50

## 2015-04-16 MED ORDER — CEFAZOLIN SODIUM-DEXTROSE 2-3 GM-% IV SOLR
2.0000 g | INTRAVENOUS | Status: DC
  Filled 2015-04-16: qty 50

## 2015-04-16 MED ORDER — CHLORHEXIDINE GLUCONATE 4 % EX LIQD: 1.0000 "application " | Freq: Once | CUTANEOUS | Status: DC

## 2015-04-16 MED ORDER — HEPARIN SODIUM (PORCINE) 5000 UNIT/ML IJ SOLN
5000.0000 [IU] | INTRAMUSCULAR | Status: AC
  Administered 2015-04-17: 5000 [IU] via SUBCUTANEOUS
  Filled 2015-04-16: qty 1

## 2015-04-17 ENCOUNTER — Ambulatory Visit (HOSPITAL_COMMUNITY): Payer: BLUE CROSS/BLUE SHIELD | Admitting: Critical Care Medicine

## 2015-04-17 ENCOUNTER — Encounter (HOSPITAL_COMMUNITY): Payer: Self-pay | Admitting: *Deleted

## 2015-04-17 ENCOUNTER — Ambulatory Visit (HOSPITAL_COMMUNITY): Payer: BLUE CROSS/BLUE SHIELD | Admitting: Vascular Surgery

## 2015-04-17 ENCOUNTER — Ambulatory Visit (HOSPITAL_COMMUNITY)
Admission: RE | Admit: 2015-04-17 | Discharge: 2015-04-17 | Disposition: A | Discharge status: Home / Self Care | Payer: BLUE CROSS/BLUE SHIELD | Source: Ambulatory Visit | Attending: General Surgery | Admitting: General Surgery

## 2015-04-17 ENCOUNTER — Inpatient Hospital Stay (HOSPITAL_COMMUNITY)
Admission: AD | Admit: 2015-04-17 | Discharge: 2015-04-21 | DRG: 580 | Disposition: A | Discharge status: Home / Self Care | Payer: BLUE CROSS/BLUE SHIELD | Source: Ambulatory Visit | Attending: Plastic Surgery | Admitting: Plastic Surgery

## 2015-04-17 ENCOUNTER — Encounter (HOSPITAL_COMMUNITY)
Admission: AD | Disposition: A | Discharge status: Home / Self Care | Payer: Self-pay | Source: Ambulatory Visit | Attending: Plastic Surgery

## 2015-04-17 DIAGNOSIS — Z170 Estrogen receptor positive status [ER+]: Secondary | ICD-10-CM

## 2015-04-17 DIAGNOSIS — R509 Fever, unspecified: Secondary | ICD-10-CM

## 2015-04-17 DIAGNOSIS — C50411 Malignant neoplasm of upper-outer quadrant of right female breast: Secondary | ICD-10-CM | POA: Diagnosis present

## 2015-04-17 DIAGNOSIS — J9811 Atelectasis: Secondary | ICD-10-CM | POA: Diagnosis not present

## 2015-04-17 DIAGNOSIS — C50919 Malignant neoplasm of unspecified site of unspecified female breast: Secondary | ICD-10-CM | POA: Diagnosis present

## 2015-04-17 DIAGNOSIS — C50511 Malignant neoplasm of lower-outer quadrant of right female breast: Secondary | ICD-10-CM | POA: Diagnosis present

## 2015-04-17 DIAGNOSIS — Z171 Estrogen receptor negative status [ER-]: Secondary | ICD-10-CM

## 2015-04-17 HISTORY — PX: TOTAL MASTECTOMY: SHX6129

## 2015-04-17 HISTORY — PX: BREAST RECONSTRUCTION WITH PLACEMENT OF TISSUE EXPANDER AND FLEX HD (ACELLULAR HYDRATED DERMIS): SHX6295

## 2015-04-17 SURGERY — MASTECTOMY, SIMPLE
Anesthesia: Regional | Site: Breast | Laterality: Bilateral

## 2015-04-17 MED ORDER — HYDROMORPHONE HCL 1 MG/ML IJ SOLN
0.2500 mg | INTRAMUSCULAR | Status: DC | PRN
  Administered 2015-04-17: 0.5 mg via INTRAVENOUS

## 2015-04-17 MED ORDER — PROPOFOL 10 MG/ML IV BOLUS
INTRAVENOUS | Status: DC | PRN
  Administered 2015-04-17: 100 mg via INTRAVENOUS

## 2015-04-17 MED ORDER — HYDROMORPHONE HCL 1 MG/ML IJ SOLN
INTRAMUSCULAR | Status: AC
  Filled 2015-04-17: qty 1

## 2015-04-17 MED ORDER — CEFAZOLIN SODIUM-DEXTROSE 2-3 GM-% IV SOLR
INTRAVENOUS | Status: AC
  Filled 2015-04-17: qty 50

## 2015-04-17 MED ORDER — NEOSTIGMINE METHYLSULFATE 10 MG/10ML IV SOLN
INTRAVENOUS | Status: AC
  Filled 2015-04-17: qty 2

## 2015-04-17 MED ORDER — FERROUS SULFATE 325 (65 FE) MG PO TABS
325.0000 mg | ORAL_TABLET | ORAL | Status: DC
  Administered 2015-04-19 – 2015-04-21 (×2): 325 mg via ORAL
  Filled 2015-04-17 (×2): qty 1

## 2015-04-17 MED ORDER — SODIUM CHLORIDE 0.9 % IV SOLN
Freq: Once | INTRAVENOUS | Status: AC
  Administered 2015-04-17: 1000 mL
  Filled 2015-04-17: qty 1

## 2015-04-17 MED ORDER — MIDAZOLAM HCL 2 MG/2ML IJ SOLN
1.0000 mg | INTRAMUSCULAR | Status: DC | PRN
  Administered 2015-04-17: 2 mg via INTRAVENOUS

## 2015-04-17 MED ORDER — MIDAZOLAM HCL 2 MG/2ML IJ SOLN
INTRAMUSCULAR | Status: AC
Start: 2015-04-17 — End: 2015-04-17
  Filled 2015-04-17: qty 4

## 2015-04-17 MED ORDER — SCOPOLAMINE 1 MG/3DAYS TD PT72
1.0000 | MEDICATED_PATCH | Freq: Once | TRANSDERMAL | Status: DC
  Administered 2015-04-17: 1.5 mg via TRANSDERMAL
  Filled 2015-04-17: qty 1

## 2015-04-17 MED ORDER — ONDANSETRON HCL 4 MG/2ML IJ SOLN: 4.0000 mg | Freq: Four times a day (QID) | INTRAMUSCULAR | Status: DC | PRN

## 2015-04-17 MED ORDER — LACTATED RINGERS IV SOLN
INTRAVENOUS | Status: DC
Start: 2015-04-17 — End: 2015-04-17
  Administered 2015-04-17 (×3): via INTRAVENOUS

## 2015-04-17 MED ORDER — LIDOCAINE HCL (CARDIAC) 20 MG/ML IV SOLN
INTRAVENOUS | Status: AC
  Filled 2015-04-17: qty 5

## 2015-04-17 MED ORDER — METHYLENE BLUE 1 % INJ SOLN
INTRAMUSCULAR | Status: AC
  Filled 2015-04-17: qty 10

## 2015-04-17 MED ORDER — TECHNETIUM TC 99M SULFUR COLLOID FILTERED
1.0000 | Freq: Once | INTRAVENOUS | Status: AC | PRN
  Administered 2015-04-17: 1 via INTRADERMAL

## 2015-04-17 MED ORDER — CEFAZOLIN SODIUM 1-5 GM-% IV SOLN
1.0000 g | Freq: Four times a day (QID) | INTRAVENOUS | Status: DC
  Administered 2015-04-17 – 2015-04-19 (×7): 1 g via INTRAVENOUS
  Filled 2015-04-17 (×9): qty 50

## 2015-04-17 MED ORDER — MIDAZOLAM HCL 5 MG/5ML IJ SOLN
INTRAMUSCULAR | Status: DC | PRN
  Administered 2015-04-17: 2 mg via INTRAVENOUS

## 2015-04-17 MED ORDER — FENTANYL CITRATE (PF) 250 MCG/5ML IJ SOLN
INTRAMUSCULAR | Status: AC
  Filled 2015-04-17: qty 5

## 2015-04-17 MED ORDER — FENTANYL CITRATE (PF) 100 MCG/2ML IJ SOLN
INTRAMUSCULAR | Status: AC
  Administered 2015-04-17: 50 ug via INTRAVENOUS
  Filled 2015-04-17: qty 2

## 2015-04-17 MED ORDER — FENTANYL CITRATE (PF) 100 MCG/2ML IJ SOLN
50.0000 ug | INTRAMUSCULAR | Status: DC | PRN
  Administered 2015-04-17 (×2): 50 ug via INTRAVENOUS

## 2015-04-17 MED ORDER — MIDAZOLAM HCL 2 MG/2ML IJ SOLN
INTRAMUSCULAR | Status: AC
  Administered 2015-04-17: 2 mg via INTRAVENOUS
  Filled 2015-04-17: qty 2

## 2015-04-17 MED ORDER — DEXTROSE-NACL 5-0.45 % IV SOLN
INTRAVENOUS | Status: DC
  Administered 2015-04-17 – 2015-04-19 (×4): via INTRAVENOUS

## 2015-04-17 MED ORDER — ROCURONIUM BROMIDE 100 MG/10ML IV SOLN
INTRAVENOUS | Status: DC | PRN
  Administered 2015-04-17 (×2): 10 mg via INTRAVENOUS
  Administered 2015-04-17: 30 mg via INTRAVENOUS
  Administered 2015-04-17: 5 mg via INTRAVENOUS
  Administered 2015-04-17: 10 mg via INTRAVENOUS
  Administered 2015-04-17: 20 mg via INTRAVENOUS
  Administered 2015-04-17: 5 mg via INTRAVENOUS
  Administered 2015-04-17: 20 mg via INTRAVENOUS
  Administered 2015-04-17: 10 mg via INTRAVENOUS

## 2015-04-17 MED ORDER — SUCCINYLCHOLINE CHLORIDE 20 MG/ML IJ SOLN
INTRAMUSCULAR | Status: AC
  Filled 2015-04-17: qty 1

## 2015-04-17 MED ORDER — HYDROMORPHONE HCL 1 MG/ML IJ SOLN
0.5000 mg | INTRAMUSCULAR | Status: DC | PRN
  Administered 2015-04-17 – 2015-04-18 (×3): 1 mg via INTRAVENOUS
  Filled 2015-04-17 (×3): qty 1

## 2015-04-17 MED ORDER — SODIUM CHLORIDE 0.9 % IJ SOLN
INTRAMUSCULAR | Status: DC | PRN
  Administered 2015-04-17: 14:00:00

## 2015-04-17 MED ORDER — DOCUSATE SODIUM 100 MG PO CAPS
100.0000 mg | ORAL_CAPSULE | Freq: Every day | ORAL | Status: DC
  Administered 2015-04-18 – 2015-04-21 (×4): 100 mg via ORAL
  Filled 2015-04-17 (×4): qty 1

## 2015-04-17 MED ORDER — PHENYLEPHRINE HCL 10 MG/ML IJ SOLN
10.0000 mg | INTRAVENOUS | Status: DC | PRN
  Administered 2015-04-17: 10 ug/min via INTRAVENOUS

## 2015-04-17 MED ORDER — NEOSTIGMINE METHYLSULFATE 10 MG/10ML IV SOLN
INTRAVENOUS | Status: DC | PRN
  Administered 2015-04-17: 3 mg via INTRAVENOUS

## 2015-04-17 MED ORDER — HYDROMORPHONE HCL 2 MG PO TABS
2.0000 mg | ORAL_TABLET | ORAL | Status: DC | PRN
  Administered 2015-04-18: 2 mg via ORAL
  Administered 2015-04-18 (×2): 4 mg via ORAL
  Administered 2015-04-18: 2 mg via ORAL
  Administered 2015-04-19 – 2015-04-21 (×11): 4 mg via ORAL
  Filled 2015-04-17: qty 1
  Filled 2015-04-17 (×8): qty 2
  Filled 2015-04-17: qty 1
  Filled 2015-04-17 (×5): qty 2

## 2015-04-17 MED ORDER — GLYCOPYRROLATE 0.2 MG/ML IJ SOLN
INTRAMUSCULAR | Status: AC
  Filled 2015-04-17: qty 2

## 2015-04-17 MED ORDER — BUPIVACAINE-EPINEPHRINE (PF) 0.5% -1:200000 IJ SOLN
INTRAMUSCULAR | Status: DC | PRN
  Administered 2015-04-17 (×2): 20 mL

## 2015-04-17 MED ORDER — ONDANSETRON HCL 4 MG/2ML IJ SOLN
INTRAMUSCULAR | Status: DC | PRN
  Administered 2015-04-17: 4 mg via INTRAVENOUS

## 2015-04-17 MED ORDER — ROCURONIUM BROMIDE 50 MG/5ML IV SOLN
INTRAVENOUS | Status: AC
  Filled 2015-04-17: qty 2

## 2015-04-17 MED ORDER — DEXAMETHASONE SODIUM PHOSPHATE 10 MG/ML IJ SOLN
INTRAMUSCULAR | Status: AC
  Filled 2015-04-17: qty 2

## 2015-04-17 MED ORDER — GLYCOPYRROLATE 0.2 MG/ML IJ SOLN
INTRAMUSCULAR | Status: DC | PRN
  Administered 2015-04-17: 0.4 mg via INTRAVENOUS

## 2015-04-17 MED ORDER — DEXAMETHASONE SODIUM PHOSPHATE 10 MG/ML IJ SOLN
INTRAMUSCULAR | Status: DC | PRN
  Administered 2015-04-17: 10 mg via INTRAVENOUS

## 2015-04-17 MED ORDER — LIDOCAINE HCL (CARDIAC) 20 MG/ML IV SOLN
INTRAVENOUS | Status: DC | PRN
  Administered 2015-04-17: 40 mg via INTRAVENOUS

## 2015-04-17 MED ORDER — PROPOFOL 10 MG/ML IV BOLUS
INTRAVENOUS | Status: AC
  Filled 2015-04-17: qty 20

## 2015-04-17 MED ORDER — FENTANYL CITRATE (PF) 100 MCG/2ML IJ SOLN
INTRAMUSCULAR | Status: DC | PRN
  Administered 2015-04-17 (×8): 50 ug via INTRAVENOUS

## 2015-04-17 MED ORDER — SODIUM CHLORIDE 0.9 % IJ SOLN
INTRAMUSCULAR | Status: DC | PRN
  Administered 2015-04-17: 3 mL

## 2015-04-17 MED ORDER — METHOCARBAMOL 500 MG PO TABS
500.0000 mg | ORAL_TABLET | Freq: Four times a day (QID) | ORAL | Status: DC
  Administered 2015-04-17 – 2015-04-18 (×3): 500 mg via ORAL
  Filled 2015-04-17 (×2): qty 1

## 2015-04-17 MED ORDER — 0.9 % SODIUM CHLORIDE (POUR BTL) OPTIME
TOPICAL | Status: DC | PRN
  Administered 2015-04-17: 3000 mL

## 2015-04-17 SURGICAL SUPPLY — 76 items
ADH SKN CLS APL DERMABOND .7 (GAUZE/BANDAGES/DRESSINGS) ×1
APPLIER CLIP 9.375 MED OPEN (MISCELLANEOUS)
APR CLP MED 9.3 20 MLT OPN (MISCELLANEOUS)
ATCH SMKEVC FLXB CAUT HNDSWH (FILTER) ×1 IMPLANT
BAG DECANTER FOR FLEXI CONT (MISCELLANEOUS) ×2 IMPLANT
BINDER BREAST LRG (GAUZE/BANDAGES/DRESSINGS) ×1 IMPLANT
BINDER BREAST XLRG (GAUZE/BANDAGES/DRESSINGS) IMPLANT
BIOPATCH RED 1 DISK 7.0 (GAUZE/BANDAGES/DRESSINGS) ×3 IMPLANT
CANISTER SUCTION 2500CC (MISCELLANEOUS) ×4 IMPLANT
CHLORAPREP W/TINT 26ML (MISCELLANEOUS) ×3 IMPLANT
CLIP APPLIE 9.375 MED OPEN (MISCELLANEOUS) IMPLANT
CLIP TI MEDIUM 6 (CLIP) ×1 IMPLANT
CONT SPEC 4OZ CLIKSEAL STRL BL (MISCELLANEOUS) ×2 IMPLANT
COVER SURGICAL LIGHT HANDLE (MISCELLANEOUS) ×4 IMPLANT
DERMABOND ADVANCED (GAUZE/BANDAGES/DRESSINGS) ×1
DERMABOND ADVANCED .7 DNX12 (GAUZE/BANDAGES/DRESSINGS) ×1 IMPLANT
DEVICE DISSECT PLASMABLAD 3.0S (MISCELLANEOUS) ×1 IMPLANT
DRAIN CHANNEL 19F RND (DRAIN) ×7 IMPLANT
DRAPE CHEST BREAST 15X10 FENES (DRAPES) ×2 IMPLANT
DRAPE ORTHO SPLIT 77X108 STRL (DRAPES) ×4
DRAPE PROXIMA HALF (DRAPES) ×6 IMPLANT
DRAPE SURG 17X23 STRL (DRAPES) ×4 IMPLANT
DRAPE SURG ORHT 6 SPLT 77X108 (DRAPES) ×2 IMPLANT
DRAPE UTILITY XL STRL (DRAPES) ×4 IMPLANT
DRAPE WARM FLUID 44X44 (DRAPE) ×2 IMPLANT
DRESSING TELFA 8X10 (GAUZE/BANDAGES/DRESSINGS) ×1 IMPLANT
DRSG PAD ABDOMINAL 8X10 ST (GAUZE/BANDAGES/DRESSINGS) ×6 IMPLANT
DRSG SORBAVIEW 3.5X5-5/16 MED (GAUZE/BANDAGES/DRESSINGS) ×3 IMPLANT
ELECT BLADE 6.5 EXT (BLADE) IMPLANT
ELECT CAUTERY BLADE 6.4 (BLADE) ×4 IMPLANT
ELECT REM PT RETURN 9FT ADLT (ELECTROSURGICAL) ×4
ELECTRODE REM PT RTRN 9FT ADLT (ELECTROSURGICAL) ×2 IMPLANT
EVACUATOR SILICONE 100CC (DRAIN) ×7 IMPLANT
EVACUATOR SMOKE ACCUVAC VALLEY (FILTER) ×2
GLOVE BIO SURGEON STRL SZ 6 (GLOVE) ×1 IMPLANT
GLOVE BIO SURGEON STRL SZ7.5 (GLOVE) ×2 IMPLANT
GLOVE BIOGEL PI IND STRL 8 (GLOVE) ×2 IMPLANT
GLOVE BIOGEL PI INDICATOR 8 (GLOVE) ×2
GLOVE ECLIPSE 7.5 STRL STRAW (GLOVE) ×2 IMPLANT
GOWN STRL REUS W/ TWL LRG LVL3 (GOWN DISPOSABLE) ×2 IMPLANT
GOWN STRL REUS W/ TWL XL LVL3 (GOWN DISPOSABLE) ×2 IMPLANT
GOWN STRL REUS W/TWL LRG LVL3 (GOWN DISPOSABLE) ×2
GOWN STRL REUS W/TWL XL LVL3 (GOWN DISPOSABLE) ×4
IMPL BREAST TIS EXP M 350CC (Breast) IMPLANT
IMPLANT BREAST TIS EXP M 350CC (Breast) ×4 IMPLANT
KIT BASIN OR (CUSTOM PROCEDURE TRAY) ×4 IMPLANT
KIT ROOM TURNOVER OR (KITS) ×3 IMPLANT
LIGHT WAVEGUIDE WIDE FLAT (MISCELLANEOUS) ×1 IMPLANT
LIQUID BAND (GAUZE/BANDAGES/DRESSINGS) ×2 IMPLANT
MARKER SKIN DUAL TIP RULER LAB (MISCELLANEOUS) ×2 IMPLANT
NDL HYPO 25GX1X1/2 BEV (NEEDLE) IMPLANT
NEEDLE HYPO 25GX1X1/2 BEV (NEEDLE) ×2 IMPLANT
NS IRRIG 1000ML POUR BTL (IV SOLUTION) ×6 IMPLANT
PACK GENERAL/GYN (CUSTOM PROCEDURE TRAY) ×4 IMPLANT
PAD ARMBOARD 7.5X6 YLW CONV (MISCELLANEOUS) ×3 IMPLANT
PENCIL BUTTON HOLSTER BLD 10FT (ELECTRODE) ×1 IMPLANT
PLASMABLADE 3.0S (MISCELLANEOUS) ×2
PREFILTER EVAC NS 1 1/3-3/8IN (MISCELLANEOUS) ×2 IMPLANT
SET ASEPTIC TRANSFER (MISCELLANEOUS) ×1 IMPLANT
SPECIMEN JAR X LARGE (MISCELLANEOUS) ×3 IMPLANT
SPONGE LAP 18X18 X RAY DECT (DISPOSABLE) ×1 IMPLANT
STAPLER VISISTAT 35W (STAPLE) ×1 IMPLANT
SUT ETHILON 2 0 FS 18 (SUTURE) ×2 IMPLANT
SUT MNCRL AB 3-0 PS2 18 (SUTURE) ×6 IMPLANT
SUT MON AB 5-0 PS2 18 (SUTURE) ×2 IMPLANT
SUT PDS AB 3-0 SH 27 (SUTURE) ×2 IMPLANT
SUT PROLENE 3 0 PS 2 (SUTURE) ×4 IMPLANT
SUT VIC AB 3-0 SH 18 (SUTURE) ×5 IMPLANT
SYR BULB IRRIGATION 50ML (SYRINGE) ×2 IMPLANT
SYR CONTROL 10ML LL (SYRINGE) ×1 IMPLANT
TAPE STRIPS DRAPE STRL (GAUZE/BANDAGES/DRESSINGS) ×1 IMPLANT
TOWEL OR 17X24 6PK STRL BLUE (TOWEL DISPOSABLE) ×3 IMPLANT
TOWEL OR 17X26 10 PK STRL BLUE (TOWEL DISPOSABLE) ×3 IMPLANT
TRAY FOLEY CATH 16FR SILVER (SET/KITS/TRAYS/PACK) ×1 IMPLANT
TRAY FOLEY CATH SILVER 16FR (SET/KITS/TRAYS/PACK) ×1 IMPLANT
TUBE CONNECTING 12X1/4 (SUCTIONS) ×5 IMPLANT

## 2015-04-17 NOTE — Op Note (Signed)
Preoperative Diagnosis: RIGHT BREAST CANCER, CANCER GENE  Postoprative Diagnosis: RIGHT BREAST CANCER, CANCER GENE  Procedure: Procedure(s): Blue dye injection right breast, BILATERAL TOTAL MASTECTOMY With right axillary sentinel lymph node biopsy    Surgeon: Excell Seltzer T   Assistants: None  Anesthesia:  General LMA anesthesia  Indications: Patient is a 39 year old female with a recent diagnosis of invasive ductal carcinoma of the right breast approximately 4 cm fairly extensive throughout the central breast to just behind the right nipple. She also is positive for the PALB2 gene. She has undergone neoadjuvant chemotherapy with very good clinical response and resolution of the tumor on MRI. After  extensive discussion Regarding surgical treatment options he tells elsewhere we elected to proceed with bilateral total mastectomy with right axillary sentinel lymph node biopsy. We discussed the indications for an nature of the procedure and risks extensively detailed elsewhere.    Procedure Detail:  Preoperatively the patient underwent injection of 1 mCi Of technetium sulfur colloid intradermally around the right nipple in the holding area. She was taken to the operating room, placed in the supine position on the operating table and laryngeal mask general anesthesia induced. She was given preoperative IV antibiotics. PAS were in place. The entire anterior chest and neck and breasts, axillae and upper arms were widely sterilely prepped and draped. Patient timeout was performed and correct procedure verified. 5 mL of dilute methylene blue was injected simultaneously beneath the right nipple and massaged for several minutes. The left prophylactic mastectomy was approached first. A curvilinear incision transversely oriented was made encompassing the nipple areolar complex and excising minimal surrounding skin. Skin and subcutaneous flaps were then raised medially to the edge of the sternum,  superiorly to just below the clavicle, inferiorly to the inframammary crease and laterally out to the anterior border of latissimus. The breast was then reflected up off the chest wall. All dissection was done with the plasma blade. Dissection progressed out laterally and the specimen was separated from the serratus muscle and lateral border of the pectoralis major. It was dissected up off the anterior latissimus working up toward the axilla. The specimen was completely freed to the low axilla and we came across this with the plasma blade. The specimen was oriented and sent for permanent pathology. The wound was thoroughly irrigated and complete hemostasis assured. Following this an identical dissection was performed on the right side. As the axilla was exposed I incised the clavipectoral fascia and using the neoprobe for guidance dissected down onto a bright blue node with high counts which was completely excised with cautery. Ex vivo this node had counts of about 1400. A second small soft lymph node without blue dye had elevated counts of about 400 and was excised. Following this there was no palpable abnormality in the axilla, can identify no blue dye and background counts were less than 15. At this point I came across the low axilla with the plasma blade and also clamped and tied with 3-0 Vicryl. This wound was thoroughly irrigated and hemostasis assured. Dr. Harlow Mares and proceeded with reconstruction as planned.    Findings: As above  Estimated Blood Loss:  less than 100 mL         Drains: per Dr. Harlow Mares  Blood Given: none          Specimens: #1 left total mastectomy    #2 right total mastectomy    #3 right axillary sentinel lymph nodes X 2        Complications:  *  No complications entered in OR log *         Disposition: PACU - hemodynamically stable.         Condition: stable

## 2015-04-17 NOTE — Anesthesia Procedure Notes (Addendum)
Anesthesia Regional Block:  Pectoralis block  Pre-Anesthetic Checklist: ,, timeout performed, Correct Patient, Correct Site, Correct Laterality, Correct Procedure, Correct Position, site marked, Risks and benefits discussed, pre-op evaluation, post-op pain management  Laterality: Left and Right  Prep: Maximum Sterile Barrier Precautions used and chloraprep       Needles:  Injection technique: Single-shot  Needle Type: Echogenic Stimulator Needle     Needle Length: 9cm 9 cm Needle Gauge: 21 and 21 G    Additional Needles:  Procedures: ultrasound guided (picture in chart) Pectoralis block Narrative:  Start time: 04/17/2015 11:50 AM End time: 04/17/2015 12:05 PM Injection made incrementally with aspirations every 5 mL. Anesthesiologist: Roderic Palau  Additional Notes: 2% Lidocaine skin wheel. Bilateral PECS blocks   Procedure Name: Intubation Date/Time: 04/17/2015 1:05 PM Performed by: Merrilyn Puma B Pre-anesthesia Checklist: Patient identified, Timeout performed, Emergency Drugs available, Suction available and Patient being monitored Patient Re-evaluated:Patient Re-evaluated prior to inductionOxygen Delivery Method: Circle system utilized Preoxygenation: Pre-oxygenation with 100% oxygen Intubation Type: IV induction Ventilation: Mask ventilation without difficulty Laryngoscope Size: Mac and 3 Grade View: Grade II Tube type: Oral Tube size: 7.0 mm Number of attempts: 1 Airway Equipment and Method: Stylet Placement Confirmation: CO2 detector,  ETT inserted through vocal cords under direct vision,  positive ETCO2 and breath sounds checked- equal and bilateral Secured at: 21 cm Tube secured with: Tape Dental Injury: Teeth and Oropharynx as per pre-operative assessment

## 2015-04-17 NOTE — Anesthesia Preprocedure Evaluation (Addendum)
Anesthesia Evaluation  Patient identified by MRN, date of birth, ID band Patient awake    Reviewed: Allergy & Precautions, H&P , NPO status , Patient's Chart, lab work & pertinent test results  History of Anesthesia Complications (+) PONVNegative for: history of anesthetic complications  Airway Mallampati: II  TM Distance: >3 FB Neck ROM: Full    Dental no notable dental hx. (+) Teeth Intact, Dental Advisory Given   Pulmonary neg pulmonary ROS,    Pulmonary exam normal breath sounds clear to auscultation       Cardiovascular negative cardio ROS   Rhythm:Regular Rate:Normal     Neuro/Psych negative neurological ROS  negative psych ROS   GI/Hepatic negative GI ROS, Neg liver ROS,   Endo/Other  negative endocrine ROS  Renal/GU negative Renal ROS  negative genitourinary   Musculoskeletal   Abdominal   Peds  Hematology negative hematology ROS (+)   Anesthesia Other Findings   Reproductive/Obstetrics negative OB ROS                           Anesthesia Physical Anesthesia Plan  ASA: II  Anesthesia Plan: General and Regional   Post-op Pain Management: GA combined w/ Regional for post-op pain   Induction: Intravenous  Airway Management Planned: Oral ETT  Additional Equipment:   Intra-op Plan:   Post-operative Plan: Extubation in OR  Informed Consent: I have reviewed the patients History and Physical, chart, labs and discussed the procedure including the risks, benefits and alternatives for the proposed anesthesia with the patient or authorized representative who has indicated his/her understanding and acceptance.   Dental advisory given  Plan Discussed with: CRNA  Anesthesia Plan Comments:        Anesthesia Quick Evaluation

## 2015-04-17 NOTE — Interval H&P Note (Signed)
History and Physical Interval Note:  04/17/2015 12:53 PM  Robin Miles  has presented today for surgery, with the diagnosis of RIGHT BREAST CANCER, CANCER GENE  The various methods of treatment have been discussed with the patient and family. After consideration of risks, benefits and other options for treatment, the patient has consented to  Procedure(s): BILATERAL TOTAL MASTECTOMY (Bilateral) with right axillary SLN Bx BILATERAL BREAST RECONSTRUCTION WITH PLACEMENT OF TISSUE EXPANDER AND POSSIBLE USE OF ACELLULAR  DERMIS MATRIX (Bilateral) as a surgical intervention .  The patient's history has been reviewed, patient examined, no change in status, stable for surgery.  I have reviewed the patient's chart and labs.  Questions were answered to the patient's satisfaction.     HOXWORTH,BENJAMIN T

## 2015-04-17 NOTE — Brief Op Note (Signed)
04/17/2015  6:04 PM  PATIENT:  Robin Miles  39 y.o. female  PRE-OPERATIVE DIAGNOSIS:  RIGHT BREAST CANCER, CANCER GENE  POST-OPERATIVE DIAGNOSIS:  RIGHT BREAST CANCER, CANCER GENE  PROCEDURE:  Procedure(s): BILATERAL TOTAL MASTECTOMY (Bilateral) BILATERAL BREAST RECONSTRUCTION WITH PLACEMENT OF TISSUE EXPANDERS (Bilateral)  SURGEON:  Surgeon(s) and Role: Panel 1:    * Excell Seltzer, MD - Primary  Panel 2:    * Crissie Reese, MD - Primary  PHYSICIAN ASSISTANT:   ASSISTANTS: none   ANESTHESIA:   general  EBL:  Total I/O In: 2200 [I.V.:2200] Out: 730 [Urine:480; Blood:250]  BLOOD ADMINISTERED:none  DRAINS: (4) Jackson-Pratt drain(s) with closed bulb suction in the left chest (2) and right chest (2)   LOCAL MEDICATIONS USED:  NONE  SPECIMEN:  No Specimen  DISPOSITION OF SPECIMEN:  N/A  COUNTS:  YES  TOURNIQUET:  * No tourniquets in log *  DICTATION: .Other Dictation: Dictation Number [id number]  PLAN OF CARE: Admit to inpatient   PATIENT DISPOSITION:  PACU - hemodynamically stable.   Delay start of Pharmacological VTE agent (>24hrs) due to surgical blood loss or risk of bleeding: yes

## 2015-04-17 NOTE — Progress Notes (Signed)
Patient currently ready for injections from nuclear med., notified nuclear med., spoke to [facility] and found that patient was not scheduled.  Patient scheduled at this time and will receive injections shortly.  @1211  - Clarified with Dr. Excell Seltzer about patient consent for bilateral sentinel node biopsy, Dr. Excell Seltzer confirmed bilateral injections to be done by [facility].  @1215 - [facility] arrived to do injections, but only had enough for one breast.  Larene Beach informed patient and myself that it can take a little over an hour to received injections from outside facility.  @1221 - Notified Dr. Excell Seltzer about the possible delay in order to do both breast, Dr. Excell Seltzer decided that doing the right breast only would be fine and that he would discuss the change with patient when he arrives.  @1223 - Notified nuclear med about the change from bilateral breast to right breast only per MD.  @1229 - Modified consent for sentinel node biopsy to right breat only, patient aware, husband signed for patient due to patient receiving pain/ sedation medication.

## 2015-04-17 NOTE — Transfer of Care (Signed)
Immediate Anesthesia Transfer of Care Note  Patient: Robin Miles  Procedure(s) Performed: Procedure(s): BILATERAL TOTAL MASTECTOMY (Bilateral) BILATERAL BREAST RECONSTRUCTION WITH PLACEMENT OF TISSUE EXPANDERS (Bilateral)  Patient Location: PACU  Anesthesia Type:General and Regional  Level of Consciousness: awake, alert  and oriented  Airway & Oxygen Therapy: Patient Spontanous Breathing and Patient connected to nasal cannula oxygen  Post-op Assessment: Report given to RN, Post -op Vital signs reviewed and stable and Patient moving all extremities X 4  Post vital signs: Reviewed and stable  Last Vitals:  Filed Vitals:   04/17/15 1250  BP: 105/52  Pulse: 85  Temp:   Resp: 17    Complications: No apparent anesthesia complications

## 2015-04-18 ENCOUNTER — Encounter (HOSPITAL_COMMUNITY): Payer: Self-pay | Admitting: General Surgery

## 2015-04-18 LAB — HEMOGLOBIN AND HEMATOCRIT, BLOOD
HEMATOCRIT: 22.7 % — AB (ref 36.0–46.0)
HEMOGLOBIN: 7.1 g/dL — AB (ref 12.0–15.0)

## 2015-04-18 MED ORDER — DIPHENHYDRAMINE HCL 50 MG/ML IJ SOLN
25.0000 mg | INTRAMUSCULAR | Status: DC | PRN
  Administered 2015-04-18: 25 mg via INTRAVENOUS
  Filled 2015-04-18 (×2): qty 1

## 2015-04-18 MED ORDER — DIPHENHYDRAMINE HCL 50 MG/ML IJ SOLN
INTRAMUSCULAR | Status: AC
  Filled 2015-04-18: qty 1

## 2015-04-18 MED ORDER — METHOCARBAMOL 500 MG PO TABS
500.0000 mg | ORAL_TABLET | Freq: Four times a day (QID) | ORAL | Status: DC
Start: 2015-04-19 — End: 2015-04-21
  Administered 2015-04-19 – 2015-04-21 (×11): 500 mg via ORAL
  Filled 2015-04-18 (×11): qty 1

## 2015-04-18 MED ORDER — DIPHENHYDRAMINE HCL 50 MG/ML IJ SOLN: 25.0000 mg | Freq: Four times a day (QID) | INTRAMUSCULAR | Status: DC | PRN

## 2015-04-18 MED ORDER — INFLUENZA VAC SPLIT QUAD 0.5 ML IM SUSY
0.5000 mL | PREFILLED_SYRINGE | INTRAMUSCULAR | Status: DC
  Filled 2015-04-18: qty 0.5

## 2015-04-18 NOTE — Anesthesia Postprocedure Evaluation (Signed)
  Anesthesia Post-op Note  Patient: Robin Miles  Procedure(s) Performed: Procedure(s): BILATERAL TOTAL MASTECTOMY (Bilateral) BILATERAL BREAST RECONSTRUCTION WITH PLACEMENT OF TISSUE EXPANDERS (Bilateral)  Patient Location: PACU  Anesthesia Type:GA combined with regional for post-op pain  Level of Consciousness: awake and alert   Airway and Oxygen Therapy: Patient Spontanous Breathing  Post-op Pain: mild  Post-op Assessment: Post-op Vital signs reviewed              Post-op Vital Signs: Reviewed  Last Vitals:  Filed Vitals:   04/18/15 1820  BP:   Pulse:   Temp: 37.8 C  Resp:     Complications: No apparent anesthesia complications

## 2015-04-18 NOTE — Op Note (Signed)
Robin Miles, Robin Miles                   ACCOUNT NO.:  [id number]  MEDICAL RECORD NO.:  [id number]  LOCATION:  [id number]                        FACILITY:  [facility]  PHYSICIAN:  Crissie Reese, M.D.     DATE OF BIRTH:  09/02/1975  DATE OF PROCEDURE:  04/17/2015 DATE OF DISCHARGE:                              OPERATIVE REPORT   PREOPERATIVE DIAGNOSIS:  Breast cancer with the PALB2 gene.  POSTOPERATIVE DIAGNOSIS:  Breast cancer with the PALB2 gene.  PROCEDURE:  Bilateral breast reconstruction with tissue expander.  SURGEON:  Crissie Reese, M.D.  ANESTHESIA:  General.  ESTIMATED BLOOD LOSS:  50 mL.  DRAINS:  Two 19-French on each side.  CLINICAL NOTE:  A 39 year old woman has the PALB2 gene and also has right breast cancer.  She presents for bilateral mastectomy and reconstruction.  Options were discussed and she selected use of tissue expanders as a planned staged procedure for eventual placement of implants.  The nature of the procedure, risks, and possible complications were discussed including, but not limited to, bleeding, infection, healing problems, scarring, loss of sensation, fluid accumulations, anesthesia-related complications, loss of tissue, loss of skin, loss of the reconstruction, pneumothorax, PE, DVT, failure of device, capsular contracture, displacement of device, wrinkles, contour deformities, contour deformities at the periphery of the reconstruction, chronic pain, and overall disappointment.  There was also risk of asymmetry.  She understood all these and wished to proceed.  DESCRIPTION OF PROCEDURE:  The patient was in the operating room. Bilateral mastectomy had been completed.  The skin flaps were inspected and appeared to have good color and viable.  Thorough irrigation with saline as well as antibiotic solution.  Submuscular spaces were developed with great care taken to avoid damage to underlying chest cavity.  The submuscular spaces included a portion of serratus  anterior at the lateral aspect.  The tissue expander 350 CC, moderate height, [place] was soaked in antibiotic solution.  After thoroughly cleaning gloves, that were prepared.  100 mL sterile saline placed using a closed filling system.  They were returned to the antibiotic solution to soak. The submuscular space was inspected, found to be in excellent condition, excellent hemostasis.  The tissue expander was then positioned and the muscles closed with 3-0 Vicryl simple interrupted sutures.  The fill was continued to 240 CC on the left and 150 CC on the right.  This fill was also performed with sterile saline using a closed filling system.  The mastectomy flaps were irrigated thoroughly underneath and then meticulous hemostasis with electrocautery.  Two 19-French drains were positioned on each side and brought out 1 lateral, 1 medial on each side and secured with suture.  The skin closures with 3-0 Monocryl interrupted inverted deep dermal sutures and a few 3-0 Prolene simple interrupted sutures. Biopatch followed by SorbaView dressing for the drains and sterile Telfa and ABDs for the chest and the chest binder.  She was transferred to the recovery room stable, having tolerated the procedure well.     Crissie Reese, M.D.     DB/MEDQ  D:  04/17/2015  T:  04/18/2015  Job:  [id number]  cc:   Crissie Reese, M.D.'s Office

## 2015-04-18 NOTE — Progress Notes (Signed)
Pt's BP a little on the low side.  BP has been running slightly low based on chart.  Pt asymptomatic, just had some pain medicine and is resting well.  Will continue to monitor closely. Graceann Congress

## 2015-04-18 NOTE — Progress Notes (Signed)
Pt BP still low. Pt c/o pain.  MD made aware of BP and ordered to give the PO dilaudid 2mg  for now.  Graceann Congress

## 2015-04-19 LAB — URINALYSIS, ROUTINE W REFLEX MICROSCOPIC
Bilirubin Urine: NEGATIVE
GLUCOSE, UA: NEGATIVE mg/dL
HGB URINE DIPSTICK: NEGATIVE
Ketones, ur: NEGATIVE mg/dL
Leukocytes, UA: NEGATIVE
Nitrite: NEGATIVE
PH: 6 (ref 5.0–8.0)
Protein, ur: NEGATIVE mg/dL
SPECIFIC GRAVITY, URINE: 1.016 (ref 1.005–1.030)
Urobilinogen, UA: 0.2 mg/dL (ref 0.0–1.0)

## 2015-04-19 LAB — HEMOGLOBIN AND HEMATOCRIT, BLOOD
HCT: 23.6 % — ABNORMAL LOW (ref 36.0–46.0)
Hemoglobin: 7.7 g/dL — ABNORMAL LOW (ref 12.0–15.0)

## 2015-04-19 MED ORDER — HEPARIN SODIUM (PORCINE) 5000 UNIT/ML IJ SOLN
5000.0000 [IU] | Freq: Three times a day (TID) | INTRAMUSCULAR | Status: DC
  Administered 2015-04-19 (×2): 5000 [IU] via SUBCUTANEOUS
  Filled 2015-04-19 (×3): qty 1

## 2015-04-19 MED ORDER — DOXYCYCLINE HYCLATE 100 MG PO TABS
100.0000 mg | ORAL_TABLET | Freq: Two times a day (BID) | ORAL | Status: DC
  Administered 2015-04-19 – 2015-04-21 (×5): 100 mg via ORAL
  Filled 2015-04-19 (×5): qty 1

## 2015-04-19 MED ORDER — ACETAMINOPHEN 325 MG PO TABS
650.0000 mg | ORAL_TABLET | ORAL | Status: DC | PRN
  Administered 2015-04-19 – 2015-04-20 (×5): 650 mg via ORAL
  Filled 2015-04-19 (×5): qty 2

## 2015-04-19 NOTE — Progress Notes (Signed)
Pt developed a fever of 101.4, c/o congested cough.  Encouraged incentive spirometer and deep breathing.  Pt would like to try to get up and walk in a little bit.  Will continue to monitor. Graceann Congress

## 2015-04-19 NOTE — Progress Notes (Signed)
Pt ambulated out in hallway approximately 272ft and tolerated well.  Now working on IS.  Will continue to monitor. Graceann Congress

## 2015-04-19 NOTE — Progress Notes (Signed)
Subjective: Pain control is okay. No nausea.   Objective: Vital signs in last 24 hours: Temp:  [98.5 F (36.9 C)-102.5 F (39.2 C)] 99.6 F (37.6 C) (09/30 0631) Pulse Rate:  [83-110] 110 (09/30 0631) Resp:  [16-20] 17 (09/30 0631) BP: (98-108)/(52-60) 108/54 mmHg (09/30 0631) SpO2:  [98 %-100 %] 99 % (09/30 0631)  Intake/Output from previous day: 09/29 0701 - 09/30 0700 In: 75 [P.O.:580] Out: 752 [Urine:600; Drains:152] Intake/Output this shift:    Operative sites: Mastectomy flaps viable. No evidence of vascular compromise. Tissue expanders are in good position. Drains functioning. Drainage thin. There is no evidence of bleeding or infection either side.   Recent Labs  04/18/15 0550 04/19/15 0321  HGB 7.1* 7.7*  HCT 22.7* 23.6*    Studies/Results: Nm Sentinel Node Inj-no Rpt (breast)  04/17/2015   CLINICAL DATA: cancer right breast and prophylactic left mastectomy   Sulfur colloid was injected intradermally by the nuclear medicine  technologist for breast cancer sentinel node localization.     Assessment/Plan: Hgb has improved. Fever most likely due to atelectasis. Responded immediately to increased use of spirometer.Should stay one more day to watch Hgb closely. Hopefully ambulation will improve today as well. Overall making good progress.   LOS: 2 days    BOWERS, DAVID M 04/19/2015 8:40 AM

## 2015-04-19 NOTE — Progress Notes (Signed)
MD notified of pt's current fever.  Tylenol ordered and to continue to encourage spirometer use. Will continue to monitor. Graceann Congress

## 2015-04-20 ENCOUNTER — Inpatient Hospital Stay (HOSPITAL_COMMUNITY): Payer: BLUE CROSS/BLUE SHIELD

## 2015-04-20 LAB — HEMOGLOBIN AND HEMATOCRIT, BLOOD
HEMATOCRIT: 22.3 % — AB (ref 36.0–46.0)
HEMOGLOBIN: 7.3 g/dL — AB (ref 12.0–15.0)

## 2015-04-20 MED ORDER — BACITRACIN-NEOMYCIN-POLYMYXIN 400-5-5000 EX OINT
TOPICAL_OINTMENT | CUTANEOUS | Status: AC
  Filled 2015-04-20: qty 1

## 2015-04-20 MED ORDER — MUPIROCIN 2 % EX OINT
TOPICAL_OINTMENT | CUTANEOUS | Status: AC
  Administered 2015-04-20: 12:00:00
  Filled 2015-04-20: qty 22

## 2015-04-20 MED ORDER — DIPHENHYDRAMINE HCL 25 MG PO CAPS
25.0000 mg | ORAL_CAPSULE | Freq: Four times a day (QID) | ORAL | Status: DC | PRN
  Administered 2015-04-20: 25 mg via ORAL
  Filled 2015-04-20: qty 1

## 2015-04-20 MED ORDER — ACETAMINOPHEN 325 MG PO TABS: 650.0000 mg | ORAL_TABLET | Freq: Four times a day (QID) | ORAL | Status: DC | PRN

## 2015-04-20 NOTE — Progress Notes (Signed)
Subjective: Feels okay. Ambulating well. Fever 103 last night.  Objective: Vital signs in last 24 hours: Temp:  [98.3 F (36.8 C)-102.3 F (39.1 C)] 98.3 F (36.8 C) (10/01 0932) Pulse Rate:  [88-104] 88 (10/01 0932) Resp:  [16-18] 18 (10/01 0932) BP: (96-115)/(50-67) 107/58 mmHg (10/01 0932) SpO2:  [98 %-100 %] 100 % (10/01 0932)  Intake/Output from previous day: 09/30 0701 - 10/01 0700 In: 680 [P.O.:680] Out: 125 [Drains:125] Intake/Output this shift: Total I/O In: 240 [P.O.:240] Out: -   Operative sites: Mastectomy flaps viable. No evidence of vascular compromise. Tissue expanders in good position. Drains functioning. Drainage thin. There is no evidence of bleeding from the surgical space on either side. There is a slight amount of blood from the skin edges around a drain on the left. No evidence of infection. Chest: Clear to auscultation.   Recent Labs  04/19/15 0321 04/20/15 0544  HGB 7.7* 7.3*  HCT 23.6* 22.3*    Studies/Results: Dg Chest Port 1 View  04/20/2015   CLINICAL DATA:  Fever.  History of breast carcinoma  EXAM: PORTABLE CHEST 1 VIEW  COMPARISON:  Nov 30, 2014  FINDINGS: Port-A-Cath tip is at the cavoatrial junction. No pneumothorax. Lungs are clear. Heart size and pulmonary vascular normal. No adenopathy. Patient has had bilateral mastectomies with tissue expanders present bilaterally. There are drains on each side. No apparent bone lesions.  IMPRESSION: Postoperative change. Port-A-Cath tip at cavoatrial junction. No pneumothorax. Lungs clear.   Electronically Signed   By: Lowella Grip III M.D.   On: 04/20/2015 08:05    Assessment/Plan: CXR negative. Urine negative. Blood culture negative thus far. No evidence of wound infection. Fever most likely from lungs although she is clear to auscultation and on chest x-ray. Will stop heparin due to bleeding at drain site. Follow fever curve one more day. She continues on Doxycycline for prophylaxis with tissue  expanders.   LOS: 3 days    Kamariah Fruchter M 04/20/2015 11:55 AM

## 2015-04-20 NOTE — Progress Notes (Signed)
3 Days Post-Op  Subjective: Stable and alert.  Ambulating in room. Tip 102.3 last night.  98.6 currently with heart rate 88. Pain seems well controlled.  Only complaint is discomfort and drain sites. Drains seem to be functioning well.  Objective: Vital signs in last 24 hours: Temp:  [98.6 F (37 C)-102.3 F (39.1 C)] 98.6 F (37 C) (10/01 0641) Pulse Rate:  [87-104] 88 (10/01 0641) Resp:  [16-18] 17 (10/01 0641) BP: (96-115)/(50-67) 107/59 mmHg (10/01 0641) SpO2:  [98 %-100 %] 100 % (10/01 0641) Last BM Date: 05/17/15  Intake/Output from previous day: 09/30 0701 - 10/01 0700 In: 680 [P.O.:680] Out: 125 [Drains:125] Intake/Output this shift:    General appearance: Alert.  Pleasant.  Cooperative.  Minimal distress. Resp: clear to auscultation on anterior exam.  No rhonchi or wheeze.  Vital capacity greater than 1000 on incentive spirometer. Breasts: normal appearance, no masses or tenderness, Bilateral mastectomy skin flaps look good.  No ischemia.  No hematoma detected.  Lab Results:  Results for orders placed or performed during the hospital encounter of 04/17/15 (from the past 24 hour(s))  Culture, Urine     Status: None (Preliminary result)   Collection Time: 04/19/15  9:51 PM  Result Value Ref Range   Specimen Description URINE, RANDOM    Special Requests NONE    Culture NO GROWTH < 12 HOURS    Report Status PENDING   Urinalysis, Routine w reflex microscopic (not at [facility])     Status: None   Collection Time: 04/19/15  9:51 PM  Result Value Ref Range   Color, Urine YELLOW YELLOW   APPearance CLEAR CLEAR   Specific Gravity, Urine 1.016 1.005 - 1.030   pH 6.0 5.0 - 8.0   Glucose, UA NEGATIVE NEGATIVE mg/dL   Hgb urine dipstick NEGATIVE NEGATIVE   Bilirubin Urine NEGATIVE NEGATIVE   Ketones, ur NEGATIVE NEGATIVE mg/dL   Protein, ur NEGATIVE NEGATIVE mg/dL   Urobilinogen, UA 0.2 0.0 - 1.0 mg/dL   Nitrite NEGATIVE NEGATIVE   Leukocytes, UA NEGATIVE NEGATIVE   Hemoglobin and hematocrit, blood     Status: Abnormal   Collection Time: 04/20/15  5:44 AM  Result Value Ref Range   Hemoglobin 7.3 (L) 12.0 - 15.0 g/dL   HCT 22.3 (L) 36.0 - 46.0 %     Studies/Results: Dg Chest Port 1 View  04/20/2015   CLINICAL DATA:  Fever.  History of breast carcinoma  EXAM: PORTABLE CHEST 1 VIEW  COMPARISON:  Nov 30, 2014  FINDINGS: Port-A-Cath tip is at the cavoatrial junction. No pneumothorax. Lungs are clear. Heart size and pulmonary vascular normal. No adenopathy. Patient has had bilateral mastectomies with tissue expanders present bilaterally. There are drains on each side. No apparent bone lesions.  IMPRESSION: Postoperative change. Port-A-Cath tip at cavoatrial junction. No pneumothorax. Lungs clear.   Electronically Signed   By: Lowella Grip III Miles.D.   On: 04/20/2015 08:05    . docusate sodium  100 mg Oral Daily  . doxycycline  100 mg Oral Q12H  . ferrous sulfate  325 mg Oral QODAY  . heparin subcutaneous  5,000 Units Subcutaneous 3 times per day  . methocarbamol  500 mg Oral 4 times per day     Assessment/Plan: s/p Procedure(s): BILATERAL TOTAL MASTECTOMY BILATERAL BREAST RECONSTRUCTION WITH PLACEMENT OF TISSUE EXPANDERS  POD#1 Bilateral total mastectomy, right axillary sentinel node biopsy, bilateral breast reconstruction with tissue expander. Stable. Wound management, drain management, and discharge per Dr. Crissie Reese  Postop fever.  Suspect atelectasis.  Likely will be self-limited Encouraged incentive spirometry and ambulation  @PROBHOSP @  LOS: 3 days    Robin Miles 04/20/2015  . .prob

## 2015-04-21 LAB — URINE CULTURE: Culture: NO GROWTH

## 2015-04-21 MED ORDER — DOCUSATE SODIUM 100 MG PO CAPS: 100.0000 mg | ORAL_CAPSULE | Freq: Two times a day (BID) | ORAL | Status: DC

## 2015-04-21 MED ORDER — ENOXAPARIN SODIUM 40 MG/0.4ML ~~LOC~~ SOLN
40.0000 mg | SUBCUTANEOUS | Status: DC
  Administered 2015-04-21: 40 mg via SUBCUTANEOUS
  Filled 2015-04-21: qty 0.4

## 2015-04-21 MED ORDER — HYDROMORPHONE HCL 2 MG PO TABS: 2.0000 mg | ORAL_TABLET | ORAL | Status: DC | PRN

## 2015-04-21 MED ORDER — METHOCARBAMOL 500 MG PO TABS: 500.0000 mg | ORAL_TABLET | Freq: Four times a day (QID) | ORAL | Status: DC

## 2015-04-21 MED ORDER — DOXYCYCLINE HYCLATE 100 MG PO TABS: 100.0000 mg | ORAL_TABLET | Freq: Two times a day (BID) | ORAL | Status: DC

## 2015-04-21 MED ORDER — ENOXAPARIN SODIUM 40 MG/0.4ML ~~LOC~~ SOLN: 40.0000 mg | SUBCUTANEOUS | Status: DC

## 2015-04-21 NOTE — Progress Notes (Signed)
Pt discharged home this afternoon. Drain sites redressed as ordered. Mupirocin ointment applied to drain sites before being covered with gauze and ABD pad. Condition stable at time of discharge

## 2015-04-21 NOTE — Progress Notes (Signed)
4 Days Post-Op  Subjective: Doing well.  Fevers have resolved.  She is hoping to go home today. She remains on prophylactic doxycycline Heparin discontinued by Dr. Harlow Mares yesterday because of bleeding and drain site.  No further bleeding.  Objective: Vital signs in last 24 hours: Temp:  [98.6 F (37 C)-98.8 F (37.1 C)] 98.6 F (37 C) (10/02 0503) Pulse Rate:  [95-100] 100 (10/02 0503) Resp:  [18-19] 19 (10/02 0503) BP: (94-122)/(47-65) 94/47 mmHg (10/02 0503) SpO2:  [100 %] 100 % (10/02 0503) Last BM Date: 04/20/15  Intake/Output from previous day: 10/01 0701 - 10/02 0700 In: 940 [P.O.:940] Out: 75 [Drains:75] Intake/Output this shift: Total I/O In: 240 [P.O.:240] Out: -    General appearance: Alert. Pleasant. Cooperative. Minimal distress. Resp: clear to auscultation on anterior exam. No rhonchi or wheeze.  Breasts:Bilateral mastectomy skin flaps look good. No ischemia. No hematoma detected.  No evidence of infection.    Lab Results:  No results found for this or any previous visit (from the past 24 hour(s)).   Studies/Results: No results found.  . docusate sodium  100 mg Oral Daily  . doxycycline  100 mg Oral Q12H  . ferrous sulfate  325 mg Oral QODAY  . methocarbamol  500 mg Oral 4 times per day     Assessment/Plan: s/p Procedure(s): BILATERAL TOTAL MASTECTOMY BILATERAL BREAST RECONSTRUCTION WITH PLACEMENT OF TISSUE EXPANDERS   POD#2 Bilateral total mastectomy, right axillary sentinel node biopsy, bilateral breast reconstruction with tissue expander. Stable. Wound management, drain management, and discharge per Dr. Crissie Reese Probable discharge today She will follow-up with Dr. Excell Seltzer within 2 weeks.  He will discuss pathology report with her.  Postop fever. Suspect atelectasis.  No evidence of infection.  Fever resolved Encouraged incentive spirometry and ambulation   @PROBHOSP @  LOS: 4 days    Kathline Banbury  M 04/21/2015  . .prob

## 2015-04-21 NOTE — Discharge Summary (Signed)
Physician Discharge Summary  Patient ID: Robin Miles MRN: [id number] DOB/AGE: Jun 20, 1976 39 y.o.  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 The patient tolerated the procedures well. Postoperatively, the mastectomy flaps maintained excellent color and capillary refill. The patient was ambulatory and tolerating diet on the first postoperative day. She did have a low Hgb pre-op at 9.1 which stabilized at 7.3. She remained asymptomatic from that and it was decided after extensive discussion with the patient to hold off on transfusion. She also developed high fever with negative work-up and it resolved. It was felt to be due to atelectasis. She asked to be discharged today. She would like to take Lovenox shots at home. She understands bleeding risk. Bleeding from drain site on left has resolved..  Significant Diagnostic Studies: labs: Hgb 7.s. U/A negative. CXR negative. Blood culture negative.  Treatments: antibiotics: Ancef, anticoagulation: heparin and surgery: bilateral mastectomy and tissue expanders  Discharge Exam: Blood pressure 105/63, pulse 104, temperature 98.3 F (36.8 C), temperature source Oral, resp. rate 20, height _0  (1.6 m), weight 117 lb (53.071 kg), last menstrual period 11/18/2014, SpO2 100 %.  Operative sites: Mastectomy flaps viable. Tissue expanders appear to be in good position. Drains functioning. Drainage thin. There is no evidence of bleeding or infection either side.  Disposition: 01-Home or Self Care     Medication List    STOP taking these medications        UNABLE TO FIND      TAKE these medications        docusate sodium 100 MG capsule  Commonly known  as:  COLACE  Take 1 capsule (100 mg total) by mouth 2 (two) times daily.     doxycycline 100 MG tablet  Commonly known as:  VIBRA-TABS  Take 1 tablet (100 mg total) by mouth every 12 (twelve) hours.     enoxaparin 40 MG/0.4ML injection  Commonly known as:  LOVENOX  Inject 0.4 mLs (40 mg total) into the skin daily.     ferrous sulfate 325 (65 FE) MG tablet  Take 325 mg by mouth every other day.     HYDROmorphone 2 MG tablet  Commonly known as:  DILAUDID  Take 1-2 tablets (2-4 mg total) by mouth every 4 (four) hours as needed for moderate pain.     lidocaine-prilocaine cream  Commonly known as:  EMLA  Apply to affected area once     LORazepam 0.5 MG tablet  Commonly known as:  ATIVAN  Take 1 tablet (0.5 mg total) by mouth every 6 (six) hours as needed for anxiety or sleep.     methocarbamol 500 MG tablet  Commonly known as:  ROBAXIN  Take 1 tablet (500 mg total) by mouth every 6 (six) hours.     PROBIOTIC PO  Take 1 capsule by mouth daily.     PROCTOSOL HC 2.5 % rectal cream  Generic drug:  hydrocortisone  INSERT 1 GRAM 4 TIMES A DAY BY RECTAL ROUTE AS NEEDED.         SignedHarlow Mares, Zollie Ellery M 04/21/2015, 12:23 PM

## 2015-04-21 NOTE — Discharge Instructions (Addendum)
No lifting for 6 weeks No vigorous activity for 6 weeks (including outdoor walks) No driving for 4 weeks OK to walk up stairs slowly No raising arms overhead, no walking outdoors, no lifting, no exercising Stay propped up Use incentive spirometer at home every hour while awake No shower while drains are in place Empty drains at least three times a day and record the amounts separately Change drain dressings every third day if instructed to do so by Dr. Harlow Mares  Apply Bacitracin antibiotic ointment to the drain sites  Place gauze dressing over drains  Secure the gauze with tape Take an over-the-counter Probiotic while on antibiotics Take an over-the-counter stool softener (such as Colace) while on pain medication See Dr. Harlow Mares this week as scheduled For questions call [phone] or [phone]

## 2015-04-24 LAB — CULTURE, BLOOD (ROUTINE X 2)
CULTURE: NO GROWTH
CULTURE: NO GROWTH

## 2015-04-30 NOTE — Assessment & Plan Note (Signed)
Right breast retroareolar mass 3.7 x 2.8 x 1.7 cm involving anterior two thirds of the breast, T2 N0 M0 stage II a clinical stage, ER 0%, appears 0%, HER-2 positive ratio 3.53, PALB 2 mutation, completed neo-adjuvant chemotherapy with TCH Perjeta started 12/04/2014 and completed 03/20/2015  Right Mastectomy 04/17/15: Microscopic focus of DCIS 0.1 cm 0/2 LN Neg. No invasive cancer. Path CR;  Left Mastectomy: Neg  Pathology Review: I discussed the path report from mastectomy. She had a Path CR and she is extremely happy to hear that report. She understands that Path CR predicts excellent prognosis.  Recommendation: 1. Herceptin maintenance for 1 year  RTC every 3 weeks for Herceptin and q 6 weeks for clinic follow ups 

## 2015-05-01 ENCOUNTER — Other Ambulatory Visit (HOSPITAL_BASED_OUTPATIENT_CLINIC_OR_DEPARTMENT_OTHER): Payer: BLUE CROSS/BLUE SHIELD

## 2015-05-01 ENCOUNTER — Ambulatory Visit: Payer: BLUE CROSS/BLUE SHIELD

## 2015-05-01 ENCOUNTER — Telehealth: Payer: Self-pay | Admitting: Hematology and Oncology

## 2015-05-01 ENCOUNTER — Ambulatory Visit (HOSPITAL_BASED_OUTPATIENT_CLINIC_OR_DEPARTMENT_OTHER): Payer: BLUE CROSS/BLUE SHIELD

## 2015-05-01 ENCOUNTER — Encounter: Payer: Self-pay | Admitting: Hematology and Oncology

## 2015-05-01 ENCOUNTER — Other Ambulatory Visit: Payer: BLUE CROSS/BLUE SHIELD

## 2015-05-01 ENCOUNTER — Ambulatory Visit (HOSPITAL_BASED_OUTPATIENT_CLINIC_OR_DEPARTMENT_OTHER): Payer: BLUE CROSS/BLUE SHIELD | Admitting: Hematology and Oncology

## 2015-05-01 VITALS — BP 101/63 | HR 79 | Temp 97.8°F | Resp 18 | Ht 63.0 in | Wt 118.7 lb

## 2015-05-01 DIAGNOSIS — C50111 Malignant neoplasm of central portion of right female breast: Secondary | ICD-10-CM

## 2015-05-01 DIAGNOSIS — Z5112 Encounter for antineoplastic immunotherapy: Secondary | ICD-10-CM | POA: Diagnosis not present

## 2015-05-01 LAB — CBC WITH DIFFERENTIAL/PLATELET
BASO%: 0.8 % (ref 0.0–2.0)
BASOS ABS: 0 10*3/uL (ref 0.0–0.1)
EOS ABS: 0.5 10*3/uL (ref 0.0–0.5)
EOS%: 10.6 % — AB (ref 0.0–7.0)
HEMATOCRIT: 29.4 % — AB (ref 34.8–46.6)
HEMOGLOBIN: 9.4 g/dL — AB (ref 11.6–15.9)
LYMPH#: 2.1 10*3/uL (ref 0.9–3.3)
LYMPH%: 41.9 % (ref 14.0–49.7)
MCH: 32.1 pg (ref 25.1–34.0)
MCHC: 32 g/dL (ref 31.5–36.0)
MCV: 100.3 fL (ref 79.5–101.0)
MONO#: 0.3 10*3/uL (ref 0.1–0.9)
MONO%: 6.2 % (ref 0.0–14.0)
NEUT%: 40.5 % (ref 38.4–76.8)
NEUTROS ABS: 2 10*3/uL (ref 1.5–6.5)
Platelets: 277 10*3/uL (ref 145–400)
RBC: 2.93 10*6/uL — ABNORMAL LOW (ref 3.70–5.45)
RDW: 13.5 % (ref 11.2–14.5)
WBC: 5 10*3/uL (ref 3.9–10.3)

## 2015-05-01 LAB — COMPREHENSIVE METABOLIC PANEL (CC13)
ALBUMIN: 3.4 g/dL — AB (ref 3.5–5.0)
ALK PHOS: 102 U/L (ref 40–150)
ALT: 19 U/L (ref 0–55)
AST: 17 U/L (ref 5–34)
Anion Gap: 10 mEq/L (ref 3–11)
BUN: 16.1 mg/dL (ref 7.0–26.0)
CALCIUM: 9.9 mg/dL (ref 8.4–10.4)
CO2: 24 mEq/L (ref 22–29)
Chloride: 108 mEq/L (ref 98–109)
Creatinine: 0.9 mg/dL (ref 0.6–1.1)
EGFR: 85 mL/min/{1.73_m2} — ABNORMAL LOW (ref >90)
GLUCOSE: 105 mg/dL (ref 70–140)
Potassium: 4.1 mEq/L (ref 3.5–5.1)
SODIUM: 143 meq/L (ref 136–145)
TOTAL PROTEIN: 6.1 g/dL — AB (ref 6.4–8.3)

## 2015-05-01 MED ORDER — SODIUM CHLORIDE 0.9 % IJ SOLN
10.0000 mL | INTRAMUSCULAR | Status: DC | PRN
  Administered 2015-05-01: 10 mL
  Filled 2015-05-01: qty 10

## 2015-05-01 MED ORDER — SODIUM CHLORIDE 0.9 % IV SOLN
Freq: Once | INTRAVENOUS | Status: AC
  Administered 2015-05-01: 09:00:00 via INTRAVENOUS

## 2015-05-01 MED ORDER — ACETAMINOPHEN 325 MG PO TABS
650.0000 mg | ORAL_TABLET | Freq: Once | ORAL | Status: AC
  Administered 2015-05-01: 650 mg via ORAL

## 2015-05-01 MED ORDER — DIPHENHYDRAMINE HCL 25 MG PO CAPS
ORAL_CAPSULE | ORAL | Status: AC
  Filled 2015-05-01: qty 1

## 2015-05-01 MED ORDER — HEPARIN SOD (PORK) LOCK FLUSH 100 UNIT/ML IV SOLN
500.0000 [IU] | Freq: Once | INTRAVENOUS | Status: AC | PRN
  Administered 2015-05-01: 500 [IU]
  Filled 2015-05-01: qty 5

## 2015-05-01 MED ORDER — ACETAMINOPHEN 325 MG PO TABS
ORAL_TABLET | ORAL | Status: AC
  Filled 2015-05-01: qty 2

## 2015-05-01 MED ORDER — SODIUM CHLORIDE 0.9 % IV SOLN
6.0000 mg/kg | Freq: Once | INTRAVENOUS | Status: AC
  Administered 2015-05-01: 315 mg via INTRAVENOUS
  Filled 2015-05-01: qty 15

## 2015-05-01 MED ORDER — DIPHENHYDRAMINE HCL 25 MG PO CAPS
25.0000 mg | ORAL_CAPSULE | Freq: Once | ORAL | Status: AC
  Administered 2015-05-01: 25 mg via ORAL

## 2015-05-01 NOTE — Progress Notes (Signed)
Patient Care Team: No Pcp Per Patient as PCP - General (General Practice)  DIAGNOSIS: No matching staging information was found for the patient.  SUMMARY OF ONCOLOGIC HISTORY:   Cancer of central portion of right female breast ([place])   11/14/2014 Initial Diagnosis Right breast biopsy 8:30 position: Invasive ductal carcinoma with DCIS, grade 2, ER 0%, PR 0%, HER-2 positive ratio 3.53   11/20/2014 Breast MRI Right breast: 3.7 x 2.8 x 1.7 cm area of non-mass enhancement and retroareolar right breast into the lower outer quadrant and lower inner quadrant extending into the nipple involving anterior two thirds of the breast, no lymph nodes   12/04/2014 -  Neo-Adjuvant Chemotherapy Taxotere, carboplatin, Herceptin, Perjeta 6 cycles followed by Herceptin maintenance for 1 year   12/19/2014 Procedure PALB2 Mutation   04/17/2015 Surgery Right Mastectomy: Microscopic focus of DCIS 0.1 cm 0/2 LN Neg. No invasive cancer. Path CR;  Left Mastectomy: Neg    CHIEF COMPLIANT: Follow-up after surgery complaining of muscle soreness and pain  INTERVAL HISTORY: Robin Miles is a 39 year old with above-mentioned history of right-sided breast cancer treated with neoadjuvant chemotherapy followed by recent mastectomy. Final pathology revealed no evidence of any invasive breast cancer. There was a small focus of DCIS. She is recovering very well from surgery. Complains a lot of pain and discomfort. The drains have come out. She is here today to receive Herceptin.  REVIEW OF SYSTEMS:   Constitutional: Denies fevers, chills or abnormal weight loss Eyes: Denies blurriness of vision Ears, nose, mouth, throat, and face: Denies mucositis or sore throat Respiratory: Denies cough, dyspnea or wheezes Cardiovascular: Denies palpitation, chest discomfort or lower extremity swelling Gastrointestinal:  Denies nausea, heartburn or change in bowel habits Skin: Denies abnormal skin rashes Lymphatics: Denies new lymphadenopathy or easy  bruising Neurological:Denies numbness, tingling or new weaknesses Behavioral/Psych: Mood is stable, no new changes  Breast: Soreness from recent surgery All other systems were reviewed with the patient and are negative.  I have reviewed the past medical history, past surgical history, social history and family history with the patient and they are unchanged from previous note.  ALLERGIES:  is allergic to compazine.  MEDICATIONS:  Current Outpatient Prescriptions  Medication Sig Dispense Refill  . docusate sodium (COLACE) 100 MG capsule Take 1 capsule (100 mg total) by mouth 2 (two) times daily. 40 capsule 0  . doxycycline (VIBRA-TABS) 100 MG tablet Take 1 tablet (100 mg total) by mouth every 12 (twelve) hours. 30 tablet 0  . enoxaparin (LOVENOX) 40 MG/0.4ML injection Inject 0.4 mLs (40 mg total) into the skin daily. 10 Syringe 0  . ferrous sulfate 325 (65 FE) MG tablet Take 325 mg by mouth every other day.    Marland Kitchen HYDROmorphone (DILAUDID) 2 MG tablet Take 1-2 tablets (2-4 mg total) by mouth every 4 (four) hours as needed for moderate pain. 40 tablet 0  . lidocaine-prilocaine (EMLA) cream Apply to affected area once 30 g 3  . LORazepam (ATIVAN) 0.5 MG tablet Take 1 tablet (0.5 mg total) by mouth every 6 (six) hours as needed for anxiety or sleep. 30 tablet 2  . methocarbamol (ROBAXIN) 500 MG tablet Take 1 tablet (500 mg total) by mouth every 6 (six) hours. 40 tablet 1  . Probiotic Product (PROBIOTIC PO) Take 1 capsule by mouth daily.    Marland Kitchen PROCTOSOL HC 2.5 % rectal cream INSERT 1 GRAM 4 TIMES A DAY BY RECTAL ROUTE AS NEEDED.  1   No current facility-administered medications for this  visit.   Facility-Administered Medications Ordered in Other Visits  Medication Dose Route Frequency Provider Last Rate Last Dose  . heparin lock flush 100 unit/mL  500 Units Intracatheter Once PRN Vinay Gudena, MD      . sodium chloride 0.9 % injection 10 mL  10 mL Intracatheter PRN Vinay Gudena, MD         PHYSICAL EXAMINATION: ECOG PERFORMANCE STATUS: 1 - Symptomatic but completely ambulatory  Filed Vitals:   05/01/15 0830  BP: 101/63  Pulse: 79  Temp: 97.8 F (36.6 C)  Resp: 18   Filed Weights   05/01/15 0830  Weight: 118 lb 11.2 oz (53.842 kg)    GENERAL:alert, no distress and comfortable SKIN: skin color, texture, turgor are normal, no rashes or significant lesions EYES: normal, Conjunctiva are pink and non-injected, sclera clear OROPHARYNX:no exudate, no erythema and lips, buccal mucosa, and tongue normal  NECK: supple, thyroid normal size, non-tender, without nodularity LYMPH:  no palpable lymphadenopathy in the cervical, axillary or inguinal LUNGS: clear to auscultation and percussion with normal breathing effort HEART: regular rate & rhythm and no murmurs and no lower extremity edema ABDOMEN:abdomen soft, non-tender and normal bowel sounds Musculoskeletal:no cyanosis of digits and no clubbing  NEURO: alert & oriented x 3 with fluent speech, no focal motor/sensory deficits  LABORATORY DATA:  I have reviewed the data as listed   Chemistry      Component Value Date/Time   NA 143 05/01/2015 0812   NA 136 01/02/2015 2217   K 4.1 05/01/2015 0812   K 3.8 01/02/2015 2217   CL 101 01/02/2015 2217   CO2 24 05/01/2015 0812   CO2 23 01/02/2015 2217   BUN 16.1 05/01/2015 0812   BUN 12 01/02/2015 2217   CREATININE 0.9 05/01/2015 0812   CREATININE 0.81 01/02/2015 2217      Component Value Date/Time   CALCIUM 9.9 05/01/2015 0812   CALCIUM 9.3 01/02/2015 2217   ALKPHOS 102 05/01/2015 0812   ALKPHOS 87 01/02/2015 2217   AST 17 05/01/2015 0812   AST 16 01/02/2015 2217   ALT 19 05/01/2015 0812   ALT 19 01/02/2015 2217   BILITOT <0.30 05/01/2015 0812   BILITOT 1.5* 01/02/2015 2217       Lab Results  Component Value Date   WBC 5.0 05/01/2015   HGB 9.4* 05/01/2015   HCT 29.4* 05/01/2015   MCV 100.3 05/01/2015   PLT 277 05/01/2015   NEUTROABS 2.0 05/01/2015    ASSESSMENT & PLAN:  Cancer of central portion of right female breast Right breast retroareolar mass 3.7 x 2.8 x 1.7 cm involving anterior two thirds of the breast, T2 N0 M0 stage II a clinical stage, ER 0%, appears 0%, HER-2 positive ratio 3.53, PALB 2 mutation, completed neo-adjuvant chemotherapy with TCH Perjeta started 12/04/2014 and completed 03/20/2015  Right Mastectomy 04/17/15: Microscopic focus of DCIS 0.1 cm 0/2 LN Neg. No invasive cancer. Path CR;  Left Mastectomy: Neg  Pathology Review: I discussed the path report from mastectomy. She had a Path CR and she is extremely happy to hear that report. She understands that Path CR predicts excellent prognosis.  Recommendation: 1. Herceptin maintenance for 1 year  RTC every 3 weeks for Herceptin and q 6 weeks for clinic follow ups   No orders of the defined types were placed in this encounter.   The patient has a good understanding of the overall plan. she agrees with it. she will call with any problems that may develop before   the next visit here.   Gudena, Vinay K, MD      

## 2015-05-01 NOTE — Patient Instructions (Signed)
[facility] Discharge Instructions for Patients Receiving Chemotherapy  Today you received the following chemotherapy agents herceptin  To help prevent nausea and vomiting after your treatment, we encourage you to take your nausea medication  If you develop nausea and vomiting that is not controlled by your nausea medication, call the clinic.   BELOW ARE SYMPTOMS THAT SHOULD BE REPORTED IMMEDIATELY:  *FEVER GREATER THAN 100.5 F  *CHILLS WITH OR WITHOUT FEVER  NAUSEA AND VOMITING THAT IS NOT CONTROLLED WITH YOUR NAUSEA MEDICATION  *UNUSUAL SHORTNESS OF BREATH  *UNUSUAL BRUISING OR BLEEDING  TENDERNESS IN MOUTH AND THROAT WITH OR WITHOUT PRESENCE OF ULCERS  *URINARY PROBLEMS  *BOWEL PROBLEMS  UNUSUAL RASH Items with * indicate a potential emergency and should be followed up as soon as possible.  Feel free to call the clinic you have any questions or concerns. The clinic phone number is [phone].  Please show the CHEMO ALERT CARD at check-in to the Emergency Department and triage nurse.   

## 2015-05-01 NOTE — Telephone Encounter (Signed)
Appointments made per pof and patient will get a new schedule in chemo °

## 2015-05-07 ENCOUNTER — Telehealth: Payer: Self-pay | Admitting: *Deleted

## 2015-05-07 NOTE — Telephone Encounter (Signed)
Robin Miles with [employer] member services through [place] ext: [phone] called requesting return call in reference to this patient.  Needs medical documentation and documentation of receipt.  She is getting billed for treatment medications ordered by Dr. Lindi Adie.  Medical documenatation will help determine if medications are under Medical review.  Call forwarded to [facility] and Man[facility].   Currently receiving herceptin, was on Carboplatin, Herceptin, Perjetta, Taxotere regimen.

## 2015-05-16 ENCOUNTER — Ambulatory Visit: Payer: BLUE CROSS/BLUE SHIELD | Admitting: Physical Therapy

## 2015-05-22 ENCOUNTER — Other Ambulatory Visit (HOSPITAL_BASED_OUTPATIENT_CLINIC_OR_DEPARTMENT_OTHER): Payer: BLUE CROSS/BLUE SHIELD

## 2015-05-22 ENCOUNTER — Telehealth: Payer: Self-pay | Admitting: Genetic Counselor

## 2015-05-22 ENCOUNTER — Ambulatory Visit (HOSPITAL_BASED_OUTPATIENT_CLINIC_OR_DEPARTMENT_OTHER): Payer: BLUE CROSS/BLUE SHIELD

## 2015-05-22 VITALS — BP 109/58 | HR 85 | Temp 98.0°F | Resp 18

## 2015-05-22 DIAGNOSIS — C50111 Malignant neoplasm of central portion of right female breast: Secondary | ICD-10-CM | POA: Diagnosis not present

## 2015-05-22 DIAGNOSIS — Z23 Encounter for immunization: Secondary | ICD-10-CM

## 2015-05-22 DIAGNOSIS — Z5112 Encounter for antineoplastic immunotherapy: Secondary | ICD-10-CM | POA: Diagnosis not present

## 2015-05-22 LAB — CBC WITH DIFFERENTIAL/PLATELET
BASO%: 0.1 % (ref 0.0–2.0)
BASOS ABS: 0 10*3/uL (ref 0.0–0.1)
EOS ABS: 0.9 10*3/uL — AB (ref 0.0–0.5)
EOS%: 11.1 % — ABNORMAL HIGH (ref 0.0–7.0)
HCT: 35.6 % (ref 34.8–46.6)
HEMOGLOBIN: 11.7 g/dL (ref 11.6–15.9)
LYMPH%: 29 % (ref 14.0–49.7)
MCH: 30.7 pg (ref 25.1–34.0)
MCHC: 32.9 g/dL (ref 31.5–36.0)
MCV: 93.4 fL (ref 79.5–101.0)
MONO#: 0.4 10*3/uL (ref 0.1–0.9)
MONO%: 5.2 % (ref 0.0–14.0)
NEUT#: 4.3 10*3/uL (ref 1.5–6.5)
NEUT%: 54.6 % (ref 38.4–76.8)
Platelets: 241 10*3/uL (ref 145–400)
RBC: 3.81 10*6/uL (ref 3.70–5.45)
RDW: 14 % (ref 11.2–14.5)
WBC: 7.8 10*3/uL (ref 3.9–10.3)
lymph#: 2.3 10*3/uL (ref 0.9–3.3)

## 2015-05-22 LAB — COMPREHENSIVE METABOLIC PANEL (CC13)
ALBUMIN: 4.2 g/dL (ref 3.5–5.0)
ALK PHOS: 78 U/L (ref 40–150)
ALT: 32 U/L (ref 0–55)
AST: 28 U/L (ref 5–34)
Anion Gap: 10 mEq/L (ref 3–11)
BUN: 28.8 mg/dL — ABNORMAL HIGH (ref 7.0–26.0)
CALCIUM: 10.3 mg/dL (ref 8.4–10.4)
CO2: 21 mEq/L — ABNORMAL LOW (ref 22–29)
Chloride: 109 mEq/L (ref 98–109)
Creatinine: 0.9 mg/dL (ref 0.6–1.1)
EGFR: 80 mL/min/{1.73_m2} — AB (ref >90)
GLUCOSE: 118 mg/dL (ref 70–140)
POTASSIUM: 4 meq/L (ref 3.5–5.1)
SODIUM: 141 meq/L (ref 136–145)
Total Bilirubin: 0.39 mg/dL (ref 0.20–1.20)
Total Protein: 6.8 g/dL (ref 6.4–8.3)

## 2015-05-22 MED ORDER — ACETAMINOPHEN 325 MG PO TABS
650.0000 mg | ORAL_TABLET | Freq: Once | ORAL | Status: AC
  Administered 2015-05-22: 650 mg via ORAL

## 2015-05-22 MED ORDER — DIPHENHYDRAMINE HCL 25 MG PO CAPS
ORAL_CAPSULE | ORAL | Status: AC
  Filled 2015-05-22: qty 1

## 2015-05-22 MED ORDER — HEPARIN SOD (PORK) LOCK FLUSH 100 UNIT/ML IV SOLN
500.0000 [IU] | Freq: Once | INTRAVENOUS | Status: AC | PRN
  Administered 2015-05-22: 500 [IU]
  Filled 2015-05-22: qty 5

## 2015-05-22 MED ORDER — INFLUENZA VAC SPLIT QUAD 0.5 ML IM SUSY
0.5000 mL | PREFILLED_SYRINGE | Freq: Once | INTRAMUSCULAR | Status: AC
  Administered 2015-05-22: 0.5 mL via INTRAMUSCULAR
  Filled 2015-05-22: qty 0.5

## 2015-05-22 MED ORDER — DIPHENHYDRAMINE HCL 25 MG PO CAPS
25.0000 mg | ORAL_CAPSULE | Freq: Once | ORAL | Status: DC
Start: 2015-05-22 — End: 2015-05-22

## 2015-05-22 MED ORDER — SODIUM CHLORIDE 0.9 % IJ SOLN
10.0000 mL | INTRAMUSCULAR | Status: DC | PRN
  Administered 2015-05-22: 10 mL
  Filled 2015-05-22: qty 10

## 2015-05-22 MED ORDER — SODIUM CHLORIDE 0.9 % IV SOLN
6.0000 mg/kg | Freq: Once | INTRAVENOUS | Status: AC
  Administered 2015-05-22: 315 mg via INTRAVENOUS
  Filled 2015-05-22: qty 15

## 2015-05-22 MED ORDER — ACETAMINOPHEN 325 MG PO TABS
ORAL_TABLET | ORAL | Status: AC
  Filled 2015-05-22: qty 2

## 2015-05-22 MED ORDER — SODIUM CHLORIDE 0.9 % IV SOLN
Freq: Once | INTRAVENOUS | Status: AC
  Administered 2015-05-22: 10:00:00 via INTRAVENOUS

## 2015-05-22 NOTE — Progress Notes (Signed)
Pt refused Benadryl PO 25mg  stating it makes her drowsy. Dr. Lindi Adie aware and no new orders at this time.

## 2015-05-22 NOTE — Telephone Encounter (Signed)
Gave information for 11/7 interest meeting.  Robin Miles will likely not be able to come that day, but is very interested in receiving information on future group meetings.  Discussed an article that she read about PALB2 and BRCA1 relationship.  I let her know that the NCCN guidelines have been updated recently and are still not quoting a sufficient amount of evidence for ovarian or other cancer risks beyond breast cancer for PALB2 mutations, which is still much different than what we know about for BRCA1 mutations.  Robin Miles would still like to have her ovaries out consider her history of ovarian cysts.  She is going to have this conversation with her doctor.

## 2015-05-22 NOTE — Patient Instructions (Signed)
Cancer Center Discharge Instructions for Patients  Today you received the following: Herceptin   To help prevent nausea and vomiting after your treatment, we encourage you to take your nausea medication as directed.   If you develop nausea and vomiting that is not controlled by your nausea medication, call the clinic.   BELOW ARE SYMPTOMS THAT SHOULD BE REPORTED IMMEDIATELY:  *FEVER GREATER THAN 100.5 F  *CHILLS WITH OR WITHOUT FEVER  NAUSEA AND VOMITING THAT IS NOT CONTROLLED WITH YOUR NAUSEA MEDICATION  *UNUSUAL SHORTNESS OF BREATH  *UNUSUAL BRUISING OR BLEEDING  TENDERNESS IN MOUTH AND THROAT WITH OR WITHOUT PRESENCE OF ULCERS  *URINARY PROBLEMS  *BOWEL PROBLEMS  UNUSUAL RASH Items with * indicate a potential emergency and should be followed up as soon as possible.  Feel free to call the clinic you have any questions or concerns. The clinic phone number is [phone].  Please show the CHEMO ALERT CARD at check-in to the Emergency Department and triage nurse.   

## 2015-05-23 ENCOUNTER — Ambulatory Visit: Payer: BLUE CROSS/BLUE SHIELD | Admitting: Physical Therapy

## 2015-05-30 ENCOUNTER — Other Ambulatory Visit: Payer: Self-pay | Admitting: Obstetrics and Gynecology

## 2015-06-10 ENCOUNTER — Other Ambulatory Visit: Payer: Self-pay | Admitting: *Deleted

## 2015-06-10 DIAGNOSIS — C50111 Malignant neoplasm of central portion of right female breast: Secondary | ICD-10-CM

## 2015-06-11 NOTE — Assessment & Plan Note (Signed)
Right breast retroareolar mass 3.7 x 2.8 x 1.7 cm involving anterior two thirds of the breast, T2 N0 M0 stage II a clinical stage, ER 0%, appears 0%, HER-2 positive ratio 3.53, PALB 2 mutation, completed neo-adjuvant chemotherapy with [place] started 12/04/2014 and completed 03/20/2015  Right Mastectomy 04/17/15: Microscopic focus of DCIS 0.1 cm 0/2 LN Neg. No invasive cancer. Path CR; Left Mastectomy: Neg   Recommendation: Continue Herceptin maintenance for 1 year  RTC every 3 weeks for Herceptin and q 6 weeks for clinic follow ups

## 2015-06-12 ENCOUNTER — Other Ambulatory Visit: Payer: Self-pay | Admitting: *Deleted

## 2015-06-12 ENCOUNTER — Ambulatory Visit (HOSPITAL_BASED_OUTPATIENT_CLINIC_OR_DEPARTMENT_OTHER): Payer: BLUE CROSS/BLUE SHIELD | Admitting: Hematology and Oncology

## 2015-06-12 ENCOUNTER — Other Ambulatory Visit (HOSPITAL_COMMUNITY)
Admission: AD | Admit: 2015-06-12 | Discharge: 2015-06-12 | Disposition: A | Discharge status: Home / Self Care | Payer: BLUE CROSS/BLUE SHIELD | Source: Ambulatory Visit | Attending: Hematology and Oncology | Admitting: Hematology and Oncology

## 2015-06-12 ENCOUNTER — Encounter: Payer: Self-pay | Admitting: Hematology and Oncology

## 2015-06-12 ENCOUNTER — Ambulatory Visit (HOSPITAL_BASED_OUTPATIENT_CLINIC_OR_DEPARTMENT_OTHER): Payer: BLUE CROSS/BLUE SHIELD

## 2015-06-12 ENCOUNTER — Encounter: Payer: Self-pay | Admitting: *Deleted

## 2015-06-12 ENCOUNTER — Telehealth: Payer: Self-pay | Admitting: Hematology and Oncology

## 2015-06-12 ENCOUNTER — Other Ambulatory Visit (HOSPITAL_BASED_OUTPATIENT_CLINIC_OR_DEPARTMENT_OTHER): Payer: BLUE CROSS/BLUE SHIELD

## 2015-06-12 VITALS — BP 111/65 | HR 71 | Temp 98.8°F | Resp 18 | Ht 63.0 in | Wt 116.3 lb

## 2015-06-12 DIAGNOSIS — C50111 Malignant neoplasm of central portion of right female breast: Secondary | ICD-10-CM

## 2015-06-12 DIAGNOSIS — Z5112 Encounter for antineoplastic immunotherapy: Secondary | ICD-10-CM | POA: Diagnosis not present

## 2015-06-12 DIAGNOSIS — C50911 Malignant neoplasm of unspecified site of right female breast: Secondary | ICD-10-CM | POA: Insufficient documentation

## 2015-06-12 LAB — CBC WITH DIFFERENTIAL/PLATELET
BASO%: 1.2 % (ref 0.0–2.0)
Basophils Absolute: 0.1 10*3/uL (ref 0.0–0.1)
EOS ABS: 0.3 10*3/uL (ref 0.0–0.5)
EOS%: 6.9 % (ref 0.0–7.0)
HCT: 36 % (ref 34.8–46.6)
HEMOGLOBIN: 11.9 g/dL (ref 11.6–15.9)
LYMPH%: 52 % — AB (ref 14.0–49.7)
MCH: 29.3 pg (ref 25.1–34.0)
MCHC: 33.1 g/dL (ref 31.5–36.0)
MCV: 88.6 fL (ref 79.5–101.0)
MONO#: 0.3 10*3/uL (ref 0.1–0.9)
MONO%: 5.5 % (ref 0.0–14.0)
NEUT%: 34.4 % — ABNORMAL LOW (ref 38.4–76.8)
NEUTROS ABS: 1.6 10*3/uL (ref 1.5–6.5)
Platelets: 230 10*3/uL (ref 145–400)
RBC: 4.06 10*6/uL (ref 3.70–5.45)
RDW: 13.8 % (ref 11.2–14.5)
WBC: 4.6 10*3/uL (ref 3.9–10.3)
lymph#: 2.4 10*3/uL (ref 0.9–3.3)

## 2015-06-12 LAB — COMPREHENSIVE METABOLIC PANEL (CC13)
ALBUMIN: 4.1 g/dL (ref 3.5–5.0)
ALK PHOS: 80 U/L (ref 40–150)
ALT: 12 U/L (ref 0–55)
AST: 16 U/L (ref 5–34)
Anion Gap: 8 mEq/L (ref 3–11)
BUN: 22.9 mg/dL (ref 7.0–26.0)
CO2: 26 meq/L (ref 22–29)
Calcium: 10 mg/dL (ref 8.4–10.4)
Chloride: 107 mEq/L (ref 98–109)
Creatinine: 0.9 mg/dL (ref 0.6–1.1)
EGFR: 76 mL/min/{1.73_m2} — AB (ref >90)
GLUCOSE: 94 mg/dL (ref 70–140)
POTASSIUM: 4 meq/L (ref 3.5–5.1)
SODIUM: 141 meq/L (ref 136–145)
Total Bilirubin: 0.44 mg/dL (ref 0.20–1.20)
Total Protein: 6.5 g/dL (ref 6.4–8.3)

## 2015-06-12 LAB — LIPASE, BLOOD: Lipase: 45 U/L (ref 11–51)

## 2015-06-12 LAB — AMYLASE: Amylase: 93 U/L (ref 28–100)

## 2015-06-12 MED ORDER — HEPARIN SOD (PORK) LOCK FLUSH 100 UNIT/ML IV SOLN
500.0000 [IU] | Freq: Once | INTRAVENOUS | Status: AC | PRN
  Administered 2015-06-12: 500 [IU]
  Filled 2015-06-12: qty 5

## 2015-06-12 MED ORDER — SODIUM CHLORIDE 0.9 % IJ SOLN
10.0000 mL | INTRAMUSCULAR | Status: DC | PRN
  Administered 2015-06-12: 10 mL
  Filled 2015-06-12: qty 10

## 2015-06-12 MED ORDER — DIPHENHYDRAMINE HCL 25 MG PO CAPS: 25.0000 mg | ORAL_CAPSULE | Freq: Once | ORAL | Status: DC

## 2015-06-12 MED ORDER — ACETAMINOPHEN 325 MG PO TABS
ORAL_TABLET | ORAL | Status: AC
  Filled 2015-06-12: qty 2

## 2015-06-12 MED ORDER — ACETAMINOPHEN 325 MG PO TABS
650.0000 mg | ORAL_TABLET | Freq: Once | ORAL | Status: AC
  Administered 2015-06-12: 650 mg via ORAL

## 2015-06-12 MED ORDER — TRASTUZUMAB CHEMO INJECTION 440 MG
6.0000 mg/kg | Freq: Once | INTRAVENOUS | Status: AC
  Administered 2015-06-12: 315 mg via INTRAVENOUS
  Filled 2015-06-12: qty 15

## 2015-06-12 MED ORDER — SODIUM CHLORIDE 0.9 % IV SOLN
Freq: Once | INTRAVENOUS | Status: AC
  Administered 2015-06-12: 11:00:00 via INTRAVENOUS

## 2015-06-12 NOTE — Patient Instructions (Signed)
[facility] Discharge Instructions for Patients Receiving Chemotherapy  Today you received the following chemotherapy agents:  Herceptin  To help prevent nausea and vomiting after your treatment, we encourage you to take your nausea medication as prescribed.   If you develop nausea and vomiting that is not controlled by your nausea medication, call the clinic.   BELOW ARE SYMPTOMS THAT SHOULD BE REPORTED IMMEDIATELY:  *FEVER GREATER THAN 100.5 F  *CHILLS WITH OR WITHOUT FEVER  NAUSEA AND VOMITING THAT IS NOT CONTROLLED WITH YOUR NAUSEA MEDICATION  *UNUSUAL SHORTNESS OF BREATH  *UNUSUAL BRUISING OR BLEEDING  TENDERNESS IN MOUTH AND THROAT WITH OR WITHOUT PRESENCE OF ULCERS  *URINARY PROBLEMS  *BOWEL PROBLEMS  UNUSUAL RASH Items with * indicate a potential emergency and should be followed up as soon as possible.  Feel free to call the clinic you have any questions or concerns. The clinic phone number is [phone].  Please show the CHEMO ALERT CARD at check-in to the Emergency Department and triage nurse.   

## 2015-06-12 NOTE — Progress Notes (Signed)
Patient Care Team: No Pcp Per Patient as PCP - General (General Practice)  DIAGNOSIS: No matching staging information was found for the patient.  SUMMARY OF ONCOLOGIC HISTORY:   Cancer of central portion of right female breast ([place])   11/14/2014 Initial Diagnosis Right breast biopsy 8:30 position: Invasive ductal carcinoma with DCIS, grade 2, ER 0%, PR 0%, HER-2 positive ratio 3.53   11/20/2014 Breast MRI Right breast: 3.7 x 2.8 x 1.7 cm area of non-mass enhancement and retroareolar right breast into the lower outer quadrant and lower inner quadrant extending into the nipple involving anterior two thirds of the breast, no lymph nodes   12/04/2014 -  Neo-Adjuvant Chemotherapy Taxotere, carboplatin, Herceptin, Perjeta 6 cycles followed by Herceptin maintenance for 1 year   12/19/2014 Procedure PALB2 Mutation   04/17/2015 Surgery Right Mastectomy: Microscopic focus of DCIS 0.1 cm 0/2 LN Neg. No invasive cancer. Path CR;  Left Mastectomy: Neg    CHIEF COMPLIANT: Follow-up on Herceptin  INTERVAL HISTORY: Robin Miles is a 39 year old with above-mentioned history of HER-2 positive right breast cancer she had neoadjuvant chemotherapy and had a complete pathologic response. She had bilateral mastectomies and reconstruction. She is currently on Herceptin maintenance appears to be tolerating it well. She is very paranoid about other cancers. She is planning to undergo bilateral salpingo-oophorectomy. She has noticed a tender spot in the left quadrant of her lower abdomen.  REVIEW OF SYSTEMS:   Constitutional: Denies fevers, chills or abnormal weight loss Eyes: Denies blurriness of vision Ears, nose, mouth, throat, and face: Denies mucositis or sore throat Respiratory: Denies cough, dyspnea or wheezes Cardiovascular: Denies palpitation, chest discomfort or lower extremity swelling Gastrointestinal:  Denies nausea, heartburn or change in bowel habits Skin: Denies abnormal skin rashes Lymphatics: Denies  new lymphadenopathy or easy bruising Neurological:Denies numbness, tingling or new weaknesses Behavioral/Psych: Anxious and worried about cancer  Breast:  denies any pain or lumps or nodules in either breasts All other systems were reviewed with the patient and are negative.  I have reviewed the past medical history, past surgical history, social history and family history with the patient and they are unchanged from previous note.  ALLERGIES:  is allergic to compazine.  MEDICATIONS:  Current Outpatient Prescriptions  Medication Sig Dispense Refill  . docusate sodium (COLACE) 100 MG capsule Take 1 capsule (100 mg total) by mouth 2 (two) times daily. 40 capsule 0  . doxycycline (VIBRA-TABS) 100 MG tablet Take 1 tablet (100 mg total) by mouth every 12 (twelve) hours. 30 tablet 0  . enoxaparin (LOVENOX) 40 MG/0.4ML injection Inject 0.4 mLs (40 mg total) into the skin daily. 10 Syringe 0  . ferrous sulfate 325 (65 FE) MG tablet Take 325 mg by mouth every other day.    Marland Kitchen HYDROmorphone (DILAUDID) 2 MG tablet Take 1-2 tablets (2-4 mg total) by mouth every 4 (four) hours as needed for moderate pain. 40 tablet 0  . LORazepam (ATIVAN) 0.5 MG tablet Take 1 tablet (0.5 mg total) by mouth every 6 (six) hours as needed for anxiety or sleep. 30 tablet 2  . methocarbamol (ROBAXIN) 500 MG tablet Take 1 tablet (500 mg total) by mouth every 6 (six) hours. 40 tablet 1  . Probiotic Product (PROBIOTIC PO) Take 1 capsule by mouth daily.    Marland Kitchen PROCTOSOL HC 2.5 % rectal cream INSERT 1 GRAM 4 TIMES A DAY BY RECTAL ROUTE AS NEEDED.  1   No current facility-administered medications for this visit.    PHYSICAL  EXAMINATION: ECOG PERFORMANCE STATUS: 1 - Symptomatic but completely ambulatory  Filed Vitals:   06/12/15 0930  BP: 111/65  Pulse: 71  Temp: 98.8 F (37.1 C)  Resp: 18   Filed Weights   06/12/15 0930  Weight: 116 lb 4.8 oz (52.753 kg)    GENERAL:alert, no distress and comfortable SKIN: skin color,  texture, turgor are normal, no rashes or significant lesions EYES: normal, Conjunctiva are pink and non-injected, sclera clear OROPHARYNX:no exudate, no erythema and lips, buccal mucosa, and tongue normal  NECK: supple, thyroid normal size, non-tender, without nodularity LYMPH:  no palpable lymphadenopathy in the cervical, axillary or inguinal LUNGS: clear to auscultation and percussion with normal breathing effort HEART: regular rate & rhythm and no murmurs and no lower extremity edema ABDOMEN:abdomen soft, non-tender and normal bowel sounds Musculoskeletal:no cyanosis of digits and no clubbing  NEURO: alert & oriented x 3 with fluent speech, no focal motor/sensory deficits  LABORATORY DATA:  I have reviewed the data as listed   Chemistry      Component Value Date/Time   NA 141 06/12/2015 0856   NA 136 01/02/2015 2217   K 4.0 06/12/2015 0856   K 3.8 01/02/2015 2217   CL 101 01/02/2015 2217   CO2 26 06/12/2015 0856   CO2 23 01/02/2015 2217   BUN 22.9 06/12/2015 0856   BUN 12 01/02/2015 2217   CREATININE 0.9 06/12/2015 0856   CREATININE 0.81 01/02/2015 2217      Component Value Date/Time   CALCIUM 10.0 06/12/2015 0856   CALCIUM 9.3 01/02/2015 2217   ALKPHOS 80 06/12/2015 0856   ALKPHOS 87 01/02/2015 2217   AST 16 06/12/2015 0856   AST 16 01/02/2015 2217   ALT 12 06/12/2015 0856   ALT 19 01/02/2015 2217   BILITOT 0.44 06/12/2015 0856   BILITOT 1.5* 01/02/2015 2217       Lab Results  Component Value Date   WBC 4.6 06/12/2015   HGB 11.9 06/12/2015   HCT 36.0 06/12/2015   MCV 88.6 06/12/2015   PLT 230 06/12/2015   NEUTROABS 1.6 06/12/2015    ASSESSMENT & PLAN:  Cancer of central portion of right female breast Right breast retroareolar mass 3.7 x 2.8 x 1.7 cm involving anterior two thirds of the breast, T2 N0 M0 stage II a clinical stage, ER 0%, appears 0%, HER-2 positive ratio 3.53, PALB 2 mutation, completed neo-adjuvant chemotherapy with [facility] Perjeta started  12/04/2014 and completed 03/20/2015  Right Mastectomy 04/17/15: Microscopic focus of DCIS 0.1 cm 0/2 LN Neg. No invasive cancer. Path CR; Left Mastectomy: Neg   Recommendation: Continue Herceptin maintenance for 1 year   Patient plans to get oophorectomy She is also getting enrolled in a surveillance clinical trial through [facility] for pancreatic cancer surveillance She is paranoid about every symptom that she has and wonders if it is related to any underlying malignancy. Left upper quadrant abdomen tender spot: No palpable abnormalities.  Arthritis in her knees and hips: Patient is concerned that it may be a sign of cancer. Although she has had long-standing issues with arthritis. WBC changes: We noticed that the neutrophil count has decreased to 1.6. We will recheck her blood counts and follow-up on it. She is supposed to have blood work next week and I will review those results.  Echocardiogram will need to be performed. RTC every 3 weeks for Herceptin and q 6 weeks for clinic follow ups  No orders of the defined types were placed in this encounter.  The patient has a good understanding of the overall plan. she agrees with it. she will call with any problems that may develop before the next visit here.   Rulon Eisenmenger, MD 06/12/2015

## 2015-06-12 NOTE — Telephone Encounter (Signed)
Echo to [facility] for precert and other appointments added and she will get an avs in chemo

## 2015-06-14 ENCOUNTER — Encounter: Payer: Self-pay | Admitting: Hematology and Oncology

## 2015-06-14 ENCOUNTER — Telehealth: Payer: Self-pay | Admitting: Hematology and Oncology

## 2015-06-14 NOTE — Progress Notes (Signed)
Put husband's fmla form on nurse's desk. °

## 2015-06-14 NOTE — Telephone Encounter (Signed)
Called patient with her echo appointment °

## 2015-06-17 ENCOUNTER — Encounter: Payer: Self-pay | Admitting: Hematology and Oncology

## 2015-06-17 NOTE — Progress Notes (Signed)
Antonietta Breach FMLA forms to [phone]. Forward copy to medical records and front lobby.

## 2015-06-19 ENCOUNTER — Other Ambulatory Visit: Payer: Self-pay | Admitting: Hematology and Oncology

## 2015-06-20 ENCOUNTER — Ambulatory Visit (HOSPITAL_COMMUNITY): Admission: RE | Admit: 2015-06-20 | Payer: BLUE CROSS/BLUE SHIELD | Source: Ambulatory Visit

## 2015-06-20 NOTE — Telephone Encounter (Signed)
Chart reviewed - per Dr. Lindi Adie, ok to fill

## 2015-06-24 ENCOUNTER — Other Ambulatory Visit (HOSPITAL_COMMUNITY): Payer: Self-pay | Admitting: Plastic Surgery

## 2015-06-27 ENCOUNTER — Encounter (HOSPITAL_COMMUNITY): Payer: Self-pay

## 2015-06-27 ENCOUNTER — Encounter (HOSPITAL_COMMUNITY)
Admission: RE | Admit: 2015-06-27 | Discharge: 2015-06-27 | Disposition: A | Discharge status: Home / Self Care | Payer: BLUE CROSS/BLUE SHIELD | Source: Ambulatory Visit | Attending: Hematology and Oncology | Admitting: Hematology and Oncology

## 2015-06-27 ENCOUNTER — Ambulatory Visit (HOSPITAL_COMMUNITY)
Admission: RE | Admit: 2015-06-27 | Discharge: 2015-06-27 | Disposition: A | Discharge status: Home / Self Care | Payer: BLUE CROSS/BLUE SHIELD | Source: Ambulatory Visit | Attending: Hematology and Oncology | Admitting: Hematology and Oncology

## 2015-06-27 DIAGNOSIS — C50111 Malignant neoplasm of central portion of right female breast: Secondary | ICD-10-CM | POA: Insufficient documentation

## 2015-06-27 DIAGNOSIS — Z09 Encounter for follow-up examination after completed treatment for conditions other than malignant neoplasm: Secondary | ICD-10-CM | POA: Diagnosis not present

## 2015-06-27 HISTORY — DX: Personal history of antineoplastic chemotherapy: Z92.21

## 2015-06-27 HISTORY — DX: Personal history of other diseases of the respiratory system: Z87.09

## 2015-06-27 HISTORY — DX: Bilateral primary osteoarthritis of knee: M17.0

## 2015-06-27 HISTORY — DX: Other specified postprocedural states: Z98.890

## 2015-06-27 HISTORY — DX: Nausea with vomiting, unspecified: R11.2

## 2015-06-27 LAB — BASIC METABOLIC PANEL
ANION GAP: 9 (ref 5–15)
BUN: 15 mg/dL (ref 6–20)
CALCIUM: 10 mg/dL (ref 8.9–10.3)
CHLORIDE: 105 mmol/L (ref 101–111)
CO2: 27 mmol/L (ref 22–32)
CREATININE: 0.96 mg/dL (ref 0.44–1.00)
GFR calc non Af Amer: >60 mL/min (ref >60)
Glucose, Bld: 104 mg/dL — ABNORMAL HIGH (ref 65–99)
Potassium: 4.4 mmol/L (ref 3.5–5.1)
SODIUM: 141 mmol/L (ref 135–145)

## 2015-06-27 LAB — CBC
HCT: 36.1 % (ref 36.0–46.0)
Hemoglobin: 12 g/dL (ref 12.0–15.0)
MCH: 28.9 pg (ref 26.0–34.0)
MCHC: 33.2 g/dL (ref 30.0–36.0)
MCV: 87 fL (ref 78.0–100.0)
PLATELETS: 271 10*3/uL (ref 150–400)
RBC: 4.15 MIL/uL (ref 3.87–5.11)
RDW: 12.5 % (ref 11.5–15.5)
WBC: 6.2 10*3/uL (ref 4.0–10.5)

## 2015-06-27 LAB — TYPE AND SCREEN
ABO/RH(D): A POS
Antibody Screen: NEGATIVE

## 2015-06-27 LAB — PREGNANCY, URINE: PREG TEST UR: NEGATIVE

## 2015-06-27 NOTE — Pre-Procedure Instructions (Signed)
    Robin Miles  06/27/2015      [id number] - [place], [place] - [address] [name] [address] Phone: [phone] Fax: [phone]    Your procedure is scheduled on Thursday, December 15th, 2016.  Report to [facility] Admitting at 10:00 A.M.  Call this number if you have problems the morning of surgery:  [phone]   Remember:  Do not eat food or drink liquids after midnight.   Take these medicines the morning of surgery with A SIP OF WATER: NONE.   Stop taking: Multivitamin, Aspirin, NSAIDS, Aleve, Naproxen, Ibuprofen, Advil, Motrin, BC's, Goody's, Fish oil, all herbal medications, and all vitamins.    Do not wear jewelry, make-up or nail polish.  Do not wear lotions, powders, or perfumes.  You may wear deodorant.  Do not shave 48 hours prior to surgery.   Do not bring valuables to the hospital.  [facility] is not responsible for any belongings or valuables.  Contacts, dentures or bridgework may not be worn into surgery.  Leave your suitcase in the car.  After surgery it may be brought to your room.  For patients admitted to the hospital, discharge time will be determined by your treatment team.  Patients discharged the day of surgery will not be allowed to drive home.   Special instructions:  See attached.   Please read over the following fact sheets that you were given. Pain Booklet, Coughing and Deep Breathing, Blood Transfusion Information and Surgical Site Infection Prevention

## 2015-06-27 NOTE — Progress Notes (Signed)
Echocardiogram 2D Echocardiogram has been performed.  Joelene Millin 06/27/2015, 9:57 AM

## 2015-06-27 NOTE — Progress Notes (Signed)
PCP - Pt. States that she uses Dr. Ouida Sills as her PCP Cardiologist - denies  EKG - denies CXR - denies  Echo- 06/27/15 Stress test/Cardiac Cath - denies  Patient denies chest pain and shortness of breath at PAT appointment.

## 2015-07-03 ENCOUNTER — Ambulatory Visit (HOSPITAL_BASED_OUTPATIENT_CLINIC_OR_DEPARTMENT_OTHER): Payer: BLUE CROSS/BLUE SHIELD

## 2015-07-03 VITALS — BP 110/61 | HR 79 | Temp 98.0°F | Resp 18

## 2015-07-03 DIAGNOSIS — Z5112 Encounter for antineoplastic immunotherapy: Secondary | ICD-10-CM

## 2015-07-03 DIAGNOSIS — C50111 Malignant neoplasm of central portion of right female breast: Secondary | ICD-10-CM | POA: Diagnosis not present

## 2015-07-03 MED ORDER — HEPARIN SOD (PORK) LOCK FLUSH 100 UNIT/ML IV SOLN
500.0000 [IU] | Freq: Once | INTRAVENOUS | Status: AC | PRN
  Administered 2015-07-03: 500 [IU]
  Filled 2015-07-03: qty 5

## 2015-07-03 MED ORDER — SODIUM CHLORIDE 0.9 % IJ SOLN
10.0000 mL | INTRAMUSCULAR | Status: DC | PRN
  Administered 2015-07-03: 10 mL
  Filled 2015-07-03: qty 10

## 2015-07-03 MED ORDER — SODIUM CHLORIDE 0.9 % IV SOLN
Freq: Once | INTRAVENOUS | Status: AC
  Administered 2015-07-03: 10:00:00 via INTRAVENOUS

## 2015-07-03 MED ORDER — SODIUM CHLORIDE 0.9 % IV SOLN
6.0000 mg/kg | Freq: Once | INTRAVENOUS | Status: AC
  Administered 2015-07-03: 315 mg via INTRAVENOUS
  Filled 2015-07-03: qty 15

## 2015-07-03 NOTE — Progress Notes (Signed)
Pt states she took Tylenol (couldn't remember dose amount) @ 8 this am and took children's Benadryl @ [id number] in treatment area today.

## 2015-07-03 NOTE — Patient Instructions (Signed)
[facility] Discharge Instructions for Patients Receiving Chemotherapy  Today you received the following chemotherapy agents: Herceptin   To help prevent nausea and vomiting after your treatment, we encourage you to take your nausea medication as directed.    If you develop nausea and vomiting that is not controlled by your nausea medication, call the clinic.   BELOW ARE SYMPTOMS THAT SHOULD BE REPORTED IMMEDIATELY:  *FEVER GREATER THAN 100.5 F  *CHILLS WITH OR WITHOUT FEVER  NAUSEA AND VOMITING THAT IS NOT CONTROLLED WITH YOUR NAUSEA MEDICATION  *UNUSUAL SHORTNESS OF BREATH  *UNUSUAL BRUISING OR BLEEDING  TENDERNESS IN MOUTH AND THROAT WITH OR WITHOUT PRESENCE OF ULCERS  *URINARY PROBLEMS  *BOWEL PROBLEMS  UNUSUAL RASH Items with * indicate a potential emergency and should be followed up as soon as possible.  Feel free to call the clinic you have any questions or concerns. The clinic phone number is [phone].  Please show the CHEMO ALERT CARD at check-in to the Emergency Department and triage nurse.   

## 2015-07-04 ENCOUNTER — Ambulatory Visit (HOSPITAL_COMMUNITY): Payer: BLUE CROSS/BLUE SHIELD

## 2015-07-04 ENCOUNTER — Encounter (HOSPITAL_COMMUNITY)
Admission: RE | Disposition: A | Discharge status: Home / Self Care | Payer: Self-pay | Source: Ambulatory Visit | Attending: Plastic Surgery

## 2015-07-04 ENCOUNTER — Ambulatory Visit (HOSPITAL_COMMUNITY)
Admission: RE | Admit: 2015-07-04 | Discharge: 2015-07-06 | Disposition: A | Discharge status: Home / Self Care | Payer: BLUE CROSS/BLUE SHIELD | Source: Ambulatory Visit | Attending: Plastic Surgery | Admitting: Plastic Surgery

## 2015-07-04 ENCOUNTER — Encounter (HOSPITAL_COMMUNITY): Payer: Self-pay | Admitting: *Deleted

## 2015-07-04 DIAGNOSIS — C50511 Malignant neoplasm of lower-outer quadrant of right female breast: Secondary | ICD-10-CM | POA: Insufficient documentation

## 2015-07-04 DIAGNOSIS — Z8543 Personal history of malignant neoplasm of ovary: Secondary | ICD-10-CM | POA: Insufficient documentation

## 2015-07-04 DIAGNOSIS — C50919 Malignant neoplasm of unspecified site of unspecified female breast: Secondary | ICD-10-CM | POA: Diagnosis present

## 2015-07-04 DIAGNOSIS — Z9013 Acquired absence of bilateral breasts and nipples: Secondary | ICD-10-CM | POA: Diagnosis not present

## 2015-07-04 DIAGNOSIS — C50311 Malignant neoplasm of lower-inner quadrant of right female breast: Secondary | ICD-10-CM | POA: Insufficient documentation

## 2015-07-04 DIAGNOSIS — Z1501 Genetic susceptibility to malignant neoplasm of breast: Secondary | ICD-10-CM | POA: Diagnosis not present

## 2015-07-04 HISTORY — PX: REMOVAL OF BILATERAL TISSUE EXPANDERS WITH PLACEMENT OF BILATERAL BREAST IMPLANTS: SHX6431

## 2015-07-04 HISTORY — PX: LAPAROSCOPIC BILATERAL SALPINGO OOPHERECTOMY: SHX5890

## 2015-07-04 SURGERY — REMOVAL, TISSUE EXPANDER, BREAST, BILATERAL, WITH BILATERAL IMPLANT IMPLANT INSERTION
Anesthesia: General | Site: Breast | Laterality: Bilateral

## 2015-07-04 MED ORDER — GLYCOPYRROLATE 0.2 MG/ML IJ SOLN
INTRAMUSCULAR | Status: DC | PRN
  Administered 2015-07-04 (×2): .1 mg via INTRAVENOUS

## 2015-07-04 MED ORDER — FENTANYL CITRATE (PF) 250 MCG/5ML IJ SOLN
INTRAMUSCULAR | Status: AC
  Filled 2015-07-04: qty 5

## 2015-07-04 MED ORDER — PROPOFOL 10 MG/ML IV BOLUS
INTRAVENOUS | Status: AC
  Filled 2015-07-04: qty 20

## 2015-07-04 MED ORDER — EPHEDRINE SULFATE 50 MG/ML IJ SOLN
INTRAMUSCULAR | Status: AC
  Filled 2015-07-04: qty 1

## 2015-07-04 MED ORDER — ONDANSETRON HCL 4 MG/2ML IJ SOLN
INTRAMUSCULAR | Status: DC | PRN
  Administered 2015-07-04: 4 mg via INTRAVENOUS

## 2015-07-04 MED ORDER — PHENYLEPHRINE 40 MCG/ML (10ML) SYRINGE FOR IV PUSH (FOR BLOOD PRESSURE SUPPORT)
PREFILLED_SYRINGE | INTRAVENOUS | Status: AC
  Filled 2015-07-04: qty 10

## 2015-07-04 MED ORDER — ROCURONIUM BROMIDE 50 MG/5ML IV SOLN
INTRAVENOUS | Status: AC
  Filled 2015-07-04: qty 1

## 2015-07-04 MED ORDER — MIDAZOLAM HCL 5 MG/5ML IJ SOLN
INTRAMUSCULAR | Status: DC | PRN
  Administered 2015-07-04 (×2): 1 mg via INTRAVENOUS

## 2015-07-04 MED ORDER — PROPOFOL 10 MG/ML IV BOLUS
INTRAVENOUS | Status: DC | PRN
  Administered 2015-07-04: 150 mg via INTRAVENOUS

## 2015-07-04 MED ORDER — FENTANYL CITRATE (PF) 100 MCG/2ML IJ SOLN
INTRAMUSCULAR | Status: DC | PRN
  Administered 2015-07-04: 25 ug via INTRAVENOUS
  Administered 2015-07-04: 50 ug via INTRAVENOUS
  Administered 2015-07-04: 25 ug via INTRAVENOUS
  Administered 2015-07-04 (×6): 50 ug via INTRAVENOUS
  Administered 2015-07-04 (×2): 25 ug via INTRAVENOUS

## 2015-07-04 MED ORDER — METHOCARBAMOL 500 MG PO TABS
500.0000 mg | ORAL_TABLET | Freq: Four times a day (QID) | ORAL | Status: DC | PRN
Start: 2015-07-04 — End: 2015-07-06
  Administered 2015-07-04 – 2015-07-06 (×5): 500 mg via ORAL
  Filled 2015-07-04 (×4): qty 1

## 2015-07-04 MED ORDER — CEFAZOLIN SODIUM 1-5 GM-% IV SOLN
1.0000 g | Freq: Four times a day (QID) | INTRAVENOUS | Status: DC
  Administered 2015-07-04 – 2015-07-05 (×2): 1 g via INTRAVENOUS
  Filled 2015-07-04 (×4): qty 50

## 2015-07-04 MED ORDER — DEXTROSE-NACL 5-0.45 % IV SOLN
INTRAVENOUS | Status: DC
  Administered 2015-07-04: 21:00:00 via INTRAVENOUS

## 2015-07-04 MED ORDER — LACTATED RINGERS IV SOLN
INTRAVENOUS | Status: DC
  Administered 2015-07-04 (×2): via INTRAVENOUS

## 2015-07-04 MED ORDER — ROCURONIUM BROMIDE 100 MG/10ML IV SOLN
INTRAVENOUS | Status: DC | PRN
  Administered 2015-07-04: 10 mg via INTRAVENOUS
  Administered 2015-07-04: 40 mg via INTRAVENOUS
  Administered 2015-07-04: 10 mg via INTRAVENOUS
  Administered 2015-07-04: 20 mg via INTRAVENOUS

## 2015-07-04 MED ORDER — METHOCARBAMOL 500 MG PO TABS
ORAL_TABLET | ORAL | Status: AC
  Administered 2015-07-04: 21:00:00
  Filled 2015-07-04: qty 1

## 2015-07-04 MED ORDER — HYDROMORPHONE HCL 1 MG/ML IJ SOLN
0.2500 mg | INTRAMUSCULAR | Status: DC | PRN
  Administered 2015-07-04 (×2): 0.5 mg via INTRAVENOUS
  Administered 2015-07-04 (×2): 0.25 mg via INTRAVENOUS
  Administered 2015-07-04: 0.5 mg via INTRAVENOUS

## 2015-07-04 MED ORDER — 0.9 % SODIUM CHLORIDE (POUR BTL) OPTIME
TOPICAL | Status: DC | PRN
  Administered 2015-07-04 (×2): 1000 mL

## 2015-07-04 MED ORDER — SODIUM CHLORIDE 0.9 % IR SOLN
Status: DC | PRN
  Administered 2015-07-04: 1000 mL

## 2015-07-04 MED ORDER — SUGAMMADEX SODIUM 200 MG/2ML IV SOLN
INTRAVENOUS | Status: DC | PRN
  Administered 2015-07-04: 100 mg via INTRAVENOUS

## 2015-07-04 MED ORDER — MIDAZOLAM HCL 2 MG/2ML IJ SOLN
INTRAMUSCULAR | Status: AC
  Filled 2015-07-04: qty 2

## 2015-07-04 MED ORDER — LIDOCAINE HCL (CARDIAC) 20 MG/ML IV SOLN
INTRAVENOUS | Status: DC | PRN
  Administered 2015-07-04: 80 mg via INTRAVENOUS

## 2015-07-04 MED ORDER — SODIUM CHLORIDE 0.9 % IJ SOLN
INTRAMUSCULAR | Status: AC
  Filled 2015-07-04: qty 10

## 2015-07-04 MED ORDER — SCOPOLAMINE 1 MG/3DAYS TD PT72
1.0000 | MEDICATED_PATCH | Freq: Once | TRANSDERMAL | Status: DC
  Administered 2015-07-04: 1.5 mg via TRANSDERMAL
  Filled 2015-07-04: qty 1

## 2015-07-04 MED ORDER — PHENYLEPHRINE HCL 10 MG/ML IJ SOLN
INTRAMUSCULAR | Status: DC | PRN
  Administered 2015-07-04: 40 ug via INTRAVENOUS
  Administered 2015-07-04: 80 ug via INTRAVENOUS
  Administered 2015-07-04 (×4): 40 ug via INTRAVENOUS

## 2015-07-04 MED ORDER — ONDANSETRON HCL 4 MG/2ML IJ SOLN
INTRAMUSCULAR | Status: AC
  Filled 2015-07-04: qty 2

## 2015-07-04 MED ORDER — CEFAZOLIN SODIUM-DEXTROSE 2-3 GM-% IV SOLR
2.0000 g | INTRAVENOUS | Status: AC
  Administered 2015-07-04: 2 g via INTRAVENOUS
  Filled 2015-07-04: qty 50

## 2015-07-04 MED ORDER — BUPIVACAINE HCL (PF) 0.25 % IJ SOLN
INTRAMUSCULAR | Status: DC | PRN
  Administered 2015-07-04: 8 mL

## 2015-07-04 MED ORDER — HYDROMORPHONE HCL 2 MG PO TABS
2.0000 mg | ORAL_TABLET | ORAL | Status: DC | PRN
  Administered 2015-07-05 (×2): 4 mg via ORAL
  Filled 2015-07-04 (×2): qty 2

## 2015-07-04 MED ORDER — ENOXAPARIN SODIUM 40 MG/0.4ML ~~LOC~~ SOLN
40.0000 mg | SUBCUTANEOUS | Status: DC
  Administered 2015-07-05 – 2015-07-06 (×2): 40 mg via SUBCUTANEOUS
  Filled 2015-07-04 (×2): qty 0.4

## 2015-07-04 MED ORDER — HYDROMORPHONE HCL 1 MG/ML IJ SOLN
0.5000 mg | INTRAMUSCULAR | Status: DC | PRN
  Administered 2015-07-05 (×3): 1 mg via INTRAVENOUS
  Filled 2015-07-04 (×3): qty 1

## 2015-07-04 MED ORDER — EPHEDRINE SULFATE 50 MG/ML IJ SOLN
INTRAMUSCULAR | Status: DC | PRN
  Administered 2015-07-04 (×2): 5 mg via INTRAVENOUS

## 2015-07-04 MED ORDER — PROMETHAZINE HCL 25 MG/ML IJ SOLN: 6.2500 mg | INTRAMUSCULAR | Status: DC | PRN

## 2015-07-04 MED ORDER — PHENYLEPHRINE HCL 10 MG/ML IJ SOLN
10.0000 mg | INTRAVENOUS | Status: DC | PRN
  Administered 2015-07-04: 5 ug/min via INTRAVENOUS

## 2015-07-04 MED ORDER — HYDROMORPHONE HCL 1 MG/ML IJ SOLN
INTRAMUSCULAR | Status: AC
  Filled 2015-07-04: qty 1

## 2015-07-04 MED ORDER — SODIUM CHLORIDE 0.9 % IR SOLN
Status: DC | PRN
  Administered 2015-07-04: 500 mL

## 2015-07-04 MED ORDER — SUCCINYLCHOLINE CHLORIDE 20 MG/ML IJ SOLN
INTRAMUSCULAR | Status: AC
  Filled 2015-07-04: qty 1

## 2015-07-04 MED ORDER — DOCUSATE SODIUM 100 MG PO CAPS
100.0000 mg | ORAL_CAPSULE | Freq: Every day | ORAL | Status: DC
  Administered 2015-07-05 – 2015-07-06 (×2): 100 mg via ORAL
  Filled 2015-07-04 (×2): qty 1

## 2015-07-04 MED ORDER — POVIDONE-IODINE 5 % EX AERS
INHALATION_SPRAY | CUTANEOUS | Status: DC | PRN
  Administered 2015-07-04: 1 via TOPICAL

## 2015-07-04 MED ORDER — BUPIVACAINE HCL (PF) 0.25 % IJ SOLN
INTRAMUSCULAR | Status: AC
  Filled 2015-07-04: qty 30

## 2015-07-04 MED ORDER — HEPARIN SODIUM (PORCINE) 5000 UNIT/ML IJ SOLN
5000.0000 [IU] | Freq: Once | INTRAMUSCULAR | Status: AC
  Administered 2015-07-04: 5000 [IU] via SUBCUTANEOUS
  Filled 2015-07-04: qty 1

## 2015-07-04 MED ORDER — SUGAMMADEX SODIUM 200 MG/2ML IV SOLN
INTRAVENOUS | Status: AC
  Filled 2015-07-04: qty 2

## 2015-07-04 SURGICAL SUPPLY — 70 items
ADH SKN CLS APL DERMABOND .7 (GAUZE/BANDAGES/DRESSINGS) ×4
APPLIER CLIP 9.375 MED OPEN (MISCELLANEOUS)
APR CLP MED 9.3 20 MLT OPN (MISCELLANEOUS)
ATCH SMKEVC FLXB CAUT HNDSWH (FILTER) ×2 IMPLANT
BAG DECANTER FOR FLEXI CONT (MISCELLANEOUS) ×4 IMPLANT
BAG SPEC RTRVL LRG 6X4 10 (ENDOMECHANICALS) ×4
BINDER BREAST LRG (GAUZE/BANDAGES/DRESSINGS) ×1 IMPLANT
BINDER BREAST MEDIUM (GAUZE/BANDAGES/DRESSINGS) IMPLANT
BINDER BREAST XLRG (GAUZE/BANDAGES/DRESSINGS) IMPLANT
BIOPATCH RED 1 DISK 7.0 (GAUZE/BANDAGES/DRESSINGS) ×6 IMPLANT
BLADE SURG 10 STRL SS (BLADE) ×3 IMPLANT
CANISTER SUCTION 2500CC (MISCELLANEOUS) ×3 IMPLANT
CATH ROBINSON RED A/P 16FR (CATHETERS) ×1 IMPLANT
CHLORAPREP W/TINT 26ML (MISCELLANEOUS) ×5 IMPLANT
CLIP APPLIE 9.375 MED OPEN (MISCELLANEOUS) IMPLANT
COVER SURGICAL LIGHT HANDLE (MISCELLANEOUS) ×4 IMPLANT
DERMABOND ADVANCED (GAUZE/BANDAGES/DRESSINGS) ×2
DERMABOND ADVANCED .7 DNX12 (GAUZE/BANDAGES/DRESSINGS) IMPLANT
DRAIN CHANNEL 19F RND (DRAIN) ×6 IMPLANT
DRAPE ORTHO SPLIT 77X108 STRL (DRAPES) ×6
DRAPE PROXIMA HALF (DRAPES) ×6 IMPLANT
DRAPE SURG ORHT 6 SPLT 77X108 (DRAPES) ×4 IMPLANT
DRAPE WARM FLUID 44X44 (DRAPE) ×2 IMPLANT
DRSG PAD ABDOMINAL 8X10 ST (GAUZE/BANDAGES/DRESSINGS) ×9 IMPLANT
DRSG SORBAVIEW 3.5X5-5/16 MED (GAUZE/BANDAGES/DRESSINGS) ×1 IMPLANT
DRSG TEGADERM 4X4.75 (GAUZE/BANDAGES/DRESSINGS) ×5 IMPLANT
DRSG TELFA 3X8 NADH (GAUZE/BANDAGES/DRESSINGS) IMPLANT
ELECT BLADE 6.5 EXT (BLADE) IMPLANT
ELECT CAUTERY BLADE 6.4 (BLADE) ×2 IMPLANT
ELECT REM PT RETURN 9FT ADLT (ELECTROSURGICAL) ×3
ELECTRODE REM PT RTRN 9FT ADLT (ELECTROSURGICAL) ×2 IMPLANT
EVACUATOR SILICONE 100CC (DRAIN) ×6 IMPLANT
EVACUATOR SMOKE ACCUVAC VALLEY (FILTER) ×1
GAUZE SPONGE 2X2 8PLY STRL LF (GAUZE/BANDAGES/DRESSINGS) IMPLANT
GLOVE BIO SURGEON STRL SZ7.5 (GLOVE) ×3 IMPLANT
GLOVE BIOGEL PI IND STRL 8 (GLOVE) ×2 IMPLANT
GLOVE BIOGEL PI INDICATOR 8 (GLOVE) ×1
GOWN STRL REUS W/ TWL LRG LVL3 (GOWN DISPOSABLE) ×2 IMPLANT
GOWN STRL REUS W/ TWL XL LVL3 (GOWN DISPOSABLE) ×2 IMPLANT
GOWN STRL REUS W/TWL LRG LVL3 (GOWN DISPOSABLE) ×6
GOWN STRL REUS W/TWL XL LVL3 (GOWN DISPOSABLE) ×3
IMPL GEL HI PROFILE 350CC (Breast) IMPLANT
IMPLANT GEL HI PROFILE 350CC (Breast) ×6 IMPLANT
KIT BASIN OR (CUSTOM PROCEDURE TRAY) ×3 IMPLANT
KIT ROOM TURNOVER OR (KITS) ×3 IMPLANT
MARKER SKIN DUAL TIP RULER LAB (MISCELLANEOUS) ×3 IMPLANT
NS IRRIG 1000ML POUR BTL (IV SOLUTION) ×5 IMPLANT
PACK GENERAL/GYN (CUSTOM PROCEDURE TRAY) ×3 IMPLANT
PAD ARMBOARD 7.5X6 YLW CONV (MISCELLANEOUS) ×4 IMPLANT
PAD DRESSING TELFA 3X8 NADH (GAUZE/BANDAGES/DRESSINGS) IMPLANT
POUCH SPECIMEN RETRIEVAL 10MM (ENDOMECHANICALS) ×2 IMPLANT
PREFILTER EVAC NS 1 1/3-3/8IN (MISCELLANEOUS) ×3 IMPLANT
SEALER TISSUE G2 STRG ARTC 35C (ENDOMECHANICALS) ×1 IMPLANT
SET IRRIG TUBING LAPAROSCOPIC (IRRIGATION / IRRIGATOR) ×1 IMPLANT
SOLUTION BETADINE 4OZ (MISCELLANEOUS) ×4 IMPLANT
SPONGE GAUZE 2X2 STER 10/PKG (GAUZE/BANDAGES/DRESSINGS) ×1
STAPLER VISISTAT 35W (STAPLE) ×1 IMPLANT
SUT MNCRL AB 3-0 PS2 18 (SUTURE) ×12 IMPLANT
SUT MNCRL AB 4-0 PS2 18 (SUTURE) ×2 IMPLANT
SUT PDS AB 3-0 SH 27 (SUTURE) IMPLANT
SUT PROLENE 3 0 PS 2 (SUTURE) ×7 IMPLANT
SUT VIC AB 3-0 SH 18 (SUTURE) ×4 IMPLANT
SUT VICRYL 0 UR6 27IN ABS (SUTURE) ×1 IMPLANT
SUT VICRYL RAPIDE 4/0 PS 2 (SUTURE) ×1 IMPLANT
SYR BULB IRRIGATION 50ML (SYRINGE) ×3 IMPLANT
TOWEL OR 17X26 10 PK STRL BLUE (TOWEL DISPOSABLE) ×3 IMPLANT
TRAY FOLEY CATH 16FR SILVER (SET/KITS/TRAYS/PACK) IMPLANT
TRAY LAPAROSCOPIC MC (CUSTOM PROCEDURE TRAY) ×1 IMPLANT
TUBE CONNECTING 12X1/4 (SUCTIONS) ×3 IMPLANT
TUBING INSUFFLATION (TUBING) ×1 IMPLANT

## 2015-07-04 NOTE — Anesthesia Postprocedure Evaluation (Signed)
Anesthesia Post Note  Patient: Robin Miles  Procedure(s) Performed: Procedure(s) (LRB): REMOVAL OF BILATERAL TISSUE EXPANDERS WITH PLACEMENT OF LEFT AND RIGHT BREAST IMPLANTS FOR DELAYED BREAST RECONSCTUCTION (Bilateral) LAPAROSCOPIC BILATERAL SALPINGO OOPHORECTOMY (Bilateral)  Patient location during evaluation: PACU Anesthesia Type: General Level of consciousness: awake and alert Pain management: pain level controlled Vital Signs Assessment: post-procedure vital signs reviewed and stable Respiratory status: spontaneous breathing, nonlabored ventilation, respiratory function stable and patient connected to nasal cannula oxygen Cardiovascular status: blood pressure returned to baseline and stable Postop Assessment: no signs of nausea or vomiting Anesthetic complications: no    Last Vitals:  Filed Vitals:   07/04/15 2042 07/04/15 2045  BP: 109/62   Pulse:  83  Temp:    Resp:  17    Last Pain:  Filed Vitals:   07/04/15 2057  PainSc: 0-No pain                 Fabian Coca A

## 2015-07-04 NOTE — Brief Op Note (Signed)
07/04/2015  7:02 PM  PATIENT:  Robin Miles  39 y.o. female  PRE-OPERATIVE DIAGNOSIS:  BILATERAL BREAST CANCER  POST-OPERATIVE DIAGNOSIS:  Bilateral Breast cancer  PROCEDURE:  Procedure(s): REMOVAL OF BILATERAL TISSUE EXPANDERS WITH PLACEMENT OF BILATERAL BREAST IMPLANTS FOR DELAYED BREAST RECONSCTUCTION (Bilateral) LAPAROSCOPIC BILATERAL SALPINGO OOPHORECTOMY (Bilateral)  SURGEON:  Surgeon(s) and Role: Panel 1:    * Crissie Reese, MD - Primary  Panel 2:    * Olga Millers, MD - Primary  PHYSICIAN ASSISTANT:   ASSISTANTS: none   ANESTHESIA:   general  EBL:     BLOOD ADMINISTERED:none  DRAINS: (2) Jackson-Pratt drain(s) with closed bulb suction in the right chest (1) and left chest (1)   LOCAL MEDICATIONS USED:  NONE  SPECIMEN:  No Specimen  DISPOSITION OF SPECIMEN:  N/A  COUNTS:  YES  TOURNIQUET:  * No tourniquets in log *  DICTATION: .Other Dictation: Dictation Number [id number]  PLAN OF CARE: Admit for overnight observation  PATIENT DISPOSITION:  PACU - hemodynamically stable.   Delay start of Pharmacological VTE agent (>24hrs) due to surgical blood loss or risk of bleeding: no

## 2015-07-04 NOTE — H&P (Signed)
I have re-examined and re-evaluated the patient and there are no changes.  See office notes in paper chart for H&P. 

## 2015-07-04 NOTE — Transfer of Care (Signed)
Immediate Anesthesia Transfer of Care Note  Patient: Robin Miles  Procedure(s) Performed: Procedure(s): REMOVAL OF BILATERAL TISSUE EXPANDERS WITH PLACEMENT OF BILATERAL BREAST IMPLANTS FOR DELAYED BREAST RECONSCTUCTION (Bilateral) LAPAROSCOPIC BILATERAL SALPINGO OOPHORECTOMY (Bilateral)  Patient Location: PACU  Anesthesia Type:General  Level of Consciousness: awake, alert , oriented and patient cooperative  Airway & Oxygen Therapy: Patient Spontanous Breathing and Patient connected to nasal cannula oxygen  Post-op Assessment: Report given to RN and Post -op Vital signs reviewed and stable  Post vital signs: Reviewed and stable  Last Vitals:  Filed Vitals:   07/04/15 1302 07/04/15 2026  BP: 108/59 112/78  Pulse: 72 76  Temp: 36.5 C 36.4 C  Resp:  11    Complications: No apparent anesthesia complications

## 2015-07-04 NOTE — Anesthesia Preprocedure Evaluation (Addendum)
Anesthesia Evaluation  Patient identified by MRN, date of birth, ID band Patient awake    Reviewed: Allergy & Precautions, H&P , NPO status , Patient's Chart, lab work & pertinent test results  History of Anesthesia Complications (+) PONV  Airway Mallampati: I  TM Distance: >3 FB Neck ROM: Full    Dental no notable dental hx. (+) Teeth Intact, Dental Advisory Given   Pulmonary neg pulmonary ROS,    Pulmonary exam normal breath sounds clear to auscultation       Cardiovascular negative cardio ROS   Rhythm:Regular Rate:Normal     Neuro/Psych Anxiety negative neurological ROS  negative psych ROS   GI/Hepatic negative GI ROS, Neg liver ROS,   Endo/Other  negative endocrine ROS  Renal/GU negative Renal ROS  negative genitourinary   Musculoskeletal  (+) Arthritis , Osteoarthritis,    Abdominal   Peds  Hematology negative hematology ROS (+)   Anesthesia Other Findings   Reproductive/Obstetrics negative OB ROS                            Anesthesia Physical Anesthesia Plan  ASA: II  Anesthesia Plan: General   Post-op Pain Management:    Induction: Intravenous  Airway Management Planned: Oral ETT  Additional Equipment:   Intra-op Plan:   Post-operative Plan: Extubation in OR  Informed Consent: I have reviewed the patients History and Physical, chart, labs and discussed the procedure including the risks, benefits and alternatives for the proposed anesthesia with the patient or authorized representative who has indicated his/her understanding and acceptance.   Dental advisory given  Plan Discussed with: CRNA  Anesthesia Plan Comments:         Anesthesia Quick Evaluation

## 2015-07-04 NOTE — Anesthesia Procedure Notes (Signed)
Procedure Name: Intubation Date/Time: 07/04/2015 5:25 PM Performed by: Merdis Delay Pre-anesthesia Checklist: Patient identified, Timeout performed, Emergency Drugs available, Suction available and Patient being monitored Patient Re-evaluated:Patient Re-evaluated prior to inductionOxygen Delivery Method: Circle system utilized Preoxygenation: Pre-oxygenation with 100% oxygen Intubation Type: IV induction Ventilation: Mask ventilation without difficulty Laryngoscope Size: Mac and 3 Grade View: Grade I Tube type: Oral Tube size: 7.0 mm Number of attempts: 1 Placement Confirmation: ETT inserted through vocal cords under direct vision,  breath sounds checked- equal and bilateral,  CO2 detector and positive ETCO2 Secured at: 22 cm Tube secured with: Tape Dental Injury: Teeth and Oropharynx as per pre-operative assessment

## 2015-07-04 NOTE — Progress Notes (Signed)
Pt's surgery moved to 1645 this evening. Pt going to leave hospital and return at around 1300 per OR. Admissions called to see if pt should be discharged. This nurse instructed not to discharge pt out of system but pt needed to check back in with Admissions Office upon arrival back to hospital later this afternoon. Pt verbalized understanding.

## 2015-07-04 NOTE — Op Note (Signed)
Robin Miles, Robin Miles                   ACCOUNT NO.:  [id number]  MEDICAL RECORD NO.:  [id number]  LOCATION:  [facility]                         FACILITY:  [facility]  PHYSICIAN:  Crissie Reese, M.D.     DATE OF BIRTH:  1975/10/01  DATE OF PROCEDURE:  07/04/2015 DATE OF DISCHARGE:                              OPERATIVE REPORT   PREOPERATIVE DIAGNOSES: 1. Breast cancer. 2. Genetic susceptibilityty for breast cancer.  POSTOPERATIVE DIAGNOSES: 1. Breast cancer. 2. Genetic susceptibility for breast cancer.  PROCEDURE PERFORMED: 1. Right, removal of tissue expander and delayed breast reconstruction     with silicone gel implant as a stage procedure. 2. Left, removal of tissue expander and delayed breast reconstruction     with silicone gel implant as a stage procedure.  SURGEON:  Crissie Reese, M.D.  ANESTHESIA:  General.  ESTIMATED BLOOD LOSS:  5 mL.  DRAINS:  One 19-French on each side.  CLINICAL NOTE:  This is a 39 year old woman who has had breast cancer and has had bilateral mastectomy with placement of tissue expanders. She has been expanded and selected for the next stage of silicone gel implant, [id number] mL volume.  She understood the FDA recommendation for MRI every 2 years and silicone gel in place.  Risks and possible complications were discussed with her including, but not limited to, bleeding, infection, healing problems, scarring, loss of sensation, fluid accumulations, anesthesia-related complications, pneumothorax, DVT, PE, failure of device, capsular contracture, displacement of device, wrinkles and ripples, contour deformities, contour deformities of the periphery of the reconstruction, chronic pain and overall disappointment as well as asymmetry.  She understood all this and wished to proceed.  DESCRIPTION OF PROCEDURE:  The patient was marked in the holding area for the breast reconstructions.  She was taken to the operating room and placed supine.  After successful induction  of general anesthesia, she was prepped with ChloraPrep and after waiting the full 3 minutes for drying, she was draped with sterile drapes.  The old mastectomy scars were utilized and dissection carried down through the subcutaneous tissue and the skin flaps were elevated for short distance both superior and inferior in order to allow for a muscle-splitting incision to access the tissue expanders.  Once the tissue expanders were accessed, they were deflated, mobilized and gently removed.  The spaces were inspected and noted to be in excellent condition.  They were irrigated thoroughly with saline as well as an antibiotic solution.  A 19-French drain was positioned on each side, brought out through separate stab wound inferolaterally and secured with a 3-0 Prolene suture.  The implants were soaked in antibiotic solution.  These were Mentor high-profile 350 mL smooth round gel implants.  After prepping the skin with Betadine and then thoroughly cleaning gloves, the antibiotic solution was again placed in the space on each side and the implants were then inserted and positioned.  Care was taken to make sure they were oriented properly. Antibiotic solution again used and the closure of the muscle layer with 3-0 Vicryl simple interrupted sutures with great care taken to avoid damage to underlying implants, which were kept under direct vision at all times.  Antibiotic solution  again for the wounds as irrigation and the remainder of the wound closures with 3-0 Monocryl interrupted inverted deep dermal sutures and 4-0 Monocryl running subcuticular suture.  Dermabond and dry sterile dressings were placed.  The drains were dressed with Biopatch and SorbaView dressings.  Dr. Ouida Sills then came in to perform the oophorectomy.     Crissie Reese, M.D.     DB/MEDQ  D:  07/04/2015  T:  07/04/2015  Job:  [id number]

## 2015-07-05 ENCOUNTER — Encounter (HOSPITAL_COMMUNITY): Payer: Self-pay | Admitting: Plastic Surgery

## 2015-07-05 ENCOUNTER — Telehealth: Payer: Self-pay | Admitting: Hematology and Oncology

## 2015-07-05 DIAGNOSIS — C50311 Malignant neoplasm of lower-inner quadrant of right female breast: Secondary | ICD-10-CM | POA: Diagnosis not present

## 2015-07-05 MED ORDER — DOXYCYCLINE HYCLATE 100 MG PO TABS: 100.0000 mg | ORAL_TABLET | Freq: Two times a day (BID) | ORAL | Status: DC

## 2015-07-05 MED ORDER — OXYCODONE-ACETAMINOPHEN 5-325 MG PO TABS
1.0000 | ORAL_TABLET | ORAL | Status: DC | PRN
  Administered 2015-07-05 – 2015-07-06 (×3): 2 via ORAL
  Filled 2015-07-05 (×4): qty 2

## 2015-07-05 MED ORDER — HYDROMORPHONE HCL 2 MG PO TABS: 2.0000 mg | ORAL_TABLET | ORAL | Status: DC | PRN

## 2015-07-05 MED ORDER — METHOCARBAMOL 500 MG PO TABS: 500.0000 mg | ORAL_TABLET | Freq: Four times a day (QID) | ORAL | Status: DC | PRN

## 2015-07-05 MED ORDER — WHITE PETROLATUM GEL
Status: AC
  Administered 2015-07-05: 20:00:00
  Filled 2015-07-05: qty 1

## 2015-07-05 MED ORDER — ENOXAPARIN SODIUM 40 MG/0.4ML ~~LOC~~ SOLN: 40.0000 mg | SUBCUTANEOUS | Status: DC

## 2015-07-05 MED ORDER — DOXYCYCLINE HYCLATE 100 MG PO TABS
100.0000 mg | ORAL_TABLET | Freq: Two times a day (BID) | ORAL | Status: DC
  Administered 2015-07-05 – 2015-07-06 (×3): 100 mg via ORAL
  Filled 2015-07-05 (×3): qty 1

## 2015-07-05 MED ORDER — DOCUSATE SODIUM 100 MG PO CAPS: 100.0000 mg | ORAL_CAPSULE | Freq: Every day | ORAL | Status: DC

## 2015-07-05 NOTE — Progress Notes (Signed)
Pt discharged and instructions explained to pt.  Pt returned to floor c/o of not feeling well, feeling SOB and in pain.  Pt had not been removed from the system so had pt return to her room.  VS checked.  WNL.  Dr. Harlow Mares notified and Dr. Ouida Sills.   Both MD's agree with pt staying one more night to manage pain.  Md gave verbal that pt could receive Percocet for pain.  Order placed.  Will continue to monitor.

## 2015-07-05 NOTE — Discharge Summary (Signed)
Physician Discharge Summary  Patient ID: ECKO BANGS MRN: [id number] DOB/AGE: [birth year] 39 y.o.  Admit date: 07/04/2015 Discharge date: 07/05/2015  Admission Diagnoses:Breast cancer and genetic susceptibility to breast cancer  Discharge Diagnoses: Same Active Problems:   Breast cancer [facility])   Discharged Condition: good  Hospital Course: On the day of admission the patient was taken to surgery and had bilateral delayed breast reconstruction with implants and bilateral oophorectomy. The patient tolerated the procedures well. Postoperatively, she was ambulatory and tolerating diet on the first postoperative day. She is ready for discharge.  Treatments: antibiotics: Ancef, anticoagulation: LMW heparin and surgery: bilateral breast reconstruction with silicone gel implants and bilateral oophorectomy.  Discharge Exam: Blood pressure 104/58, pulse 80, temperature 98.6 F (37 C), temperature source Oral, resp. rate 18, height 5' 2.5" (1.588 m), weight 116 lb (52.617 kg), SpO2 100 %.  Operative sites: Mastectomy flaps viable. Implants are in good. Drains functioning. Drainage thin.  Disposition: 01-Home or Self Care     Medication List    STOP taking these medications        azithromycin 250 MG tablet  Commonly known as:  ZITHROMAX     multivitamin with minerals Tabs tablet      TAKE these medications        docusate sodium 100 MG capsule  Commonly known as:  COLACE  Take 1 capsule (100 mg total) by mouth daily.     doxycycline 100 MG tablet  Commonly known as:  VIBRA-TABS  Take 1 tablet (100 mg total) by mouth every 12 (twelve) hours.     enoxaparin 40 MG/0.4ML injection  Commonly known as:  LOVENOX  Inject 0.4 mLs (40 mg total) into the skin daily.     HYDROmorphone 2 MG tablet  Commonly known as:  DILAUDID  Take 1-2 tablets (2-4 mg total) by mouth every 4 (four) hours as needed for moderate pain.     methocarbamol 500 MG tablet  Commonly known as:  ROBAXIN  Take  1 tablet (500 mg total) by mouth every 6 (six) hours as needed for muscle spasms.     PROBIOTIC PO  Take 1 capsule by mouth daily.         SignedHarlow Mares, Isaah Furry M 07/05/2015, 8:29 AM

## 2015-07-05 NOTE — Op Note (Signed)
NAMEJASMEET, Robin Miles                   ACCOUNT NO.:  [id number]  MEDICAL RECORD NO.:  [id number]  LOCATION:  6N22C                        FACILITY:  [facility]  PHYSICIAN:  Freda Munro, M.D.    DATE OF BIRTH:  12/04/1975  DATE OF PROCEDURE: DATE OF DISCHARGE:                              OPERATIVE REPORT   PREOPERATIVE DIAGNOSES: 1. History of ovarian cancer. 2. Patient desires bilateral salpingo-oophorectomy.  POSTOPERATIVE DIAGNOSES: 1. History of ovarian cancer. 2. Patient desires bilateral salpingo-oophorectomy.  PROCEDURE:  Laparoscopic bilateral salpingo-oophorectomy.  SURGEON:  Freda Munro, MD  ASSISTANTCarlis Abbott.  ANESTHESIA:  General.  ANTIBIOTICS:  Ancef 2 g.  DRAINS:  Red rubber catheter bladder.  SPECIMENS:  Right and left fallopian tubes intact, sent to Pathology.  COMPLICATIONS:  None.  ESTIMATED BLOOD LOSS:  Minimal.  FINDINGS:  The patient had normal fallopian tubes and ovaries bilaterally.  The uterus appeared to be normal.  There was no evidence of any kind of bowel adhesions, endometriosis, or abnormalities in the lower or upper abdomen.  PROCEDURE IN DETAIL:  Patient was taken to the operating room by Dr. Harlow Mares where she had breast implants placed.  At the conclusion of this procedure, I was contacted.  Upon arrival to the operating room, the patient was re-prepped and draped.  She was in dorsal lithotomy position.  A sterile speculum was placed in the vagina and a Hulka tenaculum applied.  Her bladder had been drained with a red rubber catheter.  Her umbilicus was then injected with 0.25% Marcaine.  A vertical skin incision was made.  The fascia was grasped and entered with Mayo scissors.  The Hasson cannula was placed into the abdominal cavity.  A 3 L of carbon dioxide was then placed.  The patient then had 5 mm ports placed in the right and left lower quadrants under direct visualization.  Once this was accomplished, the ureters were  identified bilaterally.  The left fallopian tube and ovary were then lifted.  The infundibulopelvic ligament was then cauterized and transected.  The broad ligament was cauterized and transected.  The tube was then cauterized and transected at the fundus.  The ovarian ligament cauterized and transected.  The specimen was then placed in the posterior cul-de-sac.  A similar procedure was performed on the opposite side.  There was some bleeding noted at the fundus which was then cauterized with the bipolar device.  Following this, pelvis was copiously irrigated.  The Endobag was placed in the abdominal cavity. Both specimens were placed into the endobag.  Endobag was then removed through the umbilical port.  The scope was then placed back and no evidence of any bleeding was noted.  All ports were then removed. Pneumoperitoneum was released.  The fascia was closed with 0 Vicryl suture in a pursestring fashion.  The subcuticular tissue was closed with interrupted 3-0 Monocryl suture and Dermabond was used on the skin. The patient tolerated the procedure well.  She was taken to recovery room in stable condition.  Instrument and lap counts were correct x2. She will be discharged to home.  She will follow up in the office in 4 weeks.  ______________________________ Freda Munro, M.D.     MA/MEDQ  D:  07/04/2015  T:  07/05/2015  Job:  [id number]:[id number]

## 2015-07-05 NOTE — Telephone Encounter (Signed)
Lvm advising appt time chg on 07/24/15 to 1.45pm due to [facility].

## 2015-07-06 DIAGNOSIS — C50311 Malignant neoplasm of lower-inner quadrant of right female breast: Secondary | ICD-10-CM | POA: Diagnosis not present

## 2015-07-06 MED ORDER — MUPIROCIN 2 % EX OINT
TOPICAL_OINTMENT | CUTANEOUS | Status: AC
  Administered 2015-07-06: 14:00:00
  Filled 2015-07-06: qty 22

## 2015-07-06 MED ORDER — ALUM & MAG HYDROXIDE-SIMETH 200-200-20 MG/5ML PO SUSP: 30.0000 mL | Freq: Four times a day (QID) | ORAL | Status: DC | PRN

## 2015-07-06 MED ORDER — CALCIUM CARBONATE ANTACID 500 MG PO CHEW
400.0000 mg | CHEWABLE_TABLET | ORAL | Status: DC | PRN
  Administered 2015-07-06 (×2): 400 mg via ORAL
  Filled 2015-07-06 (×2): qty 2

## 2015-07-06 NOTE — Discharge Summary (Signed)
Physician Discharge Summary  Patient ID: Robin Miles MRN: [id number] DOB/AGE: 09-16-1975 39 y.o.  Admit date: 07/04/2015 Discharge date: 07/06/2015  Admission Diagnoses: Breast cancer and genetic susceptibility to breast cancer  Discharge Diagnoses: Same Active Problems:   Breast cancer [facility])   Discharged Condition: good  Hospital Course: On the day of admission the patient was taken to surgery and had bilateral breast reconstruction with implants and oophorectomy. The patient tolerated the procedures well. Postoperatively, the flap maintained excellent color and capillary refill. The patient was ambulatory and tolerating diet on the first postoperative day. She was discharged yesterday but was unable to make it to the car due to abdominal pain. This was likely due to retained intraabdominal gas post laparoscopic surgery. She feels much better today.  Treatments: antibiotics: Ancef, anticoagulation: LMW heparin and surgery: See summary from yesterday.  Discharge Exam: Blood pressure 99/58, pulse 80, temperature 98.2 F (36.8 C), temperature source Oral, resp. rate 18, height 5' 2.5" (1.588 m), weight 116 lb (52.617 kg), SpO2 100 %.  Operative sites: Mastectomy flaps viable. Implants are in good position. Drains functioning. Drainage thin and minimal ((less than 30 cc each for 24 hours for two days). Drains removed.  Disposition: 01-Home or Self Care     Medication List    STOP taking these medications        azithromycin 250 MG tablet  Commonly known as:  ZITHROMAX     multivitamin with minerals Tabs tablet      TAKE these medications        docusate sodium 100 MG capsule  Commonly known as:  COLACE  Take 1 capsule (100 mg total) by mouth daily.     doxycycline 100 MG tablet  Commonly known as:  VIBRA-TABS  Take 1 tablet (100 mg total) by mouth every 12 (twelve) hours.     enoxaparin 40 MG/0.4ML injection  Commonly known as:  LOVENOX  Inject 0.4 mLs (40 mg total)  into the skin daily.     HYDROmorphone 2 MG tablet  Commonly known as:  DILAUDID  Take 1-2 tablets (2-4 mg total) by mouth every 4 (four) hours as needed for moderate pain.     methocarbamol 500 MG tablet  Commonly known as:  ROBAXIN  Take 1 tablet (500 mg total) by mouth every 6 (six) hours as needed for muscle spasms.     PROBIOTIC PO  Take 1 capsule by mouth daily.         SignedHarlow Mares, Metzli Pollick M 07/06/2015, 1:15 PM

## 2015-07-06 NOTE — Discharge Instructions (Addendum)
No lifting for 6 weeks No vigorous activity for 6 weeks (including outdoor walks) No driving for 4 weeks OK to walk up stairs slowly Stay propped up Use incentive spirometer at home every hour while awake May shower tomorrow if okay with Dr. Ouida Sills Take an over-the-counter Probiotic while on antibiotics Take an over-the-counter stool softener (such as Colace) while on pain medication See Dr. Harlow Mares the week after Christmas For questions call [phone] or [phone]

## 2015-07-06 NOTE — Progress Notes (Signed)
Pt discharged home in stable condition. Drains removed pre discharge. No concerns voiced

## 2015-07-06 NOTE — Progress Notes (Signed)
Dr Harlow Mares reviewed pt and removed both JP drains prior to discharging pt

## 2015-07-23 ENCOUNTER — Other Ambulatory Visit: Payer: Self-pay

## 2015-07-23 DIAGNOSIS — C50111 Malignant neoplasm of central portion of right female breast: Secondary | ICD-10-CM

## 2015-07-23 DIAGNOSIS — Z1502 Genetic susceptibility to malignant neoplasm of ovary: Secondary | ICD-10-CM

## 2015-07-23 DIAGNOSIS — C50919 Malignant neoplasm of unspecified site of unspecified female breast: Secondary | ICD-10-CM

## 2015-07-23 DIAGNOSIS — Z1589 Genetic susceptibility to other disease: Secondary | ICD-10-CM

## 2015-07-23 DIAGNOSIS — Z1509 Genetic susceptibility to other malignant neoplasm: Secondary | ICD-10-CM

## 2015-07-24 ENCOUNTER — Encounter: Payer: Self-pay | Admitting: Hematology and Oncology

## 2015-07-24 ENCOUNTER — Ambulatory Visit (HOSPITAL_BASED_OUTPATIENT_CLINIC_OR_DEPARTMENT_OTHER): Payer: Managed Care, Other (non HMO)

## 2015-07-24 ENCOUNTER — Telehealth: Payer: Self-pay | Admitting: Hematology and Oncology

## 2015-07-24 ENCOUNTER — Other Ambulatory Visit (HOSPITAL_BASED_OUTPATIENT_CLINIC_OR_DEPARTMENT_OTHER): Payer: Managed Care, Other (non HMO)

## 2015-07-24 ENCOUNTER — Ambulatory Visit (HOSPITAL_BASED_OUTPATIENT_CLINIC_OR_DEPARTMENT_OTHER): Payer: Managed Care, Other (non HMO) | Admitting: Hematology and Oncology

## 2015-07-24 VITALS — BP 98/53 | HR 80 | Temp 97.7°F | Resp 18 | Ht 62.5 in | Wt 118.0 lb

## 2015-07-24 DIAGNOSIS — C50919 Malignant neoplasm of unspecified site of unspecified female breast: Secondary | ICD-10-CM

## 2015-07-24 DIAGNOSIS — Z1502 Genetic susceptibility to malignant neoplasm of ovary: Secondary | ICD-10-CM

## 2015-07-24 DIAGNOSIS — D709 Neutropenia, unspecified: Secondary | ICD-10-CM

## 2015-07-24 DIAGNOSIS — C50111 Malignant neoplasm of central portion of right female breast: Secondary | ICD-10-CM

## 2015-07-24 DIAGNOSIS — Z5112 Encounter for antineoplastic immunotherapy: Secondary | ICD-10-CM

## 2015-07-24 DIAGNOSIS — Z1509 Genetic susceptibility to other malignant neoplasm: Secondary | ICD-10-CM

## 2015-07-24 DIAGNOSIS — Z1589 Genetic susceptibility to other disease: Secondary | ICD-10-CM

## 2015-07-24 DIAGNOSIS — F419 Anxiety disorder, unspecified: Secondary | ICD-10-CM | POA: Diagnosis not present

## 2015-07-24 LAB — CBC WITH DIFFERENTIAL/PLATELET
BASO%: 0.1 % (ref 0.0–2.0)
Basophils Absolute: 0 10e3/uL (ref 0.0–0.1)
EOS%: 3.4 % (ref 0.0–7.0)
Eosinophils Absolute: 0.3 10e3/uL (ref 0.0–0.5)
HCT: 37.1 % (ref 34.8–46.6)
HGB: 12.2 g/dL (ref 11.6–15.9)
LYMPH%: 38.5 % (ref 14.0–49.7)
MCH: 27.4 pg (ref 25.1–34.0)
MCHC: 32.9 g/dL (ref 31.5–36.0)
MCV: 83.1 fL (ref 79.5–101.0)
MONO#: 0.4 10e3/uL (ref 0.1–0.9)
MONO%: 3.8 % (ref 0.0–14.0)
NEUT#: 5.1 10e3/uL (ref 1.5–6.5)
NEUT%: 54.2 % (ref 38.4–76.8)
Platelets: 276 10e3/uL (ref 145–400)
RBC: 4.46 10e6/uL (ref 3.70–5.45)
RDW: 14.3 % (ref 11.2–14.5)
WBC: 9.3 10e3/uL (ref 3.9–10.3)
lymph#: 3.6 10e3/uL — ABNORMAL HIGH (ref 0.9–3.3)

## 2015-07-24 LAB — COMPREHENSIVE METABOLIC PANEL WITH GFR
ALT: 13 U/L (ref 0–55)
AST: 15 U/L (ref 5–34)
Albumin: 4.2 g/dL (ref 3.5–5.0)
Alkaline Phosphatase: 94 U/L (ref 40–150)
Anion Gap: 9 meq/L (ref 3–11)
BUN: 20.4 mg/dL (ref 7.0–26.0)
CO2: 29 meq/L (ref 22–29)
Calcium: 10.1 mg/dL (ref 8.4–10.4)
Chloride: 106 meq/L (ref 98–109)
Creatinine: 0.9 mg/dL (ref 0.6–1.1)
EGFR: 75 ml/min/1.73 m2 — ABNORMAL LOW
Glucose: 94 mg/dL (ref 70–140)
Potassium: 3.8 meq/L (ref 3.5–5.1)
Sodium: 144 meq/L (ref 136–145)
Total Bilirubin: 0.37 mg/dL (ref 0.20–1.20)
Total Protein: 7.2 g/dL (ref 6.4–8.3)

## 2015-07-24 MED ORDER — HEPARIN SOD (PORK) LOCK FLUSH 100 UNIT/ML IV SOLN
500.0000 [IU] | Freq: Once | INTRAVENOUS | Status: AC | PRN
  Administered 2015-07-24: 500 [IU]
  Filled 2015-07-24: qty 5

## 2015-07-24 MED ORDER — ACETAMINOPHEN 325 MG PO TABS
650.0000 mg | ORAL_TABLET | Freq: Once | ORAL | Status: AC
  Administered 2015-07-24: 650 mg via ORAL

## 2015-07-24 MED ORDER — GABAPENTIN 100 MG PO CAPS: 100.0000 mg | ORAL_CAPSULE | Freq: Every day | ORAL | Status: DC

## 2015-07-24 MED ORDER — ACETAMINOPHEN 325 MG PO TABS
ORAL_TABLET | ORAL | Status: AC
  Filled 2015-07-24: qty 2

## 2015-07-24 MED ORDER — SODIUM CHLORIDE 0.9 % IJ SOLN
10.0000 mL | INTRAMUSCULAR | Status: DC | PRN
  Administered 2015-07-24: 10 mL
  Filled 2015-07-24: qty 10

## 2015-07-24 MED ORDER — DIPHENHYDRAMINE HCL 25 MG PO CAPS: 25.0000 mg | ORAL_CAPSULE | Freq: Once | ORAL | Status: DC

## 2015-07-24 MED ORDER — TRASTUZUMAB CHEMO INJECTION 440 MG
6.0000 mg/kg | Freq: Once | INTRAVENOUS | Status: AC
  Administered 2015-07-24: 315 mg via INTRAVENOUS
  Filled 2015-07-24: qty 15

## 2015-07-24 MED ORDER — SODIUM CHLORIDE 0.9 % IV SOLN
Freq: Once | INTRAVENOUS | Status: AC
  Administered 2015-07-24: 15:00:00 via INTRAVENOUS

## 2015-07-24 NOTE — Assessment & Plan Note (Addendum)
Right breast retroareolar mass 3.7 x 2.8 x 1.7 cm involving anterior two thirds of the breast, T2 N0 M0 stage II a clinical stage, ER 0%, appears 0%, HER-2 positive ratio 3.53, PALB 2 mutation, completed neo-adjuvant chemotherapy with [place] started 12/04/2014 and completed 03/20/2015  Right Mastectomy 04/17/15: Microscopic focus of DCIS 0.1 cm 0/2 LN Neg. No invasive cancer. Path CR; Left Mastectomy: Neg   Recommendation: Continue Herceptin maintenance for 1 year   Patient plans to get oophorectomy She is also getting enrolled in a surveillance clinical trial through [facility] for pancreatic cancer surveillance She is paranoid about every symptom that she has and wonders if it is related to any underlying malignancy. Left upper quadrant abdomen tender spot: No palpable abnormalities.  Arthritis in her knees and hips WBC changes: We noticed that the neutrophil count has decreased to 1.6.  Echocardiogram 06/27/2015: EF 55-60% RTC every 3 weeks for Herceptin and q 6 weeks for clinic follow ups

## 2015-07-24 NOTE — Patient Instructions (Signed)
[facility] Discharge Instructions for Patients Receiving Chemotherapy  Today you received the following chemotherapy agent: Herceptin   To help prevent nausea and vomiting after your treatment, we encourage you to take your nausea medication as prescribed.    If you develop nausea and vomiting that is not controlled by your nausea medication, call the clinic.   BELOW ARE SYMPTOMS THAT SHOULD BE REPORTED IMMEDIATELY:  *FEVER GREATER THAN 100.5 F  *CHILLS WITH OR WITHOUT FEVER  NAUSEA AND VOMITING THAT IS NOT CONTROLLED WITH YOUR NAUSEA MEDICATION  *UNUSUAL SHORTNESS OF BREATH  *UNUSUAL BRUISING OR BLEEDING  TENDERNESS IN MOUTH AND THROAT WITH OR WITHOUT PRESENCE OF ULCERS  *URINARY PROBLEMS  *BOWEL PROBLEMS  UNUSUAL RASH Items with * indicate a potential emergency and should be followed up as soon as possible.  Feel free to call the clinic you have any questions or concerns. The clinic phone number is [phone].  Please show the CHEMO ALERT CARD at check-in to the Emergency Department and triage nurse.   

## 2015-07-24 NOTE — Progress Notes (Signed)
Patient Care Team: No Pcp Per Patient as PCP - General (General Practice)  DIAGNOSIS: No matching staging information was found for the patient.  SUMMARY OF ONCOLOGIC HISTORY:   Cancer of central portion of right female breast ([place])   11/14/2014 Initial Diagnosis Right breast biopsy 8:30 position: Invasive ductal carcinoma with DCIS, grade 2, ER 0%, PR 0%, HER-2 positive ratio 3.53   11/20/2014 Breast MRI Right breast: 3.7 x 2.8 x 1.7 cm area of non-mass enhancement and retroareolar right breast into the lower outer quadrant and lower inner quadrant extending into the nipple involving anterior two thirds of the breast, no lymph nodes   12/04/2014 -  Neo-Adjuvant Chemotherapy Taxotere, carboplatin, Herceptin, Perjeta 6 cycles followed by Herceptin maintenance for 1 year   12/19/2014 Procedure PALB2 Mutation   04/17/2015 Surgery Right Mastectomy: Microscopic focus of DCIS 0.1 cm 0/2 LN Neg. No invasive cancer. Path CR;  Left Mastectomy: Neg   07/04/2015 Surgery Bilateral salpingo-oophorectomy    CHIEF COMPLIANT: follow-up on Herceptin  INTERVAL HISTORY: Robin Miles is a 40 year old with above-mentioned history of right breast cancer currently on Herceptin maintenance. She had undergone bilateral salpingo-oophorectomy and since then she has been having hot flashes. She was discharged to receive medication to help with these hot flashes. She continues to be stressed out and does not sleep very well.  REVIEW OF SYSTEMS:   Constitutional: Denies fevers, chills or abnormal weight loss Eyes: Denies blurriness of vision Ears, nose, mouth, throat, and face: Denies mucositis or sore throat Respiratory: Denies cough, dyspnea or wheezes Cardiovascular: Denies palpitation, chest discomfort Gastrointestinal:  Denies nausea, heartburn or change in bowel habits Skin: Denies abnormal skin rashes Lymphatics: Denies new lymphadenopathy or easy bruising Neurological:Denies numbness, tingling or new  weaknesses Behavioral/Psych: Mood is stable, no new changes  Extremities: No lower extremity edema Breast:  denies any pain or lumps or nodules in either breasts All other systems were reviewed with the patient and are negative.  I have reviewed the past medical history, past surgical history, social history and family history with the patient and they are unchanged from previous note.  ALLERGIES:  is allergic to compazine.  MEDICATIONS:  Current Outpatient Prescriptions  Medication Sig Dispense Refill  . gabapentin (NEURONTIN) 100 MG capsule Take 1 capsule (100 mg total) by mouth at bedtime. 30 capsule 3  . Probiotic Product (PROBIOTIC PO) Take 1 capsule by mouth daily.     No current facility-administered medications for this visit.   Facility-Administered Medications Ordered in Other Visits  Medication Dose Route Frequency Provider Last Rate Last Dose  . diphenhydrAMINE (BENADRYL) capsule 25 mg  25 mg Oral Once Nicholas Lose, MD   25 mg at 07/24/15 1512  . sodium chloride 0.9 % injection 10 mL  10 mL Intracatheter PRN Nicholas Lose, MD   10 mL at 07/24/15 1618    PHYSICAL EXAMINATION: ECOG PERFORMANCE STATUS: 1 - Symptomatic but completely ambulatory  Filed Vitals:   07/24/15 1411  BP: 98/53  Pulse: 80  Temp: 97.7 F (36.5 C)  Resp: 18   Filed Weights   07/24/15 1411  Weight: 118 lb (53.524 kg)    GENERAL:alert, no distress and comfortable SKIN: skin color, texture, turgor are normal, no rashes or significant lesions EYES: normal, Conjunctiva are pink and non-injected, sclera clear OROPHARYNX:no exudate, no erythema and lips, buccal mucosa, and tongue normal  NECK: supple, thyroid normal size, non-tender, without nodularity LYMPH:  no palpable lymphadenopathy in the cervical, axillary or inguinal LUNGS:  clear to auscultation and percussion with normal breathing effort HEART: regular rate & rhythm and no murmurs and no lower extremity edema ABDOMEN:abdomen soft,  non-tender and normal bowel sounds MUSCULOSKELETAL:no cyanosis of digits and no clubbing  NEURO: alert & oriented x 3 with fluent speech, no focal motor/sensory deficits EXTREMITIES: No lower extremity edema  LABORATORY DATA:  I have reviewed the data as listed   Chemistry      Component Value Date/Time   NA 144 07/24/2015 1353   NA 141 06/27/2015 1040   K 3.8 07/24/2015 1353   K 4.4 06/27/2015 1040   CL 105 06/27/2015 1040   CO2 29 07/24/2015 1353   CO2 27 06/27/2015 1040   BUN 20.4 07/24/2015 1353   BUN 15 06/27/2015 1040   CREATININE 0.9 07/24/2015 1353   CREATININE 0.96 06/27/2015 1040      Component Value Date/Time   CALCIUM 10.1 07/24/2015 1353   CALCIUM 10.0 06/27/2015 1040   ALKPHOS 94 07/24/2015 1353   ALKPHOS 87 01/02/2015 2217   AST 15 07/24/2015 1353   AST 16 01/02/2015 2217   ALT 13 07/24/2015 1353   ALT 19 01/02/2015 2217   BILITOT 0.37 07/24/2015 1353   BILITOT 1.5* 01/02/2015 2217       Lab Results  Component Value Date   WBC 9.3 07/24/2015   HGB 12.2 07/24/2015   HCT 37.1 07/24/2015   MCV 83.1 07/24/2015   PLT 276 07/24/2015   NEUTROABS 5.1 07/24/2015   ASSESSMENT & PLAN:  Cancer of central portion of right female breast Right breast retroareolar mass 3.7 x 2.8 x 1.7 cm involving anterior two thirds of the breast, T2 N0 M0 stage II a clinical stage, ER 0%, appears 0%, HER-2 positive ratio 3.53, PALB 2 mutation, completed neo-adjuvant chemotherapy with [facility] Perjeta started 12/04/2014 and completed 03/20/2015  Right Mastectomy 04/17/15: Microscopic focus of DCIS 0.1 cm 0/2 LN Neg. No invasive cancer. Path CR; Left Mastectomy: Neg   Recommendation: Continue Herceptin maintenance for 1 year  She is also getting enrolled in a surveillance clinical trial through [facility] for pancreatic cancer surveillance She is paranoid about every symptom that she has and wonders if it is related to any underlying malignancy. Left upper quadrant abdomen tender spot: No  palpable abnormalities.  Arthritis in her knees and hips WBC changes: We noticed that the neutrophil count has decreased to 1.6 previously and now her [place] is normal at 5.1 Severe anxiety and stress: I recommended guided imagery. Severe hot flashes since she got oophorectomy: I prescribed her Neurontin 100 mg at bedtime.  Echocardiogram 06/27/2015: EF 55-60% RTC every 3 weeks for Herceptin and q 6 weeks for clinic follow ups   Orders Placed This Encounter  Procedures  . Ambulatory referral to [facility]    Referral Priority:  Routine    Referral Type:  Consultation    Referral Reason:  Specialty Services Required    Requested Specialty:  Alternative Medicine    Number of Visits Requested:  1   The patient has a good understanding of the overall plan. she agrees with it. she will call with any problems that may develop before the next visit here.   Rulon Eisenmenger, MD 07/24/2015

## 2015-07-24 NOTE — Telephone Encounter (Signed)
Appointments made and avs printed for patient °

## 2015-07-24 NOTE — Addendum Note (Signed)
Addended by: Prentiss Bells on: 07/24/2015 07:13 PM   Modules accepted: Medications

## 2015-07-26 ENCOUNTER — Telehealth: Payer: Self-pay | Admitting: Hematology and Oncology

## 2015-07-26 NOTE — Telephone Encounter (Signed)
Called [facility] per terri at [phone] and they request that i fax all patient info to them at [phone] and they will call the patient

## 2015-08-09 ENCOUNTER — Encounter: Payer: Self-pay | Admitting: *Deleted

## 2015-08-09 NOTE — Progress Notes (Signed)
Received office notes from [facility], reviewed by Dr. Lindi Adie, sent to scan.

## 2015-08-14 ENCOUNTER — Other Ambulatory Visit: Payer: BLUE CROSS/BLUE SHIELD

## 2015-08-14 ENCOUNTER — Ambulatory Visit (HOSPITAL_BASED_OUTPATIENT_CLINIC_OR_DEPARTMENT_OTHER): Payer: Managed Care, Other (non HMO)

## 2015-08-14 VITALS — BP 108/62 | HR 65 | Temp 98.3°F | Resp 18

## 2015-08-14 DIAGNOSIS — C50111 Malignant neoplasm of central portion of right female breast: Secondary | ICD-10-CM

## 2015-08-14 DIAGNOSIS — Z5112 Encounter for antineoplastic immunotherapy: Secondary | ICD-10-CM

## 2015-08-14 MED ORDER — SODIUM CHLORIDE 0.9 % IV SOLN
Freq: Once | INTRAVENOUS | Status: AC
  Administered 2015-08-14: 10:00:00 via INTRAVENOUS

## 2015-08-14 MED ORDER — HEPARIN SOD (PORK) LOCK FLUSH 100 UNIT/ML IV SOLN
500.0000 [IU] | Freq: Once | INTRAVENOUS | Status: AC | PRN
  Administered 2015-08-14: 500 [IU]
  Filled 2015-08-14: qty 5

## 2015-08-14 MED ORDER — DIPHENHYDRAMINE HCL 25 MG PO CAPS
25.0000 mg | ORAL_CAPSULE | Freq: Once | ORAL | Status: DC
Start: 2015-08-14 — End: 2015-08-14

## 2015-08-14 MED ORDER — ACETAMINOPHEN 325 MG PO TABS
650.0000 mg | ORAL_TABLET | Freq: Once | ORAL | Status: AC
  Administered 2015-08-14: 650 mg via ORAL

## 2015-08-14 MED ORDER — SODIUM CHLORIDE 0.9 % IJ SOLN
10.0000 mL | INTRAMUSCULAR | Status: DC | PRN
  Administered 2015-08-14: 10 mL
  Filled 2015-08-14: qty 10

## 2015-08-14 MED ORDER — TRASTUZUMAB CHEMO INJECTION 440 MG
6.0000 mg/kg | Freq: Once | INTRAVENOUS | Status: AC
  Administered 2015-08-14: 315 mg via INTRAVENOUS
  Filled 2015-08-14: qty 15

## 2015-08-14 MED ORDER — ACETAMINOPHEN 325 MG PO TABS
ORAL_TABLET | ORAL | Status: AC
  Filled 2015-08-14: qty 2

## 2015-08-14 NOTE — Patient Instructions (Signed)
[facility] Discharge Instructions for Patients Receiving Chemotherapy  Today you received the following chemotherapy agent: Herceptin   To help prevent nausea and vomiting after your treatment, we encourage you to take your nausea medication as prescribed.    If you develop nausea and vomiting that is not controlled by your nausea medication, call the clinic.   BELOW ARE SYMPTOMS THAT SHOULD BE REPORTED IMMEDIATELY:  *FEVER GREATER THAN 100.5 F  *CHILLS WITH OR WITHOUT FEVER  NAUSEA AND VOMITING THAT IS NOT CONTROLLED WITH YOUR NAUSEA MEDICATION  *UNUSUAL SHORTNESS OF BREATH  *UNUSUAL BRUISING OR BLEEDING  TENDERNESS IN MOUTH AND THROAT WITH OR WITHOUT PRESENCE OF ULCERS  *URINARY PROBLEMS  *BOWEL PROBLEMS  UNUSUAL RASH Items with * indicate a potential emergency and should be followed up as soon as possible.  Feel free to call the clinic you have any questions or concerns. The clinic phone number is [phone].  Please show the CHEMO ALERT CARD at check-in to the Emergency Department and triage nurse.   

## 2015-08-15 NOTE — Progress Notes (Signed)
Bertram Denver, RN Case Manage with [facility], would like the pt to contact her at [phone] x [phone].

## 2015-08-22 ENCOUNTER — Telehealth: Payer: Self-pay | Admitting: Genetic Counselor

## 2015-08-22 NOTE — Telephone Encounter (Signed)
FAXED RECORDS REQUEST TO [facility]

## 2015-08-27 ENCOUNTER — Encounter: Payer: Self-pay | Admitting: *Deleted

## 2015-08-27 NOTE — Progress Notes (Signed)
Received office notes from [facility], reviewed by Dr. Lindi Adie, sent to scan.

## 2015-09-03 NOTE — Assessment & Plan Note (Signed)
Right breast retroareolar mass 3.7 x 2.8 x 1.7 cm involving anterior two thirds of the breast, T2 N0 M0 stage II a clinical stage, ER 0%, appears 0%, HER-2 positive ratio 3.53, PALB 2 mutation, completed neo-adjuvant chemotherapy with [place] started 12/04/2014 and completed 03/20/2015  Right Mastectomy 04/17/15: Microscopic focus of DCIS 0.1 cm 0/2 LN Neg. No invasive cancer. Path CR; Left Mastectomy: Neg  Recommendation: Continue Herceptin maintenance for 1 year   Arthritis in her knees and hips  WBC changes: We noticed that the neutrophil count has decreased to 1.6 previously and now her [place] is normal at 5.1  Severe anxiety and stress: I recommended guided imagery.  Severe hot flashes since she got oophorectomy: I prescribed her Neurontin 100 mg at bedtime.  Echocardiogram 06/27/2015: EF 55-60%  RTC every 3 weeks for Herceptin and q 6 weeks for clinic follow ups

## 2015-09-04 ENCOUNTER — Ambulatory Visit (HOSPITAL_BASED_OUTPATIENT_CLINIC_OR_DEPARTMENT_OTHER): Payer: Managed Care, Other (non HMO) | Admitting: Hematology and Oncology

## 2015-09-04 ENCOUNTER — Encounter: Payer: Self-pay | Admitting: Hematology and Oncology

## 2015-09-04 ENCOUNTER — Ambulatory Visit (HOSPITAL_BASED_OUTPATIENT_CLINIC_OR_DEPARTMENT_OTHER): Payer: Managed Care, Other (non HMO)

## 2015-09-04 ENCOUNTER — Other Ambulatory Visit (HOSPITAL_BASED_OUTPATIENT_CLINIC_OR_DEPARTMENT_OTHER): Payer: Managed Care, Other (non HMO)

## 2015-09-04 ENCOUNTER — Telehealth: Payer: Self-pay | Admitting: Hematology and Oncology

## 2015-09-04 VITALS — BP 108/69 | HR 65 | Temp 98.6°F | Resp 18 | Wt 117.5 lb

## 2015-09-04 DIAGNOSIS — Z1589 Genetic susceptibility to other disease: Secondary | ICD-10-CM

## 2015-09-04 DIAGNOSIS — Z5112 Encounter for antineoplastic immunotherapy: Secondary | ICD-10-CM | POA: Diagnosis not present

## 2015-09-04 DIAGNOSIS — C50111 Malignant neoplasm of central portion of right female breast: Secondary | ICD-10-CM

## 2015-09-04 DIAGNOSIS — C50919 Malignant neoplasm of unspecified site of unspecified female breast: Secondary | ICD-10-CM

## 2015-09-04 DIAGNOSIS — D709 Neutropenia, unspecified: Secondary | ICD-10-CM | POA: Diagnosis not present

## 2015-09-04 DIAGNOSIS — Z1509 Genetic susceptibility to other malignant neoplasm: Secondary | ICD-10-CM

## 2015-09-04 DIAGNOSIS — Z1502 Genetic susceptibility to malignant neoplasm of ovary: Secondary | ICD-10-CM

## 2015-09-04 LAB — CBC WITH DIFFERENTIAL/PLATELET
BASO%: 0.7 % (ref 0.0–2.0)
BASOS ABS: 0.1 10*3/uL (ref 0.0–0.1)
EOS%: 1.7 % (ref 0.0–7.0)
Eosinophils Absolute: 0.1 10*3/uL (ref 0.0–0.5)
HEMATOCRIT: 38.1 % (ref 34.8–46.6)
HEMOGLOBIN: 12.5 g/dL (ref 11.6–15.9)
LYMPH#: 2.8 10*3/uL (ref 0.9–3.3)
LYMPH%: 39.2 % (ref 14.0–49.7)
MCH: 27.2 pg (ref 25.1–34.0)
MCHC: 32.8 g/dL (ref 31.5–36.0)
MCV: 83.1 fL (ref 79.5–101.0)
MONO#: 0.4 10*3/uL (ref 0.1–0.9)
MONO%: 5.5 % (ref 0.0–14.0)
NEUT#: 3.8 10*3/uL (ref 1.5–6.5)
NEUT%: 52.9 % (ref 38.4–76.8)
PLATELETS: 325 10*3/uL (ref 145–400)
RBC: 4.59 10*6/uL (ref 3.70–5.45)
RDW: 15.8 % — AB (ref 11.2–14.5)
WBC: 7.1 10*3/uL (ref 3.9–10.3)

## 2015-09-04 LAB — COMPREHENSIVE METABOLIC PANEL
ALBUMIN: 3.9 g/dL (ref 3.5–5.0)
ALK PHOS: 96 U/L (ref 40–150)
ALT: 11 U/L (ref 0–55)
ANION GAP: 10 meq/L (ref 3–11)
AST: 12 U/L (ref 5–34)
BUN: 15.1 mg/dL (ref 7.0–26.0)
CALCIUM: 10.1 mg/dL (ref 8.4–10.4)
CO2: 28 mEq/L (ref 22–29)
Chloride: 106 mEq/L (ref 98–109)
Creatinine: 0.9 mg/dL (ref 0.6–1.1)
EGFR: 75 mL/min/{1.73_m2} — ABNORMAL LOW (ref >90)
Glucose: 98 mg/dl (ref 70–140)
POTASSIUM: 4 meq/L (ref 3.5–5.1)
Sodium: 143 mEq/L (ref 136–145)
Total Bilirubin: 0.43 mg/dL (ref 0.20–1.20)
Total Protein: 7.1 g/dL (ref 6.4–8.3)

## 2015-09-04 MED ORDER — DIPHENHYDRAMINE HCL 25 MG PO CAPS: 25.0000 mg | ORAL_CAPSULE | Freq: Once | ORAL | Status: DC

## 2015-09-04 MED ORDER — ACETAMINOPHEN 325 MG PO TABS
ORAL_TABLET | ORAL | Status: AC
  Filled 2015-09-04: qty 2

## 2015-09-04 MED ORDER — HEPARIN SOD (PORK) LOCK FLUSH 100 UNIT/ML IV SOLN
500.0000 [IU] | Freq: Once | INTRAVENOUS | Status: AC | PRN
  Administered 2015-09-04: 500 [IU]
  Filled 2015-09-04: qty 5

## 2015-09-04 MED ORDER — ACETAMINOPHEN 325 MG PO TABS
650.0000 mg | ORAL_TABLET | Freq: Once | ORAL | Status: AC
  Administered 2015-09-04: 650 mg via ORAL

## 2015-09-04 MED ORDER — TRASTUZUMAB CHEMO INJECTION 440 MG
6.0000 mg/kg | Freq: Once | INTRAVENOUS | Status: AC
  Administered 2015-09-04: 315 mg via INTRAVENOUS
  Filled 2015-09-04: qty 15

## 2015-09-04 MED ORDER — SODIUM CHLORIDE 0.9 % IV SOLN
Freq: Once | INTRAVENOUS | Status: AC
  Administered 2015-09-04: 15:00:00 via INTRAVENOUS

## 2015-09-04 MED ORDER — SODIUM CHLORIDE 0.9 % IJ SOLN
10.0000 mL | INTRAMUSCULAR | Status: DC | PRN
  Administered 2015-09-04: 10 mL
  Filled 2015-09-04: qty 10

## 2015-09-04 NOTE — Telephone Encounter (Signed)
Patient alreaduy on schedule for lab/fu/tx 6 weeks (3/29) and also tx for 3/8. No other orders per 2/15 pof.

## 2015-09-04 NOTE — Patient Instructions (Signed)
[facility] Discharge Instructions for Patients Receiving Chemotherapy  Today you received the following chemotherapy agent: Herceptin   To help prevent nausea and vomiting after your treatment, we encourage you to take your nausea medication as prescribed.    If you develop nausea and vomiting that is not controlled by your nausea medication, call the clinic.   BELOW ARE SYMPTOMS THAT SHOULD BE REPORTED IMMEDIATELY:  *FEVER GREATER THAN 100.5 F  *CHILLS WITH OR WITHOUT FEVER  NAUSEA AND VOMITING THAT IS NOT CONTROLLED WITH YOUR NAUSEA MEDICATION  *UNUSUAL SHORTNESS OF BREATH  *UNUSUAL BRUISING OR BLEEDING  TENDERNESS IN MOUTH AND THROAT WITH OR WITHOUT PRESENCE OF ULCERS  *URINARY PROBLEMS  *BOWEL PROBLEMS  UNUSUAL RASH Items with * indicate a potential emergency and should be followed up as soon as possible.  Feel free to call the clinic you have any questions or concerns. The clinic phone number is [phone].  Please show the CHEMO ALERT CARD at check-in to the Emergency Department and triage nurse.   

## 2015-09-04 NOTE — Progress Notes (Signed)
Patient Care Team: No Pcp Per Patient as PCP - General (General Practice)  SUMMARY OF ONCOLOGIC HISTORY:   Cancer of central portion of right female breast ([place])   11/14/2014 Initial Diagnosis Right breast biopsy 8:30 position: Invasive ductal carcinoma with DCIS, grade 2, ER 0%, PR 0%, HER-2 positive ratio 3.53   11/20/2014 Breast MRI Right breast: 3.7 x 2.8 x 1.7 cm area of non-mass enhancement and retroareolar right breast into the lower outer quadrant and lower inner quadrant extending into the nipple involving anterior two thirds of the breast, no lymph nodes   12/04/2014 -  Neo-Adjuvant Chemotherapy Taxotere, carboplatin, Herceptin, Perjeta 6 cycles followed by Herceptin maintenance for 1 year   12/19/2014 Procedure PALB2 Mutation   04/17/2015 Surgery Right Mastectomy: Microscopic focus of DCIS 0.1 cm 0/2 LN Neg. No invasive cancer. Path CR;  Left Mastectomy: Neg   07/04/2015 Surgery Bilateral salpingo-oophorectomy    CHIEF COMPLIANT: Herceptin maintenance therapy  INTERVAL HISTORY: Robin Miles is a 40 year old with above-mentioned history of  [id number] positive right breast cancer who underwent neoadjuvant chemotherapy followed by bilateral mastectomies and bilateral salpingo-oophorectomy. She had a pathologic complete response. She is currently on Herceptin maintenance appears to be doing quite well. She is always anxious and worried about recurrence and was trying to investigate if she would be eligible to participate in a vaccine clinical trial. According to my review she is not eligible to participate in it.[facility] She was also not a candidate for that trial.  REVIEW OF SYSTEMS:   Constitutional: Denies fevers, chills or abnormal weight loss Eyes: Denies blurriness of vision Ears, nose, mouth, throat, and face: Denies mucositis or sore throat Respiratory: Denies cough, dyspnea or  wheezes Cardiovascular: Denies palpitation, chest discomfort Gastrointestinal:  Denies nausea, heartburn or change in bowel habits Skin: Denies abnormal skin rashes Lymphatics: Denies new lymphadenopathy or easy bruising Neurological:Denies numbness, tingling or new weaknesses Behavioral/Psych: Mood is stable, no new changes  Extremities: No lower extremity edema Breast:  denies any pain or lumps or nodules in either breasts All other systems were reviewed with the patient and are negative.  I have reviewed the past medical history, past surgical history, social history and family history with the patient and they are unchanged from previous note.  ALLERGIES:  is allergic to compazine.  MEDICATIONS:  Current Outpatient Prescriptions  Medication Sig Dispense Refill  . gabapentin (NEURONTIN) 100 MG capsule Take 1 capsule (100 mg total) by mouth at bedtime. 30 capsule 3  . Probiotic Product (PROBIOTIC PO) Take 1 capsule by mouth daily.     No current facility-administered medications for this visit.    PHYSICAL EXAMINATION: ECOG PERFORMANCE STATUS: 0 - Asymptomatic  Filed Vitals:   09/04/15 1342  BP: 108/69  Pulse: 65  Temp: 98.6 F (37 C)  Resp: 18   Filed Weights   09/04/15 1342  Weight: 117 lb 8 oz (53.298 kg)    GENERAL:alert, no distress and comfortable SKIN: skin color, texture, turgor are normal, no rashes or significant lesions EYES: normal, Conjunctiva are pink and non-injected, sclera clear OROPHARYNX:no exudate, no erythema and lips, buccal mucosa, and tongue normal  NECK: supple, thyroid normal size, non-tender, without nodularity LYMPH:  no palpable lymphadenopathy in the cervical, axillary or inguinal LUNGS: clear to auscultation and percussion with normal breathing effort HEART: regular rate & rhythm and no murmurs and no lower extremity edema ABDOMEN:abdomen soft, non-tender and  normal bowel sounds MUSCULOSKELETAL:no cyanosis of digits and no clubbing    NEURO: alert & oriented x 3 with fluent speech, no focal motor/sensory deficits EXTREMITIES: No lower extremity edema  LABORATORY DATA:  I have reviewed the data as listed   Chemistry      Component Value Date/Time   NA 144 07/24/2015 1353   NA 141 06/27/2015 1040   K 3.8 07/24/2015 1353   K 4.4 06/27/2015 1040   CL 105 06/27/2015 1040   CO2 29 07/24/2015 1353   CO2 27 06/27/2015 1040   BUN 20.4 07/24/2015 1353   BUN 15 06/27/2015 1040   CREATININE 0.9 07/24/2015 1353   CREATININE 0.96 06/27/2015 1040      Component Value Date/Time   CALCIUM 10.1 07/24/2015 1353   CALCIUM 10.0 06/27/2015 1040   ALKPHOS 94 07/24/2015 1353   ALKPHOS 87 01/02/2015 2217   AST 15 07/24/2015 1353   AST 16 01/02/2015 2217   ALT 13 07/24/2015 1353   ALT 19 01/02/2015 2217   BILITOT 0.37 07/24/2015 1353   BILITOT 1.5* 01/02/2015 2217     Lab Results  Component Value Date   WBC 7.1 09/04/2015   HGB 12.5 09/04/2015   HCT 38.1 09/04/2015   MCV 83.1 09/04/2015   PLT 325 09/04/2015   NEUTROABS 3.8 09/04/2015   ASSESSMENT & PLAN:  Cancer of central portion of right female breast Right breast retroareolar mass 3.7 x 2.8 x 1.7 cm involving anterior two thirds of the breast, T2 N0 M0 stage II a clinical stage, ER 0%, appears 0%, HER-2 positive ratio 3.53, PALB 2 mutation, completed neo-adjuvant chemotherapy with [place] started 12/04/2014 and completed 03/20/2015  Right Mastectomy 04/17/15: Microscopic focus of DCIS 0.1 cm 0/2 LN Neg. No invasive cancer. Path CR; Left Mastectomy: Neg  Recommendation: Continue Herceptin maintenance for 1 year   Arthritis in her knees and hips  WBC changes: We noticed that the neutrophil count has decreased to 1.6 previously and now her [place] is normal at 5.1  Severe anxiety and stress: I recommended guided imagery. But she hasn't yet met with the therapist.  Severe hot flashes since she got oophorectomy: I prescribed her Neurontin 100 mg at bedtime. But she has  not been taking this medication because she is worried about weight gain related to Neurontin. I discussed with her that the Neurontin would also help with neuropathy. She will plan to take this on a regular basis.  Echocardiogram 06/27/2015: EF 55-60%   We searched for different clinical trials that she may be eligible to participate in. Unfortunately the trials that she is interested in are not available locally. The remainder of the trials that she looked up especially vaccine trial for HER-2 positive patients is not available nearby and she also she is not eligible to participate in it at this time.  RTC every 3 weeks for Herceptin and q 6 weeks for clinic follow ups  No orders of the defined types were placed in this encounter.   The patient has a good understanding of the overall plan. she agrees with it. she will call with any problems that may develop before the next visit here.   Rulon Eisenmenger, MD 09/04/2015

## 2015-09-04 NOTE — Progress Notes (Signed)
Unable to get in to exam room prior to MD.  No assessment performed.  

## 2015-09-05 ENCOUNTER — Encounter: Payer: Self-pay | Admitting: *Deleted

## 2015-09-24 ENCOUNTER — Other Ambulatory Visit: Payer: Self-pay | Admitting: Hematology and Oncology

## 2015-09-24 DIAGNOSIS — C50111 Malignant neoplasm of central portion of right female breast: Secondary | ICD-10-CM

## 2015-09-25 ENCOUNTER — Ambulatory Visit (HOSPITAL_BASED_OUTPATIENT_CLINIC_OR_DEPARTMENT_OTHER): Payer: Managed Care, Other (non HMO)

## 2015-09-25 VITALS — BP 101/63 | HR 65 | Temp 99.0°F | Resp 16

## 2015-09-25 DIAGNOSIS — Z5112 Encounter for antineoplastic immunotherapy: Secondary | ICD-10-CM | POA: Diagnosis not present

## 2015-09-25 DIAGNOSIS — C50111 Malignant neoplasm of central portion of right female breast: Secondary | ICD-10-CM | POA: Diagnosis not present

## 2015-09-25 MED ORDER — ACETAMINOPHEN 325 MG PO TABS
ORAL_TABLET | ORAL | Status: AC
  Filled 2015-09-25: qty 2

## 2015-09-25 MED ORDER — SODIUM CHLORIDE 0.9 % IJ SOLN
10.0000 mL | INTRAMUSCULAR | Status: DC | PRN
  Administered 2015-09-25: 10 mL
  Filled 2015-09-25: qty 10

## 2015-09-25 MED ORDER — SODIUM CHLORIDE 0.9 % IV SOLN
Freq: Once | INTRAVENOUS | Status: AC
  Administered 2015-09-25: 10:00:00 via INTRAVENOUS

## 2015-09-25 MED ORDER — HEPARIN SOD (PORK) LOCK FLUSH 100 UNIT/ML IV SOLN
500.0000 [IU] | Freq: Once | INTRAVENOUS | Status: AC | PRN
  Administered 2015-09-25: 500 [IU]
  Filled 2015-09-25: qty 5

## 2015-09-25 MED ORDER — TRASTUZUMAB CHEMO INJECTION 440 MG
6.0000 mg/kg | Freq: Once | INTRAVENOUS | Status: AC
  Administered 2015-09-25: 315 mg via INTRAVENOUS
  Filled 2015-09-25: qty 15

## 2015-09-25 MED ORDER — ACETAMINOPHEN 325 MG PO TABS
650.0000 mg | ORAL_TABLET | Freq: Once | ORAL | Status: AC
  Administered 2015-09-25: 650 mg via ORAL

## 2015-09-25 NOTE — Progress Notes (Signed)
Pt refused benadryl. She had been using baby benadryl (12.5 mg) but did not use it today

## 2015-10-15 NOTE — Assessment & Plan Note (Signed)
Right breast retroareolar mass 3.7 x 2.8 x 1.7 cm involving anterior two thirds of the breast, T2 N0 M0 stage II a clinical stage, ER 0%, appears 0%, HER-2 positive ratio 3.53, PALB 2 mutation, completed neo-adjuvant chemotherapy with [place] started 12/04/2014 and completed 03/20/2015  Right Mastectomy 04/17/15: Microscopic focus of DCIS 0.1 cm 0/2 LN Neg. No invasive cancer. Path CR; Left Mastectomy: Neg  Recommendation: Continue Herceptin maintenance for 1 year   Arthritis in her knees and hips  WBC changes: We noticed that the neutrophil count has decreased to 1.6 previously and now her [place] is normal at 5.1  Severe anxiety and stress: I recommended guided imagery. But she hasn't yet met with the therapist.  Severe hot flashes since she got oophorectomy: I prescribed her Neurontin 100 mg at bedtime. But she has not been taking this medication because she is worried about weight gain related to Neurontin. I discussed with her that the Neurontin would also help with neuropathy. She will plan to take this on a regular basis.  Echocardiogram 06/27/2015: EF 55-60%   We searched for different clinical trials that she may be eligible to participate in. Unfortunately the trials that she is interested in are not available locally. The remainder of the trials that she looked up especially vaccine trial for HER-2 positive patients is not available nearby and she also she is not eligible to participate in it at this time.  RTC every 3 weeks for Herceptin and q 6 weeks for clinic follow ups

## 2015-10-16 ENCOUNTER — Telehealth: Payer: Self-pay | Admitting: Hematology and Oncology

## 2015-10-16 ENCOUNTER — Other Ambulatory Visit (HOSPITAL_BASED_OUTPATIENT_CLINIC_OR_DEPARTMENT_OTHER): Payer: Managed Care, Other (non HMO)

## 2015-10-16 ENCOUNTER — Encounter: Payer: Self-pay | Admitting: Hematology and Oncology

## 2015-10-16 ENCOUNTER — Ambulatory Visit (HOSPITAL_BASED_OUTPATIENT_CLINIC_OR_DEPARTMENT_OTHER): Payer: Managed Care, Other (non HMO) | Admitting: Hematology and Oncology

## 2015-10-16 ENCOUNTER — Ambulatory Visit (HOSPITAL_BASED_OUTPATIENT_CLINIC_OR_DEPARTMENT_OTHER): Payer: Managed Care, Other (non HMO)

## 2015-10-16 ENCOUNTER — Encounter: Payer: Self-pay | Admitting: *Deleted

## 2015-10-16 VITALS — BP 116/63 | HR 68 | Temp 98.2°F | Resp 17 | Wt 121.1 lb

## 2015-10-16 DIAGNOSIS — C50111 Malignant neoplasm of central portion of right female breast: Secondary | ICD-10-CM | POA: Diagnosis not present

## 2015-10-16 DIAGNOSIS — Z1589 Genetic susceptibility to other disease: Secondary | ICD-10-CM

## 2015-10-16 DIAGNOSIS — Z5112 Encounter for antineoplastic immunotherapy: Secondary | ICD-10-CM | POA: Diagnosis not present

## 2015-10-16 DIAGNOSIS — Z1509 Genetic susceptibility to other malignant neoplasm: Secondary | ICD-10-CM

## 2015-10-16 DIAGNOSIS — C50919 Malignant neoplasm of unspecified site of unspecified female breast: Secondary | ICD-10-CM

## 2015-10-16 DIAGNOSIS — Z1502 Genetic susceptibility to malignant neoplasm of ovary: Secondary | ICD-10-CM

## 2015-10-16 LAB — COMPREHENSIVE METABOLIC PANEL
ALT: 14 U/L (ref 0–55)
ANION GAP: 9 meq/L (ref 3–11)
AST: 17 U/L (ref 5–34)
Albumin: 3.9 g/dL (ref 3.5–5.0)
Alkaline Phosphatase: 63 U/L (ref 40–150)
BUN: 17 mg/dL (ref 7.0–26.0)
CHLORIDE: 108 meq/L (ref 98–109)
CO2: 26 meq/L (ref 22–29)
CREATININE: 1 mg/dL (ref 0.6–1.1)
Calcium: 9.9 mg/dL (ref 8.4–10.4)
EGFR: 72 mL/min/{1.73_m2} — ABNORMAL LOW (ref >90)
GLUCOSE: 87 mg/dL (ref 70–140)
Potassium: 4.1 mEq/L (ref 3.5–5.1)
SODIUM: 143 meq/L (ref 136–145)
TOTAL PROTEIN: 6.5 g/dL (ref 6.4–8.3)
Total Bilirubin: 0.68 mg/dL (ref 0.20–1.20)

## 2015-10-16 LAB — CBC WITH DIFFERENTIAL/PLATELET
BASO%: 1.2 % (ref 0.0–2.0)
Basophils Absolute: 0.1 10*3/uL (ref 0.0–0.1)
EOS%: 3.5 % (ref 0.0–7.0)
Eosinophils Absolute: 0.2 10*3/uL (ref 0.0–0.5)
HCT: 37.2 % (ref 34.8–46.6)
HGB: 12.2 g/dL (ref 11.6–15.9)
LYMPH%: 46.1 % (ref 14.0–49.7)
MCH: 27.7 pg (ref 25.1–34.0)
MCHC: 32.8 g/dL (ref 31.5–36.0)
MCV: 84.6 fL (ref 79.5–101.0)
MONO#: 0.3 10*3/uL (ref 0.1–0.9)
MONO%: 7 % (ref 0.0–14.0)
NEUT#: 2 10*3/uL (ref 1.5–6.5)
NEUT%: 42.2 % (ref 38.4–76.8)
Platelets: 223 10*3/uL (ref 145–400)
RBC: 4.4 10*6/uL (ref 3.70–5.45)
RDW: 15.7 % — ABNORMAL HIGH (ref 11.2–14.5)
WBC: 4.7 10*3/uL (ref 3.9–10.3)
lymph#: 2.1 10*3/uL (ref 0.9–3.3)

## 2015-10-16 MED ORDER — TRASTUZUMAB CHEMO INJECTION 440 MG
6.0000 mg/kg | Freq: Once | INTRAVENOUS | Status: AC
  Administered 2015-10-16: 315 mg via INTRAVENOUS
  Filled 2015-10-16: qty 15

## 2015-10-16 MED ORDER — ACETAMINOPHEN 325 MG PO TABS
ORAL_TABLET | ORAL | Status: AC
  Filled 2015-10-16: qty 2

## 2015-10-16 MED ORDER — HEPARIN SOD (PORK) LOCK FLUSH 100 UNIT/ML IV SOLN
500.0000 [IU] | Freq: Once | INTRAVENOUS | Status: AC | PRN
  Administered 2015-10-16: 500 [IU]
  Filled 2015-10-16: qty 5

## 2015-10-16 MED ORDER — DIPHENHYDRAMINE HCL 25 MG PO CAPS: 25.0000 mg | ORAL_CAPSULE | Freq: Once | ORAL | Status: DC

## 2015-10-16 MED ORDER — ACETAMINOPHEN 325 MG PO TABS
650.0000 mg | ORAL_TABLET | Freq: Once | ORAL | Status: AC
  Administered 2015-10-16: 650 mg via ORAL

## 2015-10-16 MED ORDER — SODIUM CHLORIDE 0.9 % IV SOLN
Freq: Once | INTRAVENOUS | Status: AC
  Administered 2015-10-16: 10:00:00 via INTRAVENOUS

## 2015-10-16 MED ORDER — SODIUM CHLORIDE 0.9 % IJ SOLN
10.0000 mL | INTRAMUSCULAR | Status: DC | PRN
Start: 2015-10-16 — End: 2016-01-04
  Administered 2015-10-16: 10 mL
  Filled 2015-10-16: qty 10

## 2015-10-16 NOTE — Telephone Encounter (Signed)
appt made and avs printed °

## 2015-10-16 NOTE — Progress Notes (Signed)
Per Dr. Lindi Adie - OK to treat without ECHO.

## 2015-10-16 NOTE — Progress Notes (Signed)
Unable to get in to exam room prior to MD.  No assessment performed.  

## 2015-10-16 NOTE — Patient Instructions (Signed)
[facility] Discharge Instructions for Patients Receiving Chemotherapy  Today you received the following chemotherapy agents: Herceptin   To help prevent nausea and vomiting after your treatment, we encourage you to take your nausea medication as directed.    If you develop nausea and vomiting that is not controlled by your nausea medication, call the clinic.   BELOW ARE SYMPTOMS THAT SHOULD BE REPORTED IMMEDIATELY:  *FEVER GREATER THAN 100.5 F  *CHILLS WITH OR WITHOUT FEVER  NAUSEA AND VOMITING THAT IS NOT CONTROLLED WITH YOUR NAUSEA MEDICATION  *UNUSUAL SHORTNESS OF BREATH  *UNUSUAL BRUISING OR BLEEDING  TENDERNESS IN MOUTH AND THROAT WITH OR WITHOUT PRESENCE OF ULCERS  *URINARY PROBLEMS  *BOWEL PROBLEMS  UNUSUAL RASH Items with * indicate a potential emergency and should be followed up as soon as possible.  Feel free to call the clinic you have any questions or concerns. The clinic phone number is [phone].  Please show the CHEMO ALERT CARD at check-in to the Emergency Department and triage nurse.   

## 2015-10-16 NOTE — Progress Notes (Unsigned)
Okay to treat today despite last echo on 06/27/15 per Terri, Therapist, sports.  Dr. Lindi Adie discussed with pt and pt scheduled for next week.

## 2015-10-16 NOTE — Progress Notes (Signed)
Patient Care Team: No Pcp Per Patient as PCP - General (General Practice)  SUMMARY OF ONCOLOGIC HISTORY:   Cancer of central portion of right female breast ([place])   11/14/2014 Initial Diagnosis Right breast biopsy 8:30 position: Invasive ductal carcinoma with DCIS, grade 2, ER 0%, PR 0%, HER-2 positive ratio 3.53   11/20/2014 Breast MRI Right breast: 3.7 x 2.8 x 1.7 cm area of non-mass enhancement and retroareolar right breast into the lower outer quadrant and lower inner quadrant extending into the nipple involving anterior two thirds of the breast, no lymph nodes   12/04/2014 -  Neo-Adjuvant Chemotherapy Taxotere, carboplatin, Herceptin, Perjeta 6 cycles followed by Herceptin maintenance for 1 year   12/19/2014 Procedure PALB2 Mutation   04/17/2015 Surgery Right Mastectomy: Microscopic focus of DCIS 0.1 cm 0/2 LN Neg. No invasive cancer. Path CR;  Left Mastectomy: Neg   07/04/2015 Surgery Bilateral salpingo-oophorectomy    CHIEF COMPLIANT: Herceptin maintenance therapy complaining of pain in the ribs  INTERVAL HISTORY: LYNASIA MELOCHE is a 40 year old with above-mentioned history of HER-2 positive breast cancer who is currently on Herceptin maintenance and appears to be tolerating it extremely well. She is complaining of rib discomfort. She denies any signs or symptoms of heart failure.  REVIEW OF SYSTEMS:   Constitutional: Denies fevers, chills or abnormal weight loss Eyes: Denies blurriness of vision Ears, nose, mouth, throat, and face: Denies mucositis or sore throat Respiratory: Denies cough, dyspnea or wheezes Cardiovascular: Denies palpitation, chest discomfort Gastrointestinal:  Denies nausea, heartburn or change in bowel habits Skin: Denies abnormal skin rashes Lymphatics: Denies new lymphadenopathy or easy bruising Neurological:Denies numbness, tingling or new weaknesses Behavioral/Psych: Mood is stable, no new changes  Extremities: No lower extremity edema Breast:  denies any pain  or lumps or nodules in either breasts All other systems were reviewed with the patient and are negative.  I have reviewed the past medical history, past surgical history, social history and family history with the patient and they are unchanged from previous note.  ALLERGIES:  is allergic to compazine.  MEDICATIONS:  Current Outpatient Prescriptions  Medication Sig Dispense Refill  . gabapentin (NEURONTIN) 100 MG capsule Take 1 capsule (100 mg total) by mouth at bedtime. 30 capsule 3  . Probiotic Product (PROBIOTIC PO) Take 1 capsule by mouth daily.     No current facility-administered medications for this visit.    PHYSICAL EXAMINATION: ECOG PERFORMANCE STATUS: 0 - Asymptomatic  Filed Vitals:   10/16/15 0913  BP: 116/63  Pulse: 68  Temp: 98.2 F (36.8 C)  Resp: 17   Filed Weights   10/16/15 0913  Weight: 121 lb 1.6 oz (54.931 kg)    GENERAL:alert, no distress and comfortable SKIN: skin color, texture, turgor are normal, no rashes or significant lesions EYES: normal, Conjunctiva are pink and non-injected, sclera clear OROPHARYNX:no exudate, no erythema and lips, buccal mucosa, and tongue normal  NECK: supple, thyroid normal size, non-tender, without nodularity LYMPH:  no palpable lymphadenopathy in the cervical, axillary or inguinal LUNGS: clear to auscultation and percussion with normal breathing effort HEART: regular rate & rhythm and no murmurs and no lower extremity edema ABDOMEN:abdomen soft, non-tender and normal bowel sounds MUSCULOSKELETAL:no cyanosis of digits and no clubbing  NEURO: alert & oriented x 3 with fluent speech, no focal motor/sensory deficits EXTREMITIES: No lower extremity edema BREAST: No palpable masses or nodules in either right or left reconstructed breasts. No palpable abnormalities along the ribs. No reproducible pain. No palpable axillary supraclavicular or  infraclavicular adenopathy no breast tenderness or nipple discharge. (exam performed in  the presence of a chaperone)  LABORATORY DATA:  I have reviewed the data as listed   Chemistry      Component Value Date/Time   NA 143 10/16/2015 0902   NA 141 06/27/2015 1040   K 4.1 10/16/2015 0902   K 4.4 06/27/2015 1040   CL 105 06/27/2015 1040   CO2 26 10/16/2015 0902   CO2 27 06/27/2015 1040   BUN 17.0 10/16/2015 0902   BUN 15 06/27/2015 1040   CREATININE 1.0 10/16/2015 0902   CREATININE 0.96 06/27/2015 1040      Component Value Date/Time   CALCIUM 9.9 10/16/2015 0902   CALCIUM 10.0 06/27/2015 1040   ALKPHOS 63 10/16/2015 0902   ALKPHOS 87 01/02/2015 2217   AST 17 10/16/2015 0902   AST 16 01/02/2015 2217   ALT 14 10/16/2015 0902   ALT 19 01/02/2015 2217   BILITOT 0.68 10/16/2015 0902   BILITOT 1.5* 01/02/2015 2217       Lab Results  Component Value Date   WBC 4.7 10/16/2015   HGB 12.2 10/16/2015   HCT 37.2 10/16/2015   MCV 84.6 10/16/2015   PLT 223 10/16/2015   NEUTROABS 2.0 10/16/2015     ASSESSMENT & PLAN:  Cancer of central portion of right female breast Right breast retroareolar mass 3.7 x 2.8 x 1.7 cm involving anterior two thirds of the breast, T2 N0 M0 stage II a clinical stage, ER 0%, appears 0%, HER-2 positive ratio 3.53, PALB 2 mutation, completed neo-adjuvant chemotherapy with [place] started 12/04/2014 and completed 03/20/2015  Right Mastectomy 04/17/15: Microscopic focus of DCIS 0.1 cm 0/2 LN Neg. No invasive cancer. Path CR; Left Mastectomy: Neg  Recommendation: Continue Herceptin maintenance for 1 year   Arthritis in her knees and hips  WBC changes: We noticed that the neutrophil count has decreased to 1.6 previously and now her [place] is normal at 5.1  Severe anxiety and stress: I recommended guided imagery. But she hasn't yet met with the therapist.  Severe hot flashes since she got oophorectomy: I prescribed her Neurontin 100 mg at bedtime. But she has not been taking this medication because she is worried about weight gain related to  Neurontin. I discussed with her that the Neurontin would also help with neuropathy. She will plan to take this on a regular basis.  Echocardiogram 06/27/2015: EF 55-60%   We searched for different clinical trials that she may be eligible to participate in. Unfortunately the trials that she is interested in are not available locally. The remainder of the trials that she looked up especially vaccine trial for HER-2 positive patients is not available nearby and she also she is not eligible to participate in it at this time.  RTC every 3 weeks for Herceptin and q 6 weeks for clinic follow ups last Herceptin treatment will be on May 10th 2017. We briefly discussed the pros and cons of a new and upcoming therapy Neratinib. Patient may consider taking this medication after she completes one year of Herceptin therapy once the drug gets approved.  No orders of the defined types were placed in this encounter.   The patient has a good understanding of the overall plan. she agrees with it. she will call with any problems that may develop before the next visit here.   Rulon Eisenmenger, MD 10/16/2015

## 2015-10-21 ENCOUNTER — Ambulatory Visit (HOSPITAL_COMMUNITY)
Admission: RE | Admit: 2015-10-21 | Discharge: 2015-10-21 | Disposition: A | Discharge status: Home / Self Care | Payer: Managed Care, Other (non HMO) | Source: Ambulatory Visit | Attending: Hematology and Oncology | Admitting: Hematology and Oncology

## 2015-10-21 DIAGNOSIS — I351 Nonrheumatic aortic (valve) insufficiency: Secondary | ICD-10-CM | POA: Diagnosis not present

## 2015-10-21 DIAGNOSIS — C50111 Malignant neoplasm of central portion of right female breast: Secondary | ICD-10-CM | POA: Diagnosis not present

## 2015-10-21 DIAGNOSIS — Z09 Encounter for follow-up examination after completed treatment for conditions other than malignant neoplasm: Secondary | ICD-10-CM | POA: Diagnosis present

## 2015-10-21 NOTE — Progress Notes (Signed)
Echocardiogram 2D Echocardiogram has been performed.  Joelene Millin 10/21/2015, 11:24 AM

## 2015-10-31 ENCOUNTER — Other Ambulatory Visit: Payer: Self-pay | Admitting: Hematology and Oncology

## 2015-10-31 DIAGNOSIS — C50111 Malignant neoplasm of central portion of right female breast: Secondary | ICD-10-CM

## 2015-10-31 MED ORDER — LORAZEPAM 0.5 MG PO TABS: ORAL_TABLET | ORAL | Status: DC

## 2015-11-06 ENCOUNTER — Ambulatory Visit (HOSPITAL_BASED_OUTPATIENT_CLINIC_OR_DEPARTMENT_OTHER): Payer: Managed Care, Other (non HMO)

## 2015-11-06 VITALS — BP 105/62 | HR 58 | Temp 98.1°F | Resp 17

## 2015-11-06 DIAGNOSIS — C50111 Malignant neoplasm of central portion of right female breast: Secondary | ICD-10-CM | POA: Diagnosis not present

## 2015-11-06 DIAGNOSIS — Z5112 Encounter for antineoplastic immunotherapy: Secondary | ICD-10-CM

## 2015-11-06 MED ORDER — TRASTUZUMAB CHEMO INJECTION 440 MG
6.0000 mg/kg | Freq: Once | INTRAVENOUS | Status: AC
  Administered 2015-11-06: 315 mg via INTRAVENOUS
  Filled 2015-11-06: qty 15

## 2015-11-06 MED ORDER — HEPARIN SOD (PORK) LOCK FLUSH 100 UNIT/ML IV SOLN
500.0000 [IU] | Freq: Once | INTRAVENOUS | Status: AC | PRN
  Administered 2015-11-06: 500 [IU]
  Filled 2015-11-06: qty 5

## 2015-11-06 MED ORDER — SODIUM CHLORIDE 0.9 % IJ SOLN
10.0000 mL | INTRAMUSCULAR | Status: DC | PRN
  Administered 2015-11-06: 10 mL
  Filled 2015-11-06: qty 10

## 2015-11-06 MED ORDER — SODIUM CHLORIDE 0.9 % IV SOLN
Freq: Once | INTRAVENOUS | Status: AC
  Administered 2015-11-06: 10:00:00 via INTRAVENOUS

## 2015-11-06 MED ORDER — ACETAMINOPHEN 325 MG PO TABS
650.0000 mg | ORAL_TABLET | Freq: Once | ORAL | Status: AC
  Administered 2015-11-06: 650 mg via ORAL

## 2015-11-06 MED ORDER — ACETAMINOPHEN 325 MG PO TABS
ORAL_TABLET | ORAL | Status: AC
  Filled 2015-11-06: qty 2

## 2015-11-06 NOTE — Progress Notes (Signed)
Patient declined the Benadryl today - stated she does not take it prior to Herceptin any longer.

## 2015-11-06 NOTE — Patient Instructions (Signed)
[facility] Discharge Instructions for Patients Receiving Chemotherapy  Today you received the following chemotherapy agents: Herceptin   To help prevent nausea and vomiting after your treatment, we encourage you to take your nausea medication as directed.    If you develop nausea and vomiting that is not controlled by your nausea medication, call the clinic.   BELOW ARE SYMPTOMS THAT SHOULD BE REPORTED IMMEDIATELY:  *FEVER GREATER THAN 100.5 F  *CHILLS WITH OR WITHOUT FEVER  NAUSEA AND VOMITING THAT IS NOT CONTROLLED WITH YOUR NAUSEA MEDICATION  *UNUSUAL SHORTNESS OF BREATH  *UNUSUAL BRUISING OR BLEEDING  TENDERNESS IN MOUTH AND THROAT WITH OR WITHOUT PRESENCE OF ULCERS  *URINARY PROBLEMS  *BOWEL PROBLEMS  UNUSUAL RASH Items with * indicate a potential emergency and should be followed up as soon as possible.  Feel free to call the clinic you have any questions or concerns. The clinic phone number is [phone].  Please show the CHEMO ALERT CARD at check-in to the Emergency Department and triage nurse.   

## 2015-11-22 ENCOUNTER — Encounter: Payer: Self-pay | Admitting: Hematology and Oncology

## 2015-11-22 NOTE — Progress Notes (Signed)
Fax sent 06/17/15 I sent to medical records

## 2015-11-27 ENCOUNTER — Other Ambulatory Visit (HOSPITAL_BASED_OUTPATIENT_CLINIC_OR_DEPARTMENT_OTHER): Payer: Managed Care, Other (non HMO)

## 2015-11-27 ENCOUNTER — Encounter: Payer: Self-pay | Admitting: Hematology and Oncology

## 2015-11-27 ENCOUNTER — Ambulatory Visit (HOSPITAL_BASED_OUTPATIENT_CLINIC_OR_DEPARTMENT_OTHER): Payer: Managed Care, Other (non HMO)

## 2015-11-27 ENCOUNTER — Telehealth: Payer: Self-pay | Admitting: Hematology and Oncology

## 2015-11-27 ENCOUNTER — Ambulatory Visit (HOSPITAL_BASED_OUTPATIENT_CLINIC_OR_DEPARTMENT_OTHER): Payer: Managed Care, Other (non HMO) | Admitting: Hematology and Oncology

## 2015-11-27 VITALS — BP 104/77 | HR 70 | Temp 98.2°F | Resp 18 | Ht 62.5 in | Wt 120.9 lb

## 2015-11-27 DIAGNOSIS — Z1502 Genetic susceptibility to malignant neoplasm of ovary: Secondary | ICD-10-CM

## 2015-11-27 DIAGNOSIS — C50919 Malignant neoplasm of unspecified site of unspecified female breast: Secondary | ICD-10-CM

## 2015-11-27 DIAGNOSIS — C50111 Malignant neoplasm of central portion of right female breast: Secondary | ICD-10-CM

## 2015-11-27 DIAGNOSIS — Z5112 Encounter for antineoplastic immunotherapy: Secondary | ICD-10-CM

## 2015-11-27 DIAGNOSIS — C50411 Malignant neoplasm of upper-outer quadrant of right female breast: Secondary | ICD-10-CM

## 2015-11-27 DIAGNOSIS — Z1509 Genetic susceptibility to other malignant neoplasm: Secondary | ICD-10-CM

## 2015-11-27 DIAGNOSIS — Z1589 Genetic susceptibility to other disease: Secondary | ICD-10-CM

## 2015-11-27 LAB — CBC WITH DIFFERENTIAL/PLATELET
BASO%: 0.4 % (ref 0.0–2.0)
Basophils Absolute: 0 10*3/uL (ref 0.0–0.1)
EOS%: 1 % (ref 0.0–7.0)
Eosinophils Absolute: 0.1 10*3/uL (ref 0.0–0.5)
HEMATOCRIT: 39.3 % (ref 34.8–46.6)
HEMOGLOBIN: 13.3 g/dL (ref 11.6–15.9)
LYMPH#: 2.9 10*3/uL (ref 0.9–3.3)
LYMPH%: 37.5 % (ref 14.0–49.7)
MCH: 28.9 pg (ref 25.1–34.0)
MCHC: 33.8 g/dL (ref 31.5–36.0)
MCV: 85.2 fL (ref 79.5–101.0)
MONO#: 0.4 10*3/uL (ref 0.1–0.9)
MONO%: 5.1 % (ref 0.0–14.0)
NEUT#: 4.4 10*3/uL (ref 1.5–6.5)
NEUT%: 56 % (ref 38.4–76.8)
Platelets: 229 10*3/uL (ref 145–400)
RBC: 4.61 10*6/uL (ref 3.70–5.45)
RDW: 13.3 % (ref 11.2–14.5)
WBC: 7.8 10*3/uL (ref 3.9–10.3)

## 2015-11-27 LAB — COMPREHENSIVE METABOLIC PANEL
ALBUMIN: 4.3 g/dL (ref 3.5–5.0)
ALK PHOS: 74 U/L (ref 40–150)
ALT: 13 U/L (ref 0–55)
AST: 14 U/L (ref 5–34)
Anion Gap: 9 mEq/L (ref 3–11)
BILIRUBIN TOTAL: 0.78 mg/dL (ref 0.20–1.20)
BUN: 21.9 mg/dL (ref 7.0–26.0)
CALCIUM: 9.8 mg/dL (ref 8.4–10.4)
CHLORIDE: 105 meq/L (ref 98–109)
CO2: 27 mEq/L (ref 22–29)
CREATININE: 1.2 mg/dL — AB (ref 0.6–1.1)
EGFR: 58 mL/min/{1.73_m2} — ABNORMAL LOW (ref >90)
Glucose: 107 mg/dl (ref 70–140)
Potassium: 3.9 mEq/L (ref 3.5–5.1)
Sodium: 141 mEq/L (ref 136–145)
TOTAL PROTEIN: 7 g/dL (ref 6.4–8.3)

## 2015-11-27 MED ORDER — ACETAMINOPHEN 325 MG PO TABS
ORAL_TABLET | ORAL | Status: AC
  Filled 2015-11-27: qty 2

## 2015-11-27 MED ORDER — ACETAMINOPHEN 325 MG PO TABS
650.0000 mg | ORAL_TABLET | Freq: Once | ORAL | Status: AC
  Administered 2015-11-27: 650 mg via ORAL

## 2015-11-27 MED ORDER — SODIUM CHLORIDE 0.9 % IV SOLN
Freq: Once | INTRAVENOUS | Status: AC
  Administered 2015-11-27: 16:00:00 via INTRAVENOUS

## 2015-11-27 MED ORDER — SODIUM CHLORIDE 0.9 % IJ SOLN
10.0000 mL | INTRAMUSCULAR | Status: DC | PRN
  Administered 2015-11-27: 10 mL
  Filled 2015-11-27: qty 10

## 2015-11-27 MED ORDER — TRASTUZUMAB CHEMO INJECTION 440 MG
6.0000 mg/kg | Freq: Once | INTRAVENOUS | Status: AC
  Administered 2015-11-27: 315 mg via INTRAVENOUS
  Filled 2015-11-27: qty 15

## 2015-11-27 MED ORDER — HEPARIN SOD (PORK) LOCK FLUSH 100 UNIT/ML IV SOLN
500.0000 [IU] | Freq: Once | INTRAVENOUS | Status: AC | PRN
  Administered 2015-11-27: 500 [IU]
  Filled 2015-11-27: qty 5

## 2015-11-27 NOTE — Telephone Encounter (Signed)
appt made and avs printed °

## 2015-11-27 NOTE — Patient Instructions (Signed)
[facility] Discharge Instructions for Patients Receiving Chemotherapy  Today you received the following chemotherapy agents: Herceptin   To help prevent nausea and vomiting after your treatment, we encourage you to take your nausea medication as directed.    If you develop nausea and vomiting that is not controlled by your nausea medication, call the clinic.   BELOW ARE SYMPTOMS THAT SHOULD BE REPORTED IMMEDIATELY:  *FEVER GREATER THAN 100.5 F  *CHILLS WITH OR WITHOUT FEVER  NAUSEA AND VOMITING THAT IS NOT CONTROLLED WITH YOUR NAUSEA MEDICATION  *UNUSUAL SHORTNESS OF BREATH  *UNUSUAL BRUISING OR BLEEDING  TENDERNESS IN MOUTH AND THROAT WITH OR WITHOUT PRESENCE OF ULCERS  *URINARY PROBLEMS  *BOWEL PROBLEMS  UNUSUAL RASH Items with * indicate a potential emergency and should be followed up as soon as possible.  Feel free to call the clinic you have any questions or concerns. The clinic phone number is [phone].  Please show the CHEMO ALERT CARD at check-in to the Emergency Department and triage nurse.   

## 2015-11-27 NOTE — Assessment & Plan Note (Signed)
Right breast retroareolar mass 3.7 x 2.8 x 1.7 cm involving anterior two thirds of the breast, T2 N0 M0 stage II a clinical stage, ER 0%, appears 0%, HER-2 positive ratio 3.53, PALB 2 mutation, completed neo-adjuvant chemotherapy with [place] started 12/04/2014 and completed 03/20/2015  Right Mastectomy 04/17/15: Microscopic focus of DCIS 0.1 cm 0/2 LN Neg. No invasive cancer. Path CR; Left Mastectomy: Neg  Recommendation: Today is the last dose of Herceptin maintenance  Arthritis in her knees and hips   Severe hot flashes since she got oophorectomy:She could not tolerate neurontin. Echocardiogram April 2017: EF 55-60%   RTC in 3 months

## 2015-11-27 NOTE — Progress Notes (Signed)
Patient Care Team: No Pcp Per Patient as PCP - General (General Practice)   SUMMARY OF ONCOLOGIC HISTORY:   Cancer of central portion of right female breast ([place])   11/14/2014 Initial Diagnosis Right breast biopsy 8:30 position: Invasive ductal carcinoma with DCIS, grade 2, ER 0%, PR 0%, HER-2 positive ratio 3.53   11/20/2014 Breast MRI Right breast: 3.7 x 2.8 x 1.7 cm area of non-mass enhancement and retroareolar right breast into the lower outer quadrant and lower inner quadrant extending into the nipple involving anterior two thirds of the breast, no lymph nodes   12/04/2014 -  Neo-Adjuvant Chemotherapy Taxotere, carboplatin, Herceptin, Perjeta 6 cycles followed by Herceptin maintenance for 1 year   12/19/2014 Procedure PALB2 Mutation   04/17/2015 Surgery Right Mastectomy: Microscopic focus of DCIS 0.1 cm 0/2 LN Neg. No invasive cancer. Path CR;  Left Mastectomy: Neg   07/04/2015 Surgery Bilateral salpingo-oophorectomy    CHIEF COMPLIANT: Hot flashes  INTERVAL HISTORY: Robin Miles is a 40 yr old with H/O Rt Breast cancer who is here for her last dose of herceptin. Shes having hot flashes and couldn't tolerate neurontin. She denies any pain or discomfort. Shes interested in neratinib when it gets approved.  REVIEW OF SYSTEMS:   Constitutional: Denies fevers, chills or abnormal weight loss Eyes: Denies blurriness of vision Ears, nose, mouth, throat, and face: Denies mucositis or sore throat Respiratory: Denies cough, dyspnea or wheezes Cardiovascular: Denies palpitation, chest discomfort Gastrointestinal:  Denies nausea, heartburn or change in bowel habits Skin: Denies abnormal skin rashes Lymphatics: Denies new lymphadenopathy or easy bruising Neurological:Denies numbness, tingling or new weaknesses Behavioral/Psych: Mood is stable, no new changes  Extremities: No lower extremity edema Breast: denies any pain or lumps or nodules in either breasts All other systems were reviewed with  the patient and are negative.  I have reviewed the past medical history, past surgical history, social history and family history with the patient and they are unchanged from previous note.  ALLERGIES:  is allergic to compazine.  MEDICATIONS:  Current Outpatient Prescriptions  Medication Sig Dispense Refill  . gabapentin (NEURONTIN) 100 MG capsule Take 1 capsule (100 mg total) by mouth at bedtime. 30 capsule 3  . LORazepam (ATIVAN) 0.5 MG tablet TAKE 1 TABLET EVERY 6 HOURS AS NEEDED FOR ANXIETY 30 tablet 0  . Probiotic Product (PROBIOTIC PO) Take 1 capsule by mouth daily.     No current facility-administered medications for this visit.   Facility-Administered Medications Ordered in Other Visits  Medication Dose Route Frequency Provider Last Rate Last Dose  . diphenhydrAMINE (BENADRYL) capsule 25 mg  25 mg Oral Once Nicholas Lose, MD   25 mg at 10/16/15 1034  . sodium chloride 0.9 % injection 10 mL  10 mL Intracatheter PRN Nicholas Lose, MD   10 mL at 10/16/15 1140    PHYSICAL EXAMINATION: ECOG PERFORMANCE STATUS: 0  Filed Vitals:   11/27/15 1357  BP: 104/77  Pulse: 70  Temp: 98.2 F (36.8 C)  Resp: 18   Filed Weights   11/27/15 1357  Weight: 120 lb 14.4 oz (54.84 kg)    GENERAL:alert, no distress and comfortable SKIN: skin color, texture, turgor are normal, no rashes or significant lesions EYES: normal, Conjunctiva are pink and non-injected, sclera clear OROPHARYNX:no exudate, no erythema and lips, buccal mucosa, and tongue normal  NECK: supple, thyroid normal size, non-tender, without nodularity LYMPH:  no palpable lymphadenopathy in the cervical, axillary or inguinal LUNGS: clear to auscultation and percussion with  normal breathing effort HEART: regular rate & rhythm and no murmurs and no lower extremity edema ABDOMEN:abdomen soft, non-tender and normal bowel sounds MUSCULOSKELETAL:no cyanosis of digits and no clubbing  NEURO: alert & oriented x 3 with fluent speech, no  focal motor/sensory deficits EXTREMITIES: No lower extremity edema  LABORATORY DATA:  I have reviewed the data as listed   Chemistry      Component Value Date/Time   NA 141 11/27/2015 1345   NA 141 06/27/2015 1040   K 3.9 11/27/2015 1345   K 4.4 06/27/2015 1040   CL 105 06/27/2015 1040   CO2 27 11/27/2015 1345   CO2 27 06/27/2015 1040   BUN 21.9 11/27/2015 1345   BUN 15 06/27/2015 1040   CREATININE 1.2* 11/27/2015 1345   CREATININE 0.96 06/27/2015 1040      Component Value Date/Time   CALCIUM 9.8 11/27/2015 1345   CALCIUM 10.0 06/27/2015 1040   ALKPHOS 74 11/27/2015 1345   ALKPHOS 87 01/02/2015 2217   AST 14 11/27/2015 1345   AST 16 01/02/2015 2217   ALT 13 11/27/2015 1345   ALT 19 01/02/2015 2217   BILITOT 0.78 11/27/2015 1345   BILITOT 1.5* 01/02/2015 2217       Lab Results  Component Value Date   WBC 7.8 11/27/2015   HGB 13.3 11/27/2015   HCT 39.3 11/27/2015   MCV 85.2 11/27/2015   PLT 229 11/27/2015   NEUTROABS 4.4 11/27/2015     ASSESSMENT & PLAN:  Cancer of central portion of right female breast Right breast retroareolar mass 3.7 x 2.8 x 1.7 cm involving anterior two thirds of the breast, T2 N0 M0 stage II a clinical stage, ER 0%, appears 0%, HER-2 positive ratio 3.53, PALB 2 mutation, completed neo-adjuvant chemotherapy with TCH Perjeta started 12/04/2014 and completed 03/20/2015  Right Mastectomy 04/17/15: Microscopic focus of DCIS 0.1 cm 0/2 LN Neg. No invasive cancer. Path CR; Left Mastectomy: Neg  Recommendation: Today is the last dose of Herceptin maintenance  Arthritis in her knees and hips   Severe hot flashes since she got oophorectomy:She could not tolerate neurontin. Echocardiogram April 2017: EF 55-60%   RTC in 3 months    Orders Placed This Encounter  Procedures  . [place] Extrem Up Right Ltd    EPIC ORDER PF: 11/08/2014 BCG NO NEEDS  CR/VONTA [employer]  HX RT MASTECTOMY     Standing Status: Future     Number of Occurrences:       Standing Expiration Date: 01/26/2017    Order Specific Question:  Reason for Exam (SYMPTOM  OR DIAGNOSIS REQUIRED)    Answer:  Rt Axillary nodule with H/O breast cancer    Order Specific Question:  Preferred imaging location?    Answer:  [facility]   The patient has a good understanding of the overall plan. she agrees with it. she will call with any problems that may develop before the next visit here.   Rulon Eisenmenger, MD 11/27/2015

## 2015-11-28 ENCOUNTER — Other Ambulatory Visit: Payer: Self-pay | Admitting: Hematology and Oncology

## 2015-11-29 ENCOUNTER — Telehealth: Payer: Self-pay

## 2015-11-29 ENCOUNTER — Telehealth: Payer: Self-pay | Admitting: Hematology and Oncology

## 2015-11-29 ENCOUNTER — Other Ambulatory Visit: Payer: Self-pay

## 2015-11-29 NOTE — Telephone Encounter (Signed)
lvm for pt regarding to july appt...Marland KitchenMarland Kitchen

## 2015-11-29 NOTE — Telephone Encounter (Signed)
Received message from [place], [place] that pt was requesting PAC flushes to be set up.  POF entered for these to be scheduled every 8 weeks.  Called and left message with pt that these have been entered and that she should be contacted when scheduled.

## 2015-12-03 ENCOUNTER — Telehealth: Payer: Self-pay | Admitting: *Deleted

## 2015-12-03 DIAGNOSIS — C50111 Malignant neoplasm of central portion of right female breast: Secondary | ICD-10-CM

## 2015-12-03 NOTE — Telephone Encounter (Signed)
Spoke with patient to follow up after last herceptin.  She states she is doing well.  Discussed survivorship program with her and I will make that referral for her care plan.  Encouraged her to call with any needs or concerns.

## 2015-12-06 ENCOUNTER — Ambulatory Visit
Admission: RE | Admit: 2015-12-06 | Discharge: 2015-12-06 | Disposition: A | Discharge status: Home / Self Care | Payer: Managed Care, Other (non HMO) | Source: Ambulatory Visit | Attending: Hematology and Oncology | Admitting: Hematology and Oncology

## 2015-12-06 DIAGNOSIS — C50111 Malignant neoplasm of central portion of right female breast: Secondary | ICD-10-CM

## 2015-12-09 ENCOUNTER — Telehealth: Payer: Self-pay | Admitting: *Deleted

## 2015-12-09 NOTE — Telephone Encounter (Signed)
-----   Message from Nicholas Lose, MD sent at 11/28/2015  2:17 PM EDT ----- Regarding: RE: [facility] Flush Stanton Kidney I believe its in your scope of practice to place that POF. Please proceed with making the arrangement for port flush Thanks ----- Message -----    From: Lucile Crater, RN    Sent: 11/27/2015   3:28 PM      To: Nicholas Lose, MD, Cheree Ditto, RN Subject: [facility] Flush                                      Dr. Lindi Adie Desk RN Pt here with request "to keep [facility] for a little while" Pt would like to have her PAC flushed every 10 weeks. Please enter POF as she is concerned the appt will be forgotten. Thanks, Stanton Kidney, RN

## 2015-12-09 NOTE — Telephone Encounter (Signed)
5/12 desk RN entered POF to scheduling for [facility] Flush appts

## 2016-01-04 ENCOUNTER — Other Ambulatory Visit: Payer: Self-pay | Admitting: Nurse Practitioner

## 2016-01-20 ENCOUNTER — Other Ambulatory Visit: Payer: Self-pay | Admitting: Nurse Practitioner

## 2016-01-22 ENCOUNTER — Ambulatory Visit (HOSPITAL_BASED_OUTPATIENT_CLINIC_OR_DEPARTMENT_OTHER): Payer: Managed Care, Other (non HMO)

## 2016-01-22 VITALS — BP 111/71 | HR 65 | Temp 98.0°F | Resp 16

## 2016-01-22 DIAGNOSIS — C50111 Malignant neoplasm of central portion of right female breast: Secondary | ICD-10-CM

## 2016-01-22 DIAGNOSIS — Z452 Encounter for adjustment and management of vascular access device: Secondary | ICD-10-CM | POA: Diagnosis not present

## 2016-01-22 DIAGNOSIS — Z95828 Presence of other vascular implants and grafts: Secondary | ICD-10-CM

## 2016-01-22 MED ORDER — HEPARIN SOD (PORK) LOCK FLUSH 100 UNIT/ML IV SOLN
500.0000 [IU] | Freq: Once | INTRAVENOUS | Status: AC
  Administered 2016-01-22: 500 [IU] via INTRAVENOUS
  Filled 2016-01-22: qty 5

## 2016-01-22 MED ORDER — SODIUM CHLORIDE 0.9% FLUSH
10.0000 mL | INTRAVENOUS | Status: DC | PRN
  Administered 2016-01-22: 10 mL via INTRAVENOUS
  Filled 2016-01-22: qty 10

## 2016-01-22 NOTE — Patient Instructions (Signed)

## 2016-01-23 ENCOUNTER — Ambulatory Visit (HOSPITAL_COMMUNITY): Payer: Managed Care, Other (non HMO)

## 2016-01-23 ENCOUNTER — Ambulatory Visit (HOSPITAL_COMMUNITY): Payer: Managed Care, Other (non HMO) | Admitting: Internal Medicine

## 2016-01-24 IMAGING — MR MR BREAST BILATERAL W WO CONTRAST
6 of 13 series · 23 of 48 positions shown · IV contrast (9cc multihance)
Comparison: Previous exam(s).

CLINICAL DATA: 38-year-old female with history of right breast
invasive ductal carcinoma and ductal carcinoma in situ following
stereotactic guided biopsy of right breast calcifications
11/14/2014. Patient's MRI 11/20/2014 demonstrated non mass
enhancement involving the retroareolar right breast and extending
into lower outer quadrant and somewhat into the lower inner quadrant
and nipple spanning a distance of 3.7 x 2.8 x 1.7 cm. Assess
response to treatment post neoadjuvant chemotherapy.

LABS:  Most recent GFR on 03/20/2015:  89 mL/min.
EXAM:
BILATERAL BREAST MRI WITH AND WITHOUT CONTRAST
TECHNIQUE: Multiplanar, multisequence MR images of both breasts were obtained
prior to and following the intravenous administration of 9 ml of
MultiHance.

[Series 2: T2 · axial · 3.0mm · 0.47mm/px · z∈[-79,+83]mm · 3 of 55 slices shown]
[im 1/55]
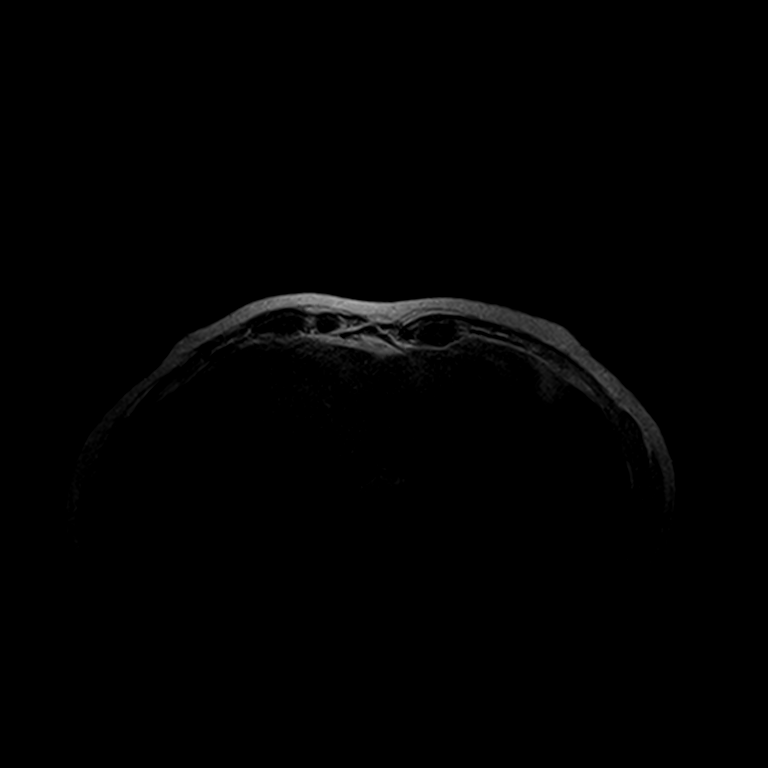
[im 28/55]
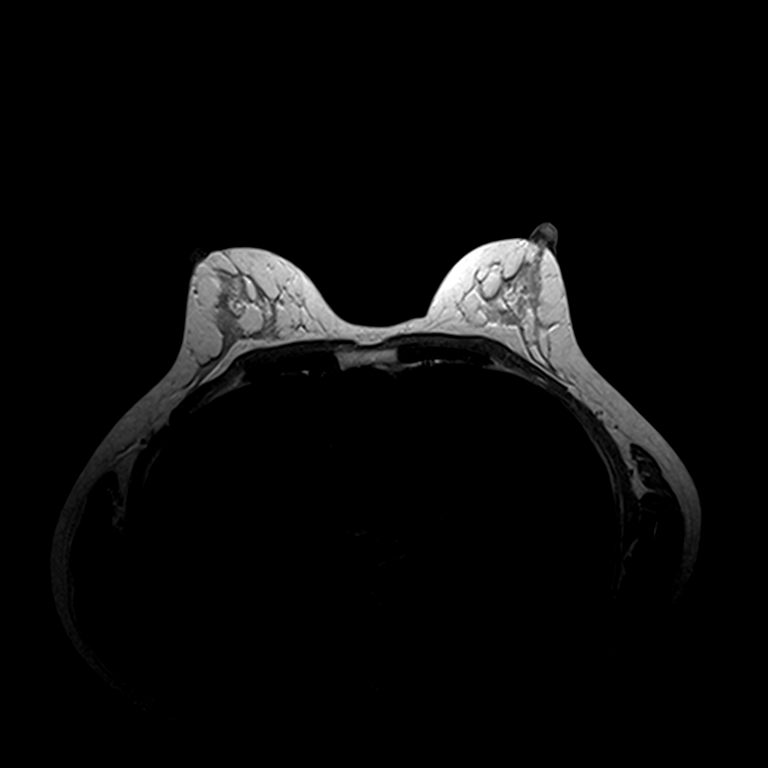
[im 55/55]
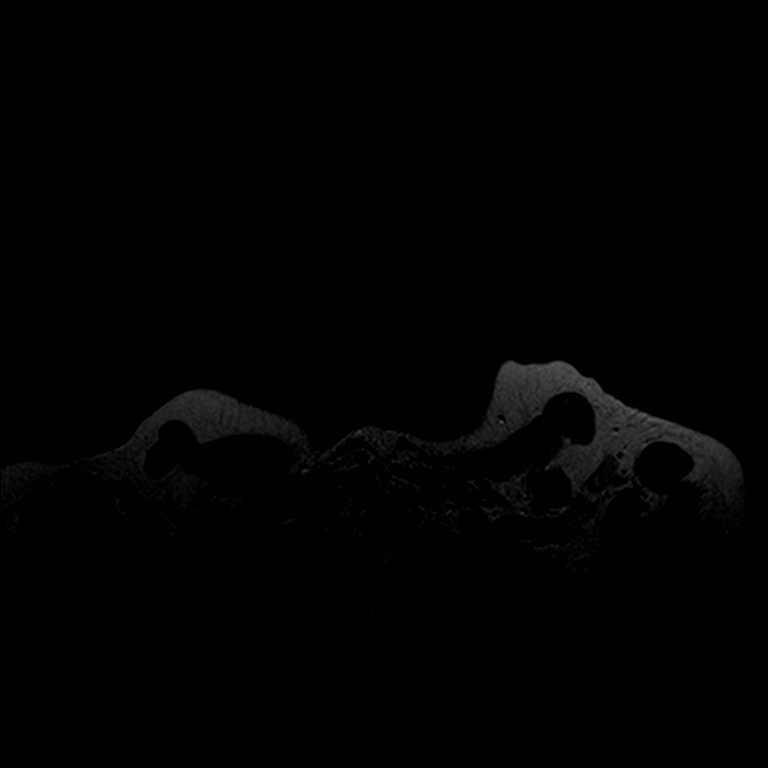

[Series 3: t2_tirm_tra ipat (a-p) · axial · 3.0mm · 0.70mm/px · z∈[-79,+83]mm · 2 of 55 slices shown]
[im 1/55]
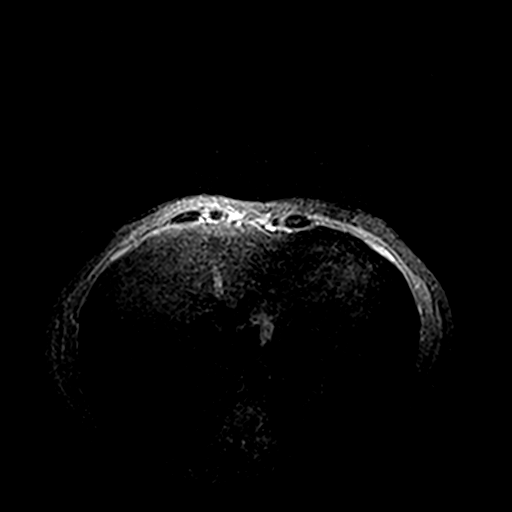
[im 55/55]
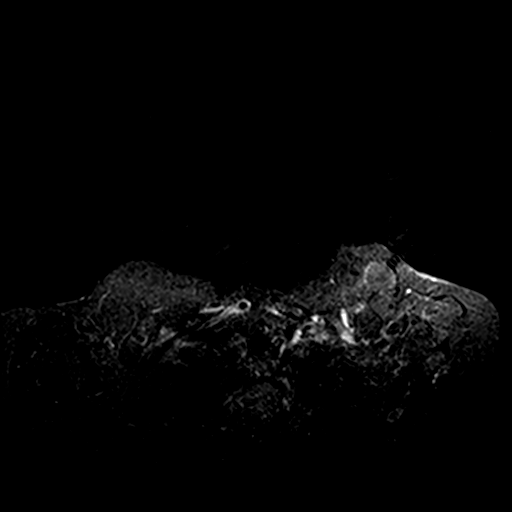

[Series 4: fl3d pre-cm no · axial · non-contrast · 1.2mm · 0.94mm/px · z∈[-84,+88]mm · 5 of 144 slices shown]
[im 1/144]
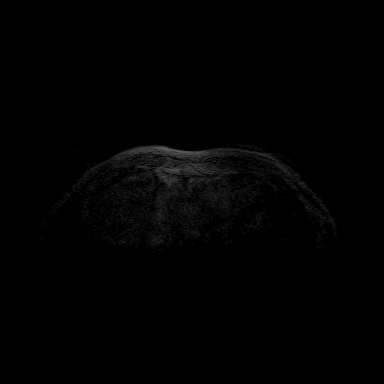
[im 36/144]
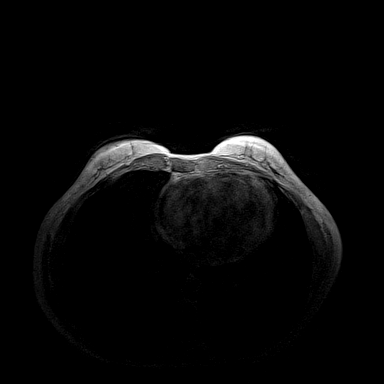
[im 72/144]
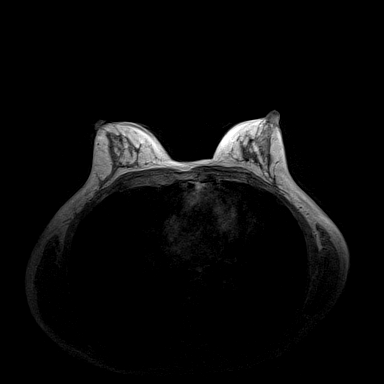
[im 108/144]
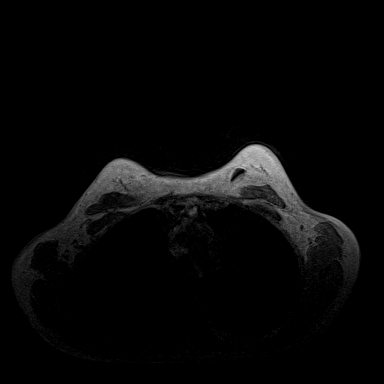
[im 144/144]
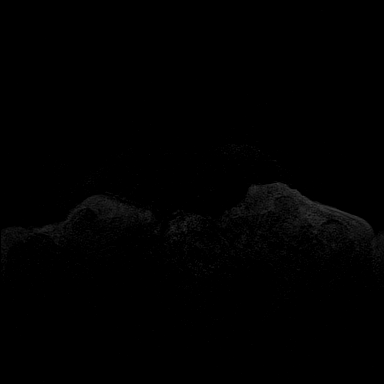

[Series 5: fl3d pre-cm · axial · non-contrast · 1.2mm · 0.94mm/px · z∈[-84,+88]mm · 5 of 144 slices shown]
[im 1/144]
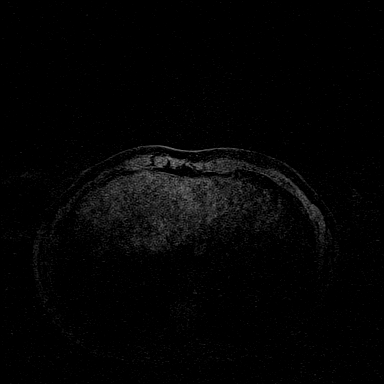
[im 36/144]
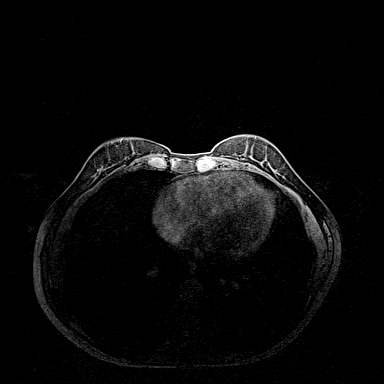
[im 72/144]
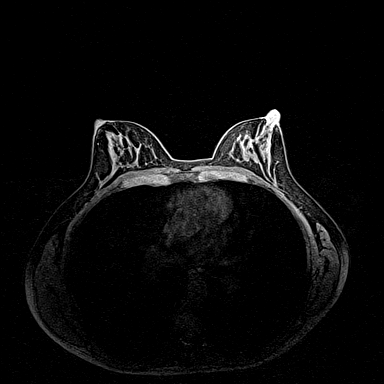
[im 108/144]
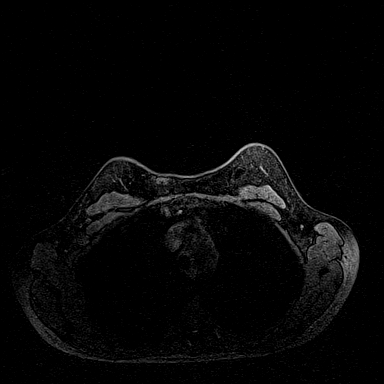
[im 144/144]
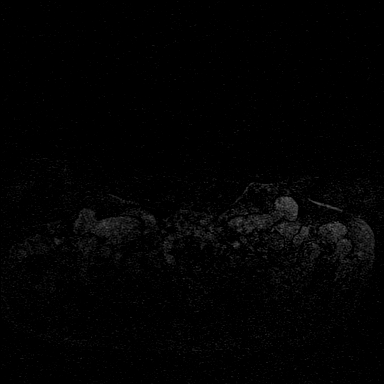

[Series 6: fl3d post immediate · axial · 1.2mm · 0.94mm/px · z∈[-84,+88]mm · 5 of 144 slices shown (1 of 2)]
[im 1/144]
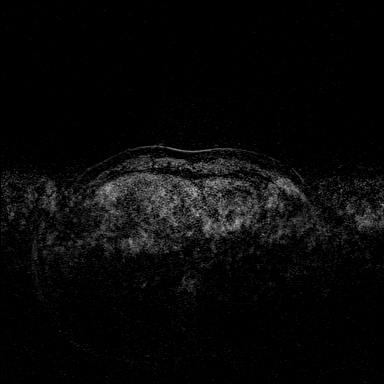
[im 36/144]
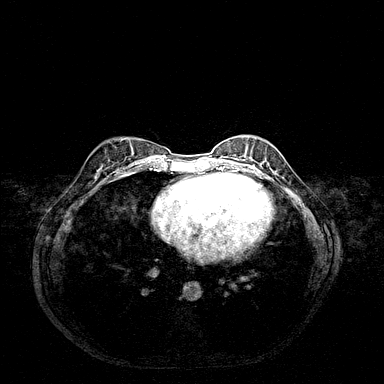
[im 72/144]
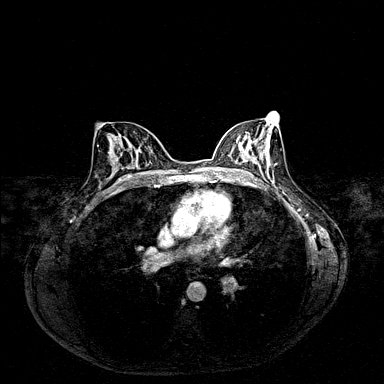
[im 108/144]
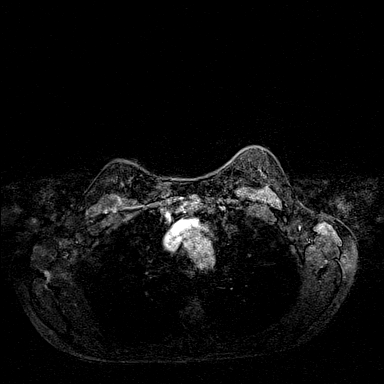
[im 144/144]
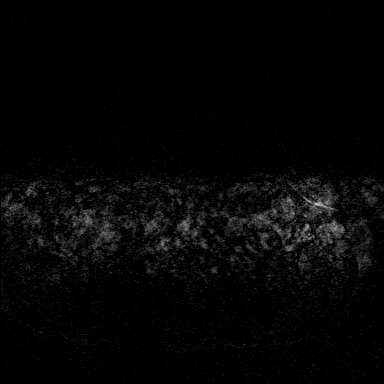

[Series 7: fl3d post immediate · axial · 1.2mm · 0.94mm/px · z∈[-84,+1]mm · 3 of 144 slices shown (2 of 2)]
[im 1/144]
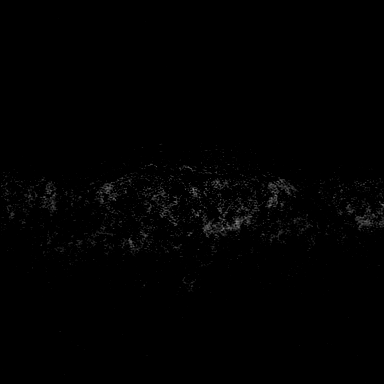
[im 36/144]
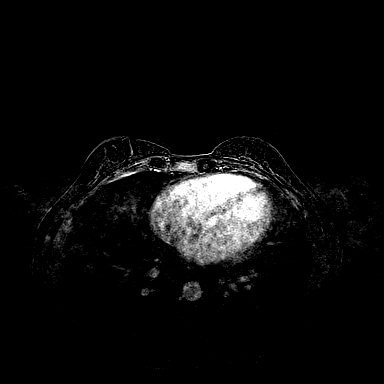
[im 72/144]
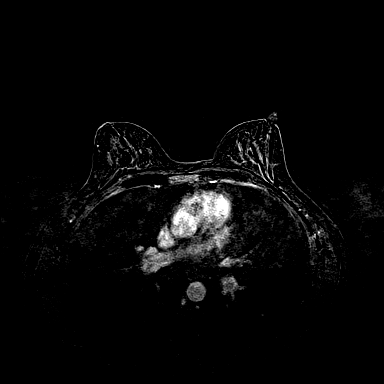

[23 of 48 positions shown; findings below may reference images not displayed]

THREE-DIMENSIONAL MR IMAGE RENDERING ON INDEPENDENT WORKSTATION:

Three-dimensional MR images were rendered by post-processing of the
original MR data on an independent workstation. The
three-dimensional MR images were interpreted, and findings are
reported in the following complete MRI report for this study. Three
dimensional images were evaluated at the independent [employer]
workstation
FINDINGS: Breast composition: c.  Heterogeneous fibroglandular tissue.

Background parenchymal enhancement: Marked.

Right breast: Biopsy clip artifact is present and the central to
slightly outer right breast at site of biopsy proven malignancy.
Previously seen right breast retroareolar non mass enhancement is no
longer identified. There is no abnormal areas of enhancement or
suspicious enhancing masses seen in the right breast.

Left breast: No suspicious enhancing masses or abnormal areas of
enhancement in the left breast to suggest malignancy.

Lymph nodes: No morphologically abnormal axillary lymph nodes. No
internal mammary lymphadenopathy.

Ancillary findings:  A left chest wall port is present.
IMPRESSION: 1. Known right breast malignancy, with resolution of previously seen
non mass enhancement involving the retroareolar right breast
compatible with treatment response. No new masses or new suspicious
areas of malignancy seen in the right breast.

2.  No MRI evidence of left breast malignancy.

RECOMMENDATION:
Treatment plan for known right breast malignancy.

BI-RADS CATEGORY  6: Known biopsy-proven malignancy.

## 2016-01-28 ENCOUNTER — Ambulatory Visit (HOSPITAL_BASED_OUTPATIENT_CLINIC_OR_DEPARTMENT_OTHER): Payer: Managed Care, Other (non HMO) | Admitting: Nurse Practitioner

## 2016-01-28 ENCOUNTER — Encounter: Payer: Self-pay | Admitting: Nurse Practitioner

## 2016-01-28 VITALS — BP 107/82 | HR 66 | Temp 98.2°F | Resp 18 | Ht 62.5 in | Wt 126.2 lb

## 2016-01-28 DIAGNOSIS — Z171 Estrogen receptor negative status [ER-]: Secondary | ICD-10-CM | POA: Diagnosis not present

## 2016-01-28 DIAGNOSIS — C50919 Malignant neoplasm of unspecified site of unspecified female breast: Secondary | ICD-10-CM

## 2016-01-28 DIAGNOSIS — Z1502 Genetic susceptibility to malignant neoplasm of ovary: Secondary | ICD-10-CM

## 2016-01-28 DIAGNOSIS — Z1589 Genetic susceptibility to other disease: Secondary | ICD-10-CM

## 2016-01-28 DIAGNOSIS — C50111 Malignant neoplasm of central portion of right female breast: Secondary | ICD-10-CM | POA: Diagnosis not present

## 2016-01-28 DIAGNOSIS — Z1509 Genetic susceptibility to other malignant neoplasm: Secondary | ICD-10-CM

## 2016-01-28 NOTE — Progress Notes (Signed)
 CLINIC:  Cancer Survivorship   REASON FOR VISIT:  Routine follow-up post-treatment for a recent history of breast cancer.  BRIEF ONCOLOGIC HISTORY:    Cancer of central portion of right female breast ([facility])   11/08/2014 Mammogram Segmental group of heterogeneous calcifications. Calcifications span 2.8 cm anterior posterior x 1.9 cm transverse x 0.6 cm craniocaudal   11/14/2014 Initial Diagnosis Right breast biopsy 8:30 position: Invasive ductal carcinoma with DCIS, grade 2, ER 0%, PR 0%, HER-2 positive ratio 3.53   11/20/2014 Breast MRI Right breast: 3.7 x 2.8 x 1.7 cm area of non-mass enhancement and retroareolar right breast into the lower outer quadrant and lower inner quadrant extending into the nipple involving anterior two thirds of the breast, no lymph nodes   11/20/2014 Clinical Stage Stage IIA: T2 N0   12/04/2014 - 11/27/2015 Neo-Adjuvant Chemotherapy Taxotere, carboplatin, Herceptin, Perjeta 6 cycles followed by Herceptin maintenance for 1 year   12/19/2014 Procedure 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   04/17/2015 Surgery Right Mastectomy: Microscopic focus of DCIS 0.1 cm 0/2 LN Neg. No invasive cancer. Path CR;  Left Mastectomy: Neg   04/17/2015 Pathologic Stage ypTis ypN0   07/04/2015 Surgery Bilateral salpingo-oophorectomy   01/28/2016 Survivorship SCP visit completed    INTERVAL HISTORY:  Ms. Negash presents to the Survivorship Clinic today for our initial meeting to review her survivorship care plan detailing her treatment course for breast cancer, as well as monitoring long-term side effects of that treatment, education regarding health maintenance, screening, and overall wellness and health promotion.    Overall, Ms. Donatelli reports feeling quite well since completing her trastuzumab therapy in May 2017.  She has some mild fatigue and difficulty sleeping, but all in all feels good.  She has completed her reconstruction with Dr. Bowers and denies  any change along either reconstructed breast.  She has continued "lumps" under her right arm which have been evaluated with ultrasound with no anatomical correlate.  She has some continued left pain right under her rib that began about the time of chemotherapy, which comes and goes.  She denies any injury.  She has had no headache, cough, shortness of breath or other bone pain.  She has a good appetite and has been gaining weight, which she is frustrated by.She has some peripheral neuropathy in her bilateral feet.  Her hot flashes have continued following her oopherectomy but unfortunately, she was unable to tolerate the gabapentin.  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   REVIEW OF SYSTEMS:  General: Denies fever, chills, unintentional weight loss, or generalized fatigue.  HEENT: Denies visual changes, hearing loss, mouth sores or difficulty swallowing. Cardiac: Denies palpitations, chest pain, and lower extremity edema.  Respiratory: Denies wheeze or dyspnea on exertion.  Breast: As above.  GI: Denies abdominal pain, constipation, diarrhea, nausea, or vomiting.  GU: Denies dysuria, hematuria, vaginal bleeding, vaginal discharge, or vaginal dryness.  Musculoskeletal: As above.  Neuro: As above. Skin: Denies rash, pruritis, or open wounds.  Psych: Denies depression, anxiety, or memory loss.   A 14-point review of systems was completed and was negative, except as noted above.   ONCOLOGY TREATMENT TEAM:  1. Surgeon:  Dr. Hoxworth at [facility]  2. Medical Oncologist: Dr. Gudena 3. Radiation Oncologist: Dr. Kinard  4. Plastic Surgeon: Dr. Bowers    PAST MEDICAL/SURGICAL HISTORY:  Past Medical   History  Diagnosis Date  . Breast cancer, right breast (HCC)   . Breast cancer (HCC) 2016    ER-/PR-/Her2+  . Family history of breast cancer   .  PALB2-related breast cancer (HCC)   . Complication of anesthesia     takes patient a long time to wake up after anesthesia  . Complication of anesthesia     "blurry vision for 3-4 days after anesthesia"  . PONV (postoperative nausea and vomiting)   . History of bronchitis   . Osteoarthritis of both knees   . History of chemotherapy    Past Surgical History  Procedure Laterality Date  . Tonsilectomy/adenoidectomy with myringotomy    . Tubal ligation    . Knee reconstruction Right   . Tonsillectomy    . Portacath placement N/A 11/30/2014    Procedure: INSERTION PORT-A-CATH;  Surgeon: Benjamin Hoxworth, MD;  Location: [facility];  Service: General;  Laterality: N/A;  . Total mastectomy Bilateral 04/17/2015    Procedure: BILATERAL TOTAL MASTECTOMY;  Surgeon: Benjamin Hoxworth, MD;  Location: [facility] OR;  Service: General;  Laterality: Bilateral;  . Breast reconstruction with placement of tissue expander and flex hd (acellular hydrated dermis) Bilateral 04/17/2015    Procedure: BILATERAL BREAST RECONSTRUCTION WITH PLACEMENT OF TISSUE EXPANDERS;  Surgeon: David Bowers, MD;  Location: [facility] OR;  Service: Plastics;  Laterality: Bilateral;  . Removal of bilateral tissue expanders with placement of bilateral breast implants Bilateral 07/04/2015    Procedure: REMOVAL OF BILATERAL TISSUE EXPANDERS WITH PLACEMENT OF LEFT AND RIGHT BREAST IMPLANTS FOR DELAYED BREAST RECONSCTUCTION;  Surgeon: David Bowers, MD;  Location: [facility] OR;  Service: Plastics;  Laterality: Bilateral;  . Laparoscopic bilateral salpingo oopherectomy Bilateral 07/04/2015    Procedure: LAPAROSCOPIC BILATERAL SALPINGO OOPHORECTOMY;  Surgeon: Mark E Anderson, MD;  Location: [facility] OR;  Service: Gynecology;  Laterality: Bilateral;     ALLERGIES:  Allergies  Allergen Reactions  . Compazine [Prochlorperazine] Nausea And Vomiting    Makes sickness worse     CURRENT MEDICATIONS:  Current Outpatient Prescriptions on File Prior to  Visit  Medication Sig Dispense Refill  . LORazepam (ATIVAN) 0.5 MG tablet TAKE 1 TABLET EVERY 6 HOURS AS NEEDED FOR ANXIETY 30 tablet 0  . Probiotic Product (PROBIOTIC PO) Take 1 capsule by mouth daily.    . gabapentin (NEURONTIN) 100 MG capsule Take 1 capsule (100 mg total) by mouth at bedtime. (Patient not taking: Reported on 01/28/2016) 30 capsule 3   No current facility-administered medications on file prior to visit.     ONCOLOGIC FAMILY HISTORY:  Family History  Problem Relation Age of Onset  . Breast cancer Maternal Aunt     dx in her late 60s  . Breast cancer Paternal Grandmother     dx in her 60s  . Breast cancer Cousin     dx late 40s; BRCA-  . Cancer Cousin     dx in her 40s; leiomyosarcoma     GENETIC COUNSELING/TESTING: Yes, performed 12/19/2015 with Breast/Ovarian Panel revealing pathogenic mutation at PALB2 called [id number].   SOCIAL HISTORY:  Deaysia C Wuertz is married and lives with her family in [place], .  She has 4 children - two sets of twins. 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  She denies any current or history of tobacco, alcohol, or illicit drug use.     PHYSICAL EXAMINATION:  Vital Signs: Filed Vitals:   01/28/16 1440  BP: 107/82  Pulse: 66  Temp: 98.2 F (  36.8 C)  Resp: 18   ECOG Performance Status: 0  General: Well-nourished, well-appearing female in no acute distress.  She is accompanied in clinic by her son and daughter today.   HEENT: Head is atraumatic and normocephalic.  Pupils equal and reactive to light and accomodation. Conjunctivae clear without exudate.  Sclerae anicteric. Oral mucosa is pink, moist, and intact without lesions.  Oropharynx is pink without lesions or erythema.  Lymph: No cervical, supraclavicular, infraclavicular, or axillary lymphadenopathy noted on palpation. Specifically, no discrete mass along right axilla. Cardiovascular: Regular rate and rhythm without murmurs, rubs, or  gallops. Respiratory: Clear to auscultation bilaterally. Chest expansion symmetric without accessory muscle use on inspiration or expiration.  GI: Abdomen soft and round. No tenderness to palpation. Bowel sounds normoactive in 4 quadrants. GU: Deferred.  Neuro: No focal deficits. Steady gait.  Psych: Mood and affect normal and appropriate for situation.  Extremities: No edema, cyanosis, or clubbing.  Skin: Warm and dry. No open lesions noted.   LABORATORY DATA:  None for this visit.  DIAGNOSTIC IMAGING:  None for this visit.     ASSESSMENT AND PLAN:   1. Breast cancer: Clinical stage IIA invasive ductal carcinoma of the right breast (10/2014), PALB2 positive, ER negative, PR negative, HER2/neu positive, S/P neoadjuvant chemotherapy with docetaxel, carboplatin, trastuzumab and pertuzumab with maintenance trastuzumab to complete one year of therapy, with no evidence of invasive disease at time of right mastectomy (03/2015) with only DCIS remaining, S/P bilateral salphingoopherectomy (06/2015).  Ms. Quillin is doing well without clinical symptoms worrisome for disease recurrence. She will follow-up with her medical oncologist,  Dr. Gudena, in August 2017 with history and physical examination per surveillance protocol. A comprehensive survivorship care plan and treatment summary was reviewed with the patient today detailing her breast cancer diagnosis, treatment course, potential late/long-term effects of treatment, appropriate follow-up care with recommendations for the future, and patient education resources.  A copy of this summary, along with a letter will be sent to the patient's primary care provider via in basket message after today's visit.  Ms. Homan is welcome to return to the Survivorship Clinic in the future, as needed; no follow-up will be scheduled at this time.  I did not appreciate any mass or lesion along right axilla but asked Ms. Heymann to report if she notes any change in this area so  that we could pursue additional workup following her recent negative ultrasound.  Similarly, with the intermittent pain that she has experienced in her left lower chest / side, I believe that this is likely chest wall in origin but if it persists, I have asked her to inform us so that we can proceed with additional evaluation.  She has agreed to do so.  2. Cancer screening:  Due to Ms. Charles's history and her age, she should receive screening for skin cancers, colon cancer (beginning at age 50), and gynecologic cancers.  The information and recommendations are listed on the patient's comprehensive care plan/treatment summary and were reviewed in detail with the patient.    3. Health maintenance and wellness promotion: Ms. Whitcomb was encouraged to consume 5-7 servings of fruits and vegetables per day. We reviewed the "Nutrition Rainbow" handout, as well as discussed recommendations to maximize nutrition and minimize recurrence, such as increased intake of fruits, vegetables, lean proteins, and minimizing the intake of red meats and processed foods.  She was also encouraged to engage in moderate to vigorous exercise for 30 minutes per day most days of   the week. We discussed the [employer] fitness program, which is designed for cancer survivors to help them become more physically fit after cancer treatments.  She was instructed to limit her alcohol consumption and continue to abstain from tobacco use.  A copy of the "Take Control of Your Health" brochure was given to her reinforcing these recommendations.   4. Support services/counseling: It is not uncommon for this period of the patient's cancer care trajectory to be one of many emotions and stressors.  We discussed an opportunity for her to participate in the next session of FY[facility] ("Finding Your New Normal") support group series designed for patients after they have completed treatment.   Ms. Straus was encouraged to take advantage of our many other support  services programs, support groups, and/or counseling in coping with her new life as a cancer survivor after completing anti-cancer treatment.  She was offered support today through active listening and expressive supportive counseling.  She was given information regarding our available services and encouraged to contact me with any questions or for help enrolling in any of our support group/programs.    A total of 60 minutes of face-to-face time was spent with this patient with greater than 50% of that time in counseling and care-coordination.   Heather Thompson Mackey, NP  Survivorship Program [facility] [phone]   Note: PRIMARY CARE PROVIDER DEBBIE SCHOENHOFF, MD [phone]    

## 2016-02-23 IMAGING — CR DG CHEST 1V PORT
1 series · 1 of 1 positions shown · non-contrast
Comparison: November 30, 2014

CLINICAL DATA: Fever.  History of breast carcinoma

EXAM:
PORTABLE CHEST 1 VIEW

[AP]
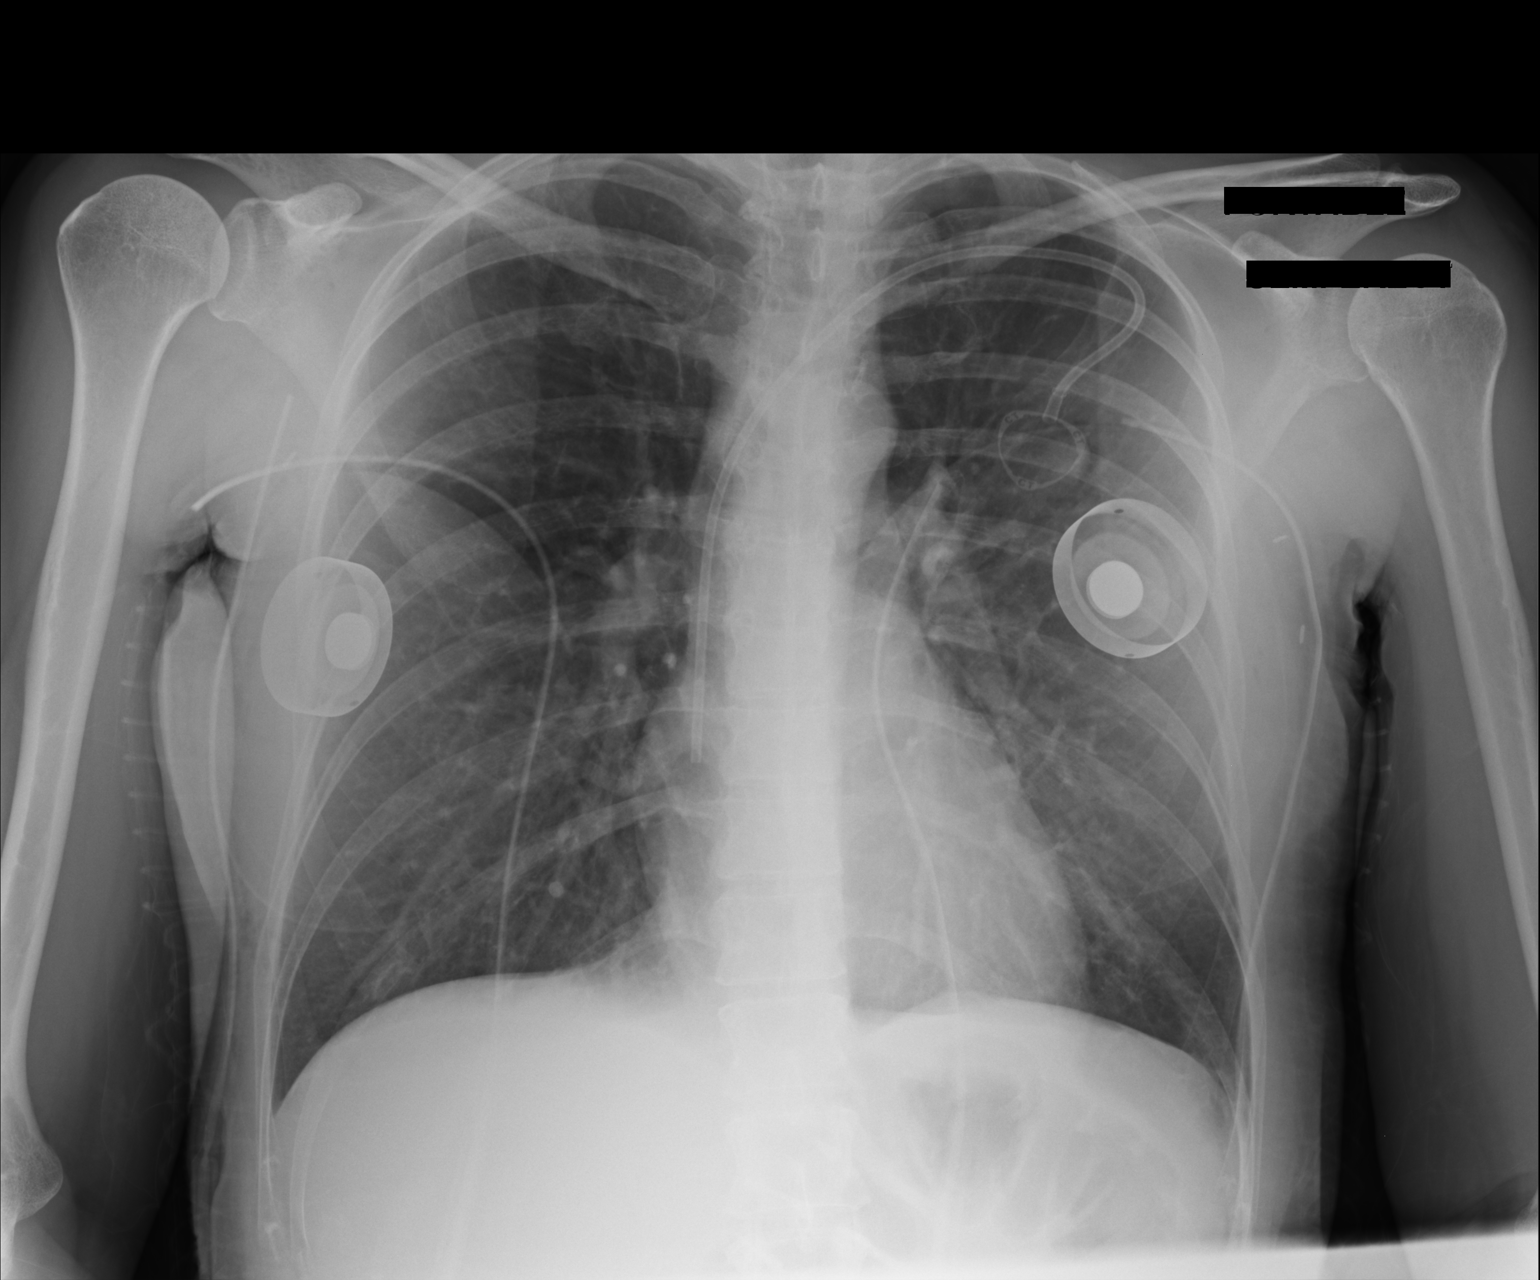

[1 of 1 positions shown; findings below may reference images not displayed]

FINDINGS: Port-A-Cath tip is at the cavoatrial junction. No pneumothorax.
Lungs are clear. Heart size and pulmonary vascular normal. No
adenopathy. Patient has had bilateral mastectomies with tissue
expanders present bilaterally. There are drains on each side. No
apparent bone lesions.
IMPRESSION: Postoperative change. Port-A-Cath tip at cavoatrial junction. No
pneumothorax. Lungs clear.

## 2016-02-27 ENCOUNTER — Telehealth: Payer: Self-pay | Admitting: Hematology and Oncology

## 2016-02-27 ENCOUNTER — Ambulatory Visit (HOSPITAL_BASED_OUTPATIENT_CLINIC_OR_DEPARTMENT_OTHER): Payer: Managed Care, Other (non HMO) | Admitting: Hematology and Oncology

## 2016-02-27 ENCOUNTER — Encounter: Payer: Self-pay | Admitting: Hematology and Oncology

## 2016-02-27 DIAGNOSIS — N951 Menopausal and female climacteric states: Secondary | ICD-10-CM | POA: Diagnosis not present

## 2016-02-27 DIAGNOSIS — C50111 Malignant neoplasm of central portion of right female breast: Secondary | ICD-10-CM | POA: Diagnosis not present

## 2016-02-27 NOTE — Progress Notes (Signed)
Patient Care Team: Lanice Shirts, MD as PCP - General (Internal Medicine) Nicholas Lose, MD as Consulting Physician (Hematology and Oncology) Excell Seltzer, MD as Consulting Physician (General Surgery) Crissie Reese, MD as Consulting Physician (Plastic Surgery) Gery Pray, MD as Consulting Physician (Radiation Oncology)  DIAGNOSIS: Cancer of central portion of right female breast [facility])   Staging form: Breast, AJCC 7th Edition   - Clinical stage from 11/20/2014: Stage IIA (T2, N0, M0) - Unsigned   - Pathologic stage from 04/17/2015: Stage 0 (yTis (DCIS), N0, cM0) - Unsigned  SUMMARY OF ONCOLOGIC HISTORY:   Cancer of central portion of right female breast ([place])   11/08/2014 Mammogram    Segmental group of heterogeneous calcifications. Calcifications span 2.8 cm anterior posterior x 1.9 cm transverse x 0.6 cm craniocaudal     11/14/2014 Initial Diagnosis    Right breast biopsy 8:30 position: Invasive ductal carcinoma with DCIS, grade 2, ER 0%, PR 0%, HER-2 positive ratio 3.53     11/20/2014 Breast MRI    Right breast: 3.7 x 2.8 x 1.7 cm area of non-mass enhancement and retroareolar right breast into the lower outer quadrant and lower inner quadrant extending into the nipple involving anterior two thirds of the breast, no lymph nodes     11/20/2014 Clinical Stage    Stage IIA: T2 N0     12/04/2014 - 11/27/2015 Neo-Adjuvant Chemotherapy    Taxotere, carboplatin, Herceptin, Perjeta 6 cycles followed by Herceptin maintenance for 1 year     12/19/2014 Procedure    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     04/17/2015 Surgery    Right Mastectomy: Microscopic focus of DCIS 0.1 cm 0/2 LN Neg. No invasive cancer. Path CR;  Left Mastectomy: Neg     04/17/2015 Pathologic Stage    ypTis ypN0     07/04/2015 Surgery    Bilateral salpingo-oophorectomy     01/28/2016 Survivorship    SCP visit completed      CHIEF COMPLIANT: Surveillance of breast  cancer  INTERVAL HISTORY: Robin Miles is a 40 year old with above-mentioned history of right breast cancer treated with neoadjuvant chemotherapy followed by mastectomy. She also got bilateral salpingo-oophorectomy. She is here for routine follow-up. She reports no major problems or concerns. She does have hot flashes. She is constantly worried about breast cancer recurrence. 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  REVIEW OF SYSTEMS:   Constitutional: Denies fevers, chills or abnormal weight loss Eyes: Denies blurriness of vision Ears, nose, mouth, throat, and face: Denies mucositis or sore throat Respiratory: Denies cough, dyspnea or wheezes Cardiovascular: Denies palpitation, chest discomfort Gastrointestinal:  Denies nausea, heartburn or change in bowel habits Skin: Denies abnormal skin rashes Lymphatics: Denies new lymphadenopathy or easy bruising Neurological:Denies numbness, tingling or new weaknesses Behavioral/Psych: Mood is stable, no new changes  Extremities: No lower extremity edema Breast:  denies any pain or lumps or nodules  All other systems were reviewed with the patient and are negative.  I have reviewed the past medical history, past surgical history, social history and family history with the patient and they are unchanged from previous note.  ALLERGIES:  is allergic to compazine [prochlorperazine].  MEDICATIONS:  Current Outpatient Prescriptions  Medication Sig Dispense Refill  . gabapentin (NEURONTIN) 100 MG capsule Take 1 capsule (100 mg total) by mouth at bedtime. (Patient not taking: Reported on 01/28/2016) 30 capsule 3  . LORazepam (ATIVAN) 0.5 MG tablet TAKE 1 TABLET EVERY  6 HOURS AS NEEDED FOR ANXIETY 30 tablet 0  . Probiotic Product (PROBIOTIC PO) Take 1 capsule by mouth daily.     No current facility-administered medications for this visit.     PHYSICAL EXAMINATION: ECOG PERFORMANCE STATUS: 1 - Symptomatic but completely  ambulatory  Vitals:   02/27/16 0919  BP: 105/69  Pulse: 62  Resp: 18  Temp: 98.7 F (37.1 C)   Filed Weights   02/27/16 0919  Weight: 125 lb (56.7 kg)    GENERAL:alert, no distress and comfortable SKIN: skin color, texture, turgor are normal, no rashes or significant lesions EYES: normal, Conjunctiva are pink and non-injected, sclera clear OROPHARYNX:no exudate, no erythema and lips, buccal mucosa, and tongue normal  NECK: supple, thyroid normal size, non-tender, without nodularity LYMPH:  no palpable lymphadenopathy in the cervical, axillary or inguinal LUNGS: clear to auscultation and percussion with normal breathing effort HEART: regular rate & rhythm and no murmurs and no lower extremity edema ABDOMEN:abdomen soft, non-tender and normal bowel sounds MUSCULOSKELETAL:no cyanosis of digits and no clubbing  NEURO: alert & oriented x 3 with fluent speech, no focal motor/sensory deficits EXTREMITIES: No lower extremity edema BREAST: No palpable masses or nodules in either right or left breasts. No palpable axillary supraclavicular or infraclavicular adenopathy no breast tenderness or nipple discharge. (exam performed in the presence of a chaperone)  LABORATORY DATA:  I have reviewed the data as listed   Chemistry      Component Value Date/Time   NA 141 11/27/2015 1345   K 3.9 11/27/2015 1345   CL 105 06/27/2015 1040   CO2 27 11/27/2015 1345   BUN 21.9 11/27/2015 1345   CREATININE 1.2 (H) 11/27/2015 1345      Component Value Date/Time   CALCIUM 9.8 11/27/2015 1345   ALKPHOS 74 11/27/2015 1345   AST 14 11/27/2015 1345   ALT 13 11/27/2015 1345   BILITOT 0.78 11/27/2015 1345       Lab Results  Component Value Date   WBC 7.8 11/27/2015   HGB 13.3 11/27/2015   HCT 39.3 11/27/2015   MCV 85.2 11/27/2015   PLT 229 11/27/2015   NEUTROABS 4.4 11/27/2015     ASSESSMENT & PLAN:  Cancer of central portion of right female breast Right breast retroareolar mass 3.7 x 2.8  x 1.7 cm involving anterior two thirds of the breast, T2 N0 M0 stage II a clinical stage, ER 0%, appears 0%, HER-2 positive ratio 3.53, PALB 2 mutation,   Treatment summary:  Completed neo-adjuvant chemotherapy with TCH Perjeta started 12/04/2014 and completed 03/20/2015  Right Mastectomy 04/17/15: Microscopic focus of DCIS 0.1 cm 0/2 LN Neg. No invasive cancer. Path CR; Left Mastectomy: Neg  Completed Herceptin maintenance 11/27/2015  Arthritis in her knees and hips   Severe hot flashes since she got oophorectomy: She could not tolerate neurontin. Echocardiogram April 2017: EF 55-60%  Patient was interested in taking Neratinib: I discussed with her that adding Neratinib increases risk of diarrhea, liver function changes, fatigue as well as other adverse effects for about 1.5% absolute improvement and relapse free survival. She will need to take antidiarrheal prophylaxis with loperamide as follows Days 1 to 14: Loperamide 4 mg orally 3 times daily Days 15 to 56: Loperamide 4 mg orally twice daily Days 57 to 365: Loperamide 4 mg as needed (maximum: 16 mg/day) The dose is 240 mg once daily for 1 year Patient will decide on whether or not she wants to take Neratinib by September 2017. The duration  of therapy would be for 1 year.  RTC in 6 months or sooner if she decides on taking Neratinib   No orders of the defined types were placed in this encounter.  The patient has a good understanding of the overall plan. she agrees with it. she will call with any problems that may develop before the next visit here.   Rulon Eisenmenger, MD 02/27/16

## 2016-02-27 NOTE — Assessment & Plan Note (Signed)
Right breast retroareolar mass 3.7 x 2.8 x 1.7 cm involving anterior two thirds of the breast, T2 N0 M0 stage II a clinical stage, ER 0%, appears 0%, HER-2 positive ratio 3.53, PALB 2 mutation,   Treatment summary:  Completed neo-adjuvant chemotherapy with TCH Perjeta started 12/04/2014 and completed 03/20/2015  Right Mastectomy 04/17/15: Microscopic focus of DCIS 0.1 cm 0/2 LN Neg. No invasive cancer. Path CR; Left Mastectomy: Neg  Completed Herceptin maintenance 11/27/2015  Arthritis in her knees and hips   Severe hot flashes since she got oophorectomy: She could not tolerate neurontin. Echocardiogram April 2017: EF 55-60%  Patient was interested in taking Neratinib: I discussed with her that adding Neratinib increases risk of diarrhea, liver function changes, fatigue as well as other adverse effects for about 1.5% absolute improvement and relapse free survival. She will need to take antidiarrheal prophylaxis with loperamide as follows Days 1 to 14: Loperamide 4 mg orally 3 times daily Days 15 to 56: Loperamide 4 mg orally twice daily Days 57 to 365: Loperamide 4 mg as needed (maximum: 16 mg/day) The dose is 240 mg once daily for 1 year  RTC in  months

## 2016-02-27 NOTE — Telephone Encounter (Signed)
appt made and avs printed °

## 2016-03-10 ENCOUNTER — Ambulatory Visit (HOSPITAL_COMMUNITY)
Admission: RE | Admit: 2016-03-10 | Discharge: 2016-03-10 | Disposition: A | Discharge status: Home / Self Care | Payer: Managed Care, Other (non HMO) | Source: Ambulatory Visit | Attending: Hematology and Oncology | Admitting: Hematology and Oncology

## 2016-03-10 ENCOUNTER — Other Ambulatory Visit (HOSPITAL_COMMUNITY): Payer: Self-pay | Admitting: Internal Medicine

## 2016-03-10 ENCOUNTER — Ambulatory Visit (HOSPITAL_BASED_OUTPATIENT_CLINIC_OR_DEPARTMENT_OTHER)
Admission: RE | Admit: 2016-03-10 | Discharge: 2016-03-10 | Disposition: A | Discharge status: Home / Self Care | Payer: Managed Care, Other (non HMO) | Source: Ambulatory Visit | Attending: Internal Medicine | Admitting: Internal Medicine

## 2016-03-10 ENCOUNTER — Other Ambulatory Visit (HOSPITAL_COMMUNITY): Payer: Managed Care, Other (non HMO)

## 2016-03-10 VITALS — BP 98/66 | HR 64 | Wt 124.5 lb

## 2016-03-10 DIAGNOSIS — I351 Nonrheumatic aortic (valve) insufficiency: Secondary | ICD-10-CM | POA: Diagnosis not present

## 2016-03-10 DIAGNOSIS — C50111 Malignant neoplasm of central portion of right female breast: Secondary | ICD-10-CM | POA: Insufficient documentation

## 2016-03-10 NOTE — Progress Notes (Signed)
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 Lindi Adie for enrollment into the cardio-oncology program .   SUMMARY OF ONCOLOGIC HISTORY:   Cancer of central portion of right female breast ([place])   11/08/2014 Mammogram    Segmental group of heterogeneous calcifications. Calcifications span 2.8 cm anterior posterior x 1.9 cm transverse x 0.6 cm craniocaudal      11/14/2014 Initial Diagnosis    Right breast biopsy 8:30 position: Invasive ductal carcinoma with DCIS, grade 2, ER 0%, PR 0%, HER-2 positive ratio 3.53      11/20/2014 Breast MRI    Right breast: 3.7 x 2.8 x 1.7 cm area of non-mass enhancement and retroareolar right breast into the lower outer quadrant and lower inner quadrant extending into the nipple involving anterior two thirds of the breast, no lymph nodes      11/20/2014 Clinical Stage    Stage IIA: T2 N0      12/04/2014 - 11/27/2015 Neo-Adjuvant Chemotherapy    Taxotere, carboplatin, Herceptin, Perjeta 6 cycles followed by Herceptin maintenance for 1 year      12/19/2014 Procedure    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      04/17/2015 Surgery    Right Mastectomy: Microscopic focus of DCIS 0.1 cm 0/2 LN Neg. No invasive cancer. Path CR;  Left Mastectomy: Neg      04/17/2015 Pathologic Stage    ypTis ypN0      07/04/2015 Surgery    Bilateral salpingo-oophorectomy      01/28/2016 Survivorship    SCP visit completed       She completed Taxotere, carboplatin, Herceptin, Perjeta 6 cycles followed by  Herceptin maintenance for 1 year finished in 5/17. Tolerated well. Now contemplating long-term HER-2 therapy with a new agent neratinib. Still working 2 days per week. Has h/o palpitations but no other heart problems. Active with 7 kids (including 2 sets of twins). Goes to gym regularly.   Echo 03/10/16 EF 55-60% Normal RV. LS' 11.2 cm/s GLS -20.3%    REVIEW OF SYSTEMS:   Constitutional: Denies fevers, chills or abnormal weight loss Eyes: Denies blurriness of vision Ears, nose, mouth, throat, and face: Denies mucositis or sore throat Respiratory: Denies cough, dyspnea or wheezes Cardiovascular: Denies palpitation, chest discomfort Gastrointestinal:  Denies nausea, heartburn or change in bowel habits Skin: Denies abnormal skin rashes Lymphatics: Denies new lymphadenopathy or easy bruising Neurological:Denies numbness, tingling or new weaknesses Behavioral/Psych: Mood is stable, no new changes  Extremities: No lower extremity edema Breast:  denies any pain or lumps or nodules  All other systems were reviewed with the patient and are negative.  I have reviewed the past medical history, past surgical history, social history and family history with the patient and they are unchanged from previous note.  ALLERGIES:  is allergic to compazine [prochlorperazine].  MEDICATIONS:  Current Outpatient Prescriptions  Medication Sig Dispense Refill  . LORazepam (ATIVAN) 0.5 MG tablet TAKE 1 TABLET EVERY 6 HOURS AS NEEDED FOR ANXIETY 30 tablet 0  . Probiotic Product (PROBIOTIC PO) Take 1 capsule by mouth daily.     No current  facility-administered medications for this encounter.     PHYSICAL EXAMINATION: ECOG PERFORMANCE STATUS: 1 - Symptomatic but completely ambulatory  Vitals:   03/10/16 1519  BP: 98/66  Pulse: 64   Filed Weights   03/10/16 1519  Weight: 124 lb 8 oz (56.5 kg)   PHYSICAL EXAM: General:  Well appearing. No resp difficulty HEENT: normal Neck: supple. no JVD. Carotids 2+  bilat; no bruits. No lymphadenopathy or thryomegaly appreciated. Cor: PMI nondisplaced. Regular rate & rhythm. No rubs, gallops or murmurs. Lungs: clear Abdomen: soft, nontender, nondistended. No hepatosplenomegaly. No bruits or masses. Good bowel sounds. Extremities: no cyanosis, clubbing, rash, edema Neuro: alert & orientedx3, cranial nerves grossly intact. moves all 4 extremities w/o difficulty. Affect pleasant  LABORATORY DATA:  I have reviewed the data as listed   Chemistry      Component Value Date/Time   NA 141 11/27/2015 1345   K 3.9 11/27/2015 1345   CL 105 06/27/2015 1040   CO2 27 11/27/2015 1345   BUN 21.9 11/27/2015 1345   CREATININE 1.2 (H) 11/27/2015 1345      Component Value Date/Time   CALCIUM 9.8 11/27/2015 1345   ALKPHOS 74 11/27/2015 1345   AST 14 11/27/2015 1345   ALT 13 11/27/2015 1345   BILITOT 0.78 11/27/2015 1345       Lab Results  Component Value Date   WBC 7.8 11/27/2015   HGB 13.3 11/27/2015   HCT 39.3 11/27/2015   MCV 85.2 11/27/2015   PLT 229 11/27/2015   NEUTROABS 4.4 11/27/2015     ASSESSMENT & PLAN:  1. R breast CA, ER/PR - HER-2 + --s/p Taxotere, carboplatin, Herceptin, Perjeta 6 cycles followed by Herceptin maintenance for 1 year completed 5/17 without cardiotoxicity --I reviewed echos personally. EF and Doppler parameters stable. No HF on exam.  --Now contemplating Neratanib therapy I discussed the fact with her that so far in Phase I-II trials no cardiotoxicity reported with Neratanib. --If decides to proceed will continue echos every 3 months    Glori Bickers, MD 02/27/16

## 2016-03-10 NOTE — Progress Notes (Signed)
  Echocardiogram 2D Echocardiogram has been performed.  Darlina Sicilian M 03/10/2016, 2:23 PM

## 2016-03-10 NOTE — Patient Instructions (Signed)
We will contact you in 4 months to schedule your next appointment and echocardiogram  

## 2016-03-13 ENCOUNTER — Telehealth: Payer: Self-pay

## 2016-03-13 NOTE — Telephone Encounter (Signed)
Returned call to pt re: neratinib.  Let pt know per Dr. Lindi Adie, he will take care of this on Monday.  Pt voiced understanding and requested needed lab draw with her next flush appt on 8/30.  I adviised pt I would be here Monday and would help coordinate that for her.  Pt reports dizziness when looking up and down, and when scrolling on computer/phone.  I advised pt I would discuss on Monday and call her back.  Pt voiced understanding.

## 2016-03-16 ENCOUNTER — Other Ambulatory Visit: Payer: Self-pay

## 2016-03-16 ENCOUNTER — Other Ambulatory Visit: Payer: Self-pay | Admitting: Hematology and Oncology

## 2016-03-16 DIAGNOSIS — C50111 Malignant neoplasm of central portion of right female breast: Secondary | ICD-10-CM

## 2016-03-16 MED ORDER — NERATINIB MALEATE 40 MG PO TABS: 240.0000 mg | ORAL_TABLET | Freq: Every day | ORAL | 6 refills | Status: DC

## 2016-03-18 ENCOUNTER — Other Ambulatory Visit (HOSPITAL_BASED_OUTPATIENT_CLINIC_OR_DEPARTMENT_OTHER): Payer: Managed Care, Other (non HMO)

## 2016-03-18 ENCOUNTER — Encounter: Payer: Self-pay | Admitting: Pharmacist

## 2016-03-18 ENCOUNTER — Ambulatory Visit (HOSPITAL_BASED_OUTPATIENT_CLINIC_OR_DEPARTMENT_OTHER): Payer: Managed Care, Other (non HMO)

## 2016-03-18 DIAGNOSIS — Z95828 Presence of other vascular implants and grafts: Secondary | ICD-10-CM | POA: Insufficient documentation

## 2016-03-18 DIAGNOSIS — Z1509 Genetic susceptibility to other malignant neoplasm: Secondary | ICD-10-CM

## 2016-03-18 DIAGNOSIS — C50111 Malignant neoplasm of central portion of right female breast: Secondary | ICD-10-CM | POA: Diagnosis not present

## 2016-03-18 DIAGNOSIS — C50919 Malignant neoplasm of unspecified site of unspecified female breast: Secondary | ICD-10-CM

## 2016-03-18 DIAGNOSIS — Z1502 Genetic susceptibility to malignant neoplasm of ovary: Secondary | ICD-10-CM

## 2016-03-18 DIAGNOSIS — Z1589 Genetic susceptibility to other disease: Secondary | ICD-10-CM

## 2016-03-18 LAB — COMPREHENSIVE METABOLIC PANEL
ALT: 9 U/L (ref 0–55)
AST: 14 U/L (ref 5–34)
Albumin: 3.6 g/dL (ref 3.5–5.0)
Alkaline Phosphatase: 73 U/L (ref 40–150)
Anion Gap: 9 mEq/L (ref 3–11)
BUN: 18.1 mg/dL (ref 7.0–26.0)
CALCIUM: 9.6 mg/dL (ref 8.4–10.4)
CHLORIDE: 107 meq/L (ref 98–109)
CO2: 26 mEq/L (ref 22–29)
CREATININE: 1.1 mg/dL (ref 0.6–1.1)
EGFR: 65 mL/min/{1.73_m2} — ABNORMAL LOW (ref >90)
GLUCOSE: 96 mg/dL (ref 70–140)
Potassium: 3.7 mEq/L (ref 3.5–5.1)
SODIUM: 143 meq/L (ref 136–145)
Total Bilirubin: 0.65 mg/dL (ref 0.20–1.20)
Total Protein: 6.5 g/dL (ref 6.4–8.3)

## 2016-03-18 LAB — CBC WITH DIFFERENTIAL/PLATELET
BASO%: 1.1 % (ref 0.0–2.0)
Basophils Absolute: 0.1 10*3/uL (ref 0.0–0.1)
EOS ABS: 0.4 10*3/uL (ref 0.0–0.5)
EOS%: 5.4 % (ref 0.0–7.0)
HEMATOCRIT: 37.2 % (ref 34.8–46.6)
HEMOGLOBIN: 12.6 g/dL (ref 11.6–15.9)
LYMPH#: 2.7 10*3/uL (ref 0.9–3.3)
LYMPH%: 41.3 % (ref 14.0–49.7)
MCH: 29 pg (ref 25.1–34.0)
MCHC: 33.7 g/dL (ref 31.5–36.0)
MCV: 86.1 fL (ref 79.5–101.0)
MONO#: 0.3 10*3/uL (ref 0.1–0.9)
MONO%: 5.4 % (ref 0.0–14.0)
NEUT#: 3 10*3/uL (ref 1.5–6.5)
NEUT%: 46.8 % (ref 38.4–76.8)
Platelets: 209 10*3/uL (ref 145–400)
RBC: 4.32 10*6/uL (ref 3.70–5.45)
RDW: 14 % (ref 11.2–14.5)
WBC: 6.5 10*3/uL (ref 3.9–10.3)

## 2016-03-18 LAB — MAGNESIUM: MAGNESIUM: 1.8 mg/dL (ref 1.5–2.5)

## 2016-03-18 MED ORDER — HEPARIN SOD (PORK) LOCK FLUSH 100 UNIT/ML IV SOLN
500.0000 [IU] | Freq: Once | INTRAVENOUS | Status: AC | PRN
  Administered 2016-03-18: 500 [IU] via INTRAVENOUS
  Filled 2016-03-18: qty 5

## 2016-03-18 MED ORDER — SODIUM CHLORIDE 0.9 % IJ SOLN
10.0000 mL | INTRAMUSCULAR | Status: DC | PRN
  Administered 2016-03-18: 10 mL via INTRAVENOUS
  Filled 2016-03-18: qty 10

## 2016-03-18 NOTE — Progress Notes (Signed)
Oral Chemotherapy Pharmacist Encounter  Received notification form [facility] that patient's prescription for neratinib 240mg  daily could not be filled at their location and would need to be faxed to [facility].  Labs from 8/30 reviewed, ok for treatment. No DDI's noted with current medication list in Epic.  D/w Dr. Lindi Adie, patient was instructed about prophylaxis of diarrhea while on neratinib.  Rx referral form completed and faxed along with insurance information and last office note to [facility] at [phone].  Oral Chemo Clinic will follow-up for start date, initial counseling, and toxicity/compliance management.   Johny Drilling, PharmD, BCPS Oral Chemotherapy Clinic

## 2016-03-19 ENCOUNTER — Encounter: Payer: Self-pay | Admitting: Pharmacist

## 2016-03-19 NOTE — Progress Notes (Signed)
Oral Chemotherapy Pharmacist Encounter   I received notification from [facility] that they have received prescription for Nerlynx and that prior authorization was required. Additional requested information of office note and prior therapies were sent along with prescription yesterday.  Oral Chemo Clinic will continue to follow.  Johny Drilling, PharmD, BCPS 03/19/2016  1:07 PM Oral Chemotherapy Clinic [phone]

## 2016-03-20 ENCOUNTER — Telehealth: Payer: Self-pay | Admitting: Pharmacist

## 2016-03-20 ENCOUNTER — Telehealth: Payer: Self-pay | Admitting: *Deleted

## 2016-03-20 NOTE — Telephone Encounter (Signed)
Can you please give her an appt on 9/15 with labs CBCD, CMP? Thanks V

## 2016-03-20 NOTE — Telephone Encounter (Signed)
Pt called to report that she will be starting Neratinib on Friday, 9/8.

## 2016-03-20 NOTE — Telephone Encounter (Signed)
I received fax from [facility] stating Rx transferred to [facility] as it is in the pts employer's network. I contacted [facility] and the rep stated Nerlynx was being shipped out today for delivery on Tues 9/5. Pt needs initial counseling.  Will provide by 9/8 (pts start date).  Kennith Center, Pharm.D., CPP 03/20/2016@5 :05 PM Oral Chemo Clinic

## 2016-03-24 NOTE — Telephone Encounter (Signed)
Received request from Dr. Lindi Adie to schedule pt for labs and follow up regarding start of new medication.  Orders for CBC with diff and CMP entered and message routed to scheduling department to schedule for 9/15.

## 2016-03-25 ENCOUNTER — Telehealth: Payer: Self-pay | Admitting: Pharmacist

## 2016-03-25 NOTE — Telephone Encounter (Signed)
I left pt vm on mobile and also sent email [facility] employee email) to have her contact me today, if possible to go over Nerlynx to begin 03/27/16.  Kennith Center, Pharm.D., CPP 03/25/2016@11 :12 AM Oral Chemo Clinic

## 2016-03-25 NOTE — Telephone Encounter (Signed)
Oral Chemotherapy Pharmacist Encounter  I spoke with patient over the phone for overview of new oral chemotherapy medication: Nerlynx. Pt received her supply of Nerlynx yesterday (03/24/16). She will take 240 mg = 6 tabs daily w/ food.  She plans to start this on 03/27/16.  She plans to take her dose in the evenings.  Counseled patient on administration, dosing, side effects, safe handling, and monitoring. Side effects include but not limited to: diarrhea, stomatitis, nausea, abdominal pain, muscle spasms, dry skin, nail disorder, fatigue, weight loss, UTI, increased LFT's.  She voiced understanding and appreciation.   She will use prophylactic Imodium along w/ her Nerlynx: 4 mg PO TID during weeks 1-2, 4 mg PO BID during weeks 3-8, then PRN; MAX = 16 mg/day.  She historically has experienced CINV.  She used Ativan at bedtime.  She had success w/ Emend + Aloxi when she was on IV chemo.  She does not tolerate Compazine (hypotension) and possibly Phenergan (class effect).  Zofran does not work for her either, it just causes constipation.  All current questions answered.  Pt needs LFT's monthly x 3 mos then Q3 mos or as clinically indicated.  No labs scheduled.  Please advise/contact pt to schedule.  Will follow up in 1 week - 10 days for adherence and toxicity management.  Pt is aware.  She has our phone # to the oral chemo clinic and is ok if we send her emails through [facility] email system.   Thank you, Kennith Center, Pharm.D., CPP 03/25/2016@4 :26 PM Oral Chemotherapy Clinic

## 2016-03-26 NOTE — Telephone Encounter (Signed)
Scheduling messaging sent to set up appt for 9/15 to have labs and see Dr. Lindi Adie per his request.  Message entered also to schedule monthly labs x 3 months and then every 3 months.

## 2016-03-27 ENCOUNTER — Telehealth: Payer: Self-pay

## 2016-03-27 NOTE — Telephone Encounter (Signed)
Received VM from pt stating she wished to start Neratinib on Monday PM after working so she can see how medication affects her.  Due to this, pt is requesting to reschedule follow up for 9/18 to coincide with one week follow up.  Called pt back to discuss but unable to reach pt.  Left VM verifying I would relay this information to Dr. Lindi Adie and send a message to our scheduling department to reschedule follow up appt.

## 2016-03-31 ENCOUNTER — Other Ambulatory Visit: Payer: Self-pay

## 2016-03-31 DIAGNOSIS — C50111 Malignant neoplasm of central portion of right female breast: Secondary | ICD-10-CM

## 2016-03-31 DIAGNOSIS — N63 Unspecified lump in unspecified breast: Secondary | ICD-10-CM

## 2016-03-31 NOTE — Progress Notes (Signed)
Received VM from pt stating she had several questions.  Called pt back to discuss.  Pt first informing us that she did not start neratinib last evening as planned but is going to take first dose this evening.  I informed pt I would coordinate her follow up appt to coincide with 1 week from today and our scheduling department would be contacting her with this information.  Also, pt stated she felt a new "lump" and was concerned and unsure of what this might be.  Pt describes new area as a "moveable pea size or smaller lump" and states it is located on the right side close to her sternum between 2 ribs.  I notified Dr. Lindi Adie of this new finding and pt concerns and he recommends pt have [place] of this area.  I called pt back to discuss and she verbalized understanding.  Order entered and pt notified she would be contacted by GI to schedule this appt.  Pt is without further questions or concerns at time of call and knows to contact us with additional questions, should they arise.

## 2016-04-01 ENCOUNTER — Telehealth: Payer: Self-pay | Admitting: *Deleted

## 2016-04-01 ENCOUNTER — Other Ambulatory Visit: Payer: Self-pay | Admitting: Hematology and Oncology

## 2016-04-01 DIAGNOSIS — C50111 Malignant neoplasm of central portion of right female breast: Secondary | ICD-10-CM

## 2016-04-01 NOTE — Telephone Encounter (Signed)
"  I have not heard anything from [facility] about the [place].  Can you help." Advised order was signed yesterday at 4:45 pm.  May not have scheduled yet.  Phone number provided for patient to call GI to request scheduling.  No further questions.

## 2016-04-02 ENCOUNTER — Telehealth: Payer: Self-pay | Admitting: *Deleted

## 2016-04-02 NOTE — Telephone Encounter (Signed)
Call from pt regarding scan. Pt reports scan has not been scheduled due to new order needed. Reviewed with [facility] who advised they have received new order and will call pt to schedule. No further concerns.

## 2016-04-03 ENCOUNTER — Other Ambulatory Visit: Payer: Managed Care, Other (non HMO)

## 2016-04-03 ENCOUNTER — Ambulatory Visit: Payer: Managed Care, Other (non HMO) | Admitting: Hematology and Oncology

## 2016-04-06 ENCOUNTER — Ambulatory Visit
Admission: RE | Admit: 2016-04-06 | Discharge: 2016-04-06 | Disposition: A | Discharge status: Home / Self Care | Payer: Managed Care, Other (non HMO) | Source: Ambulatory Visit | Attending: Hematology and Oncology | Admitting: Hematology and Oncology

## 2016-04-06 DIAGNOSIS — C50111 Malignant neoplasm of central portion of right female breast: Secondary | ICD-10-CM

## 2016-04-07 ENCOUNTER — Ambulatory Visit (HOSPITAL_BASED_OUTPATIENT_CLINIC_OR_DEPARTMENT_OTHER): Payer: Managed Care, Other (non HMO) | Admitting: Hematology and Oncology

## 2016-04-07 ENCOUNTER — Ambulatory Visit (HOSPITAL_BASED_OUTPATIENT_CLINIC_OR_DEPARTMENT_OTHER): Payer: Managed Care, Other (non HMO) | Admitting: Nurse Practitioner

## 2016-04-07 ENCOUNTER — Other Ambulatory Visit: Payer: Managed Care, Other (non HMO)

## 2016-04-07 ENCOUNTER — Telehealth: Payer: Self-pay | Admitting: *Deleted

## 2016-04-07 ENCOUNTER — Ambulatory Visit (HOSPITAL_BASED_OUTPATIENT_CLINIC_OR_DEPARTMENT_OTHER): Payer: Managed Care, Other (non HMO)

## 2016-04-07 ENCOUNTER — Other Ambulatory Visit: Payer: Self-pay

## 2016-04-07 ENCOUNTER — Other Ambulatory Visit (HOSPITAL_BASED_OUTPATIENT_CLINIC_OR_DEPARTMENT_OTHER): Payer: Managed Care, Other (non HMO)

## 2016-04-07 ENCOUNTER — Encounter: Payer: Self-pay | Admitting: Hematology and Oncology

## 2016-04-07 ENCOUNTER — Encounter: Payer: Self-pay | Admitting: *Deleted

## 2016-04-07 VITALS — BP 102/66 | HR 86 | Temp 100.1°F | Resp 18

## 2016-04-07 VITALS — BP 133/86 | HR 79 | Temp 98.6°F | Resp 18 | Wt 124.4 lb

## 2016-04-07 DIAGNOSIS — C50111 Malignant neoplasm of central portion of right female breast: Secondary | ICD-10-CM

## 2016-04-07 DIAGNOSIS — R509 Fever, unspecified: Secondary | ICD-10-CM

## 2016-04-07 DIAGNOSIS — Z1589 Genetic susceptibility to other disease: Principal | ICD-10-CM

## 2016-04-07 DIAGNOSIS — Z1502 Genetic susceptibility to malignant neoplasm of ovary: Secondary | ICD-10-CM

## 2016-04-07 DIAGNOSIS — C50919 Malignant neoplasm of unspecified site of unspecified female breast: Secondary | ICD-10-CM

## 2016-04-07 DIAGNOSIS — Z1509 Genetic susceptibility to other malignant neoplasm: Secondary | ICD-10-CM

## 2016-04-07 LAB — URINALYSIS, MICROSCOPIC - CHCC
Bilirubin (Urine): NEGATIVE
Blood: NEGATIVE
GLUCOSE UR CHCC: NEGATIVE mg/dL
Ketones: NEGATIVE mg/dL
LEUKOCYTE ESTERASE: NEGATIVE
NITRITE: NEGATIVE
PROTEIN: NEGATIVE mg/dL
Specific Gravity, Urine: 1.005 (ref 1.003–1.035)
Urobilinogen, UR: 0.2 mg/dL (ref 0.2–1)
pH: 7.5 (ref 4.6–8.0)

## 2016-04-07 LAB — CBC WITH DIFFERENTIAL/PLATELET
BASO%: 0.3 % (ref 0.0–2.0)
Basophils Absolute: 0 10*3/uL (ref 0.0–0.1)
EOS ABS: 0.1 10*3/uL (ref 0.0–0.5)
EOS%: 1.3 % (ref 0.0–7.0)
HEMATOCRIT: 42 % (ref 34.8–46.6)
HGB: 13.9 g/dL (ref 11.6–15.9)
LYMPH#: 1.1 10*3/uL (ref 0.9–3.3)
LYMPH%: 15.7 % (ref 14.0–49.7)
MCH: 28.5 pg (ref 25.1–34.0)
MCHC: 33 g/dL (ref 31.5–36.0)
MCV: 86.6 fL (ref 79.5–101.0)
MONO#: 0.2 10*3/uL (ref 0.1–0.9)
MONO%: 3.4 % (ref 0.0–14.0)
NEUT%: 79.3 % — ABNORMAL HIGH (ref 38.4–76.8)
NEUTROS ABS: 5.7 10*3/uL (ref 1.5–6.5)
PLATELETS: 213 10*3/uL (ref 145–400)
RBC: 4.85 10*6/uL (ref 3.70–5.45)
RDW: 13.6 % (ref 11.2–14.5)
WBC: 7.2 10*3/uL (ref 3.9–10.3)

## 2016-04-07 LAB — COMPREHENSIVE METABOLIC PANEL
ALT: 11 U/L (ref 0–55)
ANION GAP: 10 meq/L (ref 3–11)
AST: 14 U/L (ref 5–34)
Albumin: 3.9 g/dL (ref 3.5–5.0)
Alkaline Phosphatase: 81 U/L (ref 40–150)
BUN: 15.9 mg/dL (ref 7.0–26.0)
CALCIUM: 9.8 mg/dL (ref 8.4–10.4)
CHLORIDE: 106 meq/L (ref 98–109)
CO2: 26 mEq/L (ref 22–29)
Creatinine: 1.1 mg/dL (ref 0.6–1.1)
EGFR: 62 mL/min/{1.73_m2} — ABNORMAL LOW (ref >90)
Glucose: 90 mg/dl (ref 70–140)
POTASSIUM: 4 meq/L (ref 3.5–5.1)
Sodium: 142 mEq/L (ref 136–145)
Total Bilirubin: 0.99 mg/dL (ref 0.20–1.20)
Total Protein: 6.8 g/dL (ref 6.4–8.3)

## 2016-04-07 MED ORDER — SODIUM CHLORIDE 0.9% FLUSH
10.0000 mL | Freq: Once | INTRAVENOUS | Status: AC
  Administered 2016-04-07: 10 mL via INTRAVENOUS
  Filled 2016-04-07: qty 10

## 2016-04-07 MED ORDER — ACETAMINOPHEN 325 MG PO TABS
ORAL_TABLET | ORAL | Status: AC
  Filled 2016-04-07: qty 2

## 2016-04-07 MED ORDER — HEPARIN SOD (PORK) LOCK FLUSH 100 UNIT/ML IV SOLN
500.0000 [IU] | Freq: Once | INTRAVENOUS | Status: AC
  Administered 2016-04-07: 500 [IU] via INTRAVENOUS
  Filled 2016-04-07: qty 5

## 2016-04-07 MED ORDER — ACETAMINOPHEN 325 MG PO TABS
650.0000 mg | ORAL_TABLET | Freq: Once | ORAL | Status: AC
  Administered 2016-04-07: 650 mg via ORAL

## 2016-04-07 NOTE — Assessment & Plan Note (Signed)
Right breast retroareolar mass 3.7 x 2.8 x 1.7 cm involving anterior two thirds of the breast, T2 N0 M0 stage II a clinical stage, ER 0%, appears 0%, HER-2 positive ratio 3.53, PALB 2 mutation,   Treatment summary:  Completed neo-adjuvant chemotherapy with TCH Perjeta started 12/04/2014 and completed 03/20/2015  Right Mastectomy 04/17/15: Microscopic focus of DCIS 0.1 cm 0/2 LN Neg. No invasive cancer. Path CR; Left Mastectomy: Neg  Completed Herceptin maintenance 11/27/2015  Arthritisin her knees and hips   Severe hot flashessince she got oophorectomy: She could not tolerate neurontin. Echocardiogram April 2017: EF 55-60%   Current treatment: Neratinib started 03/31/2016 Neratinib Toxicities: 1. Decreased appetite 2. diarrhea grade 1  Return to clinic in 6 weeks for lab count check and follow-up.   

## 2016-04-07 NOTE — Progress Notes (Signed)
Patient Care Team: Lanice Shirts, MD as PCP - General (Internal Medicine) Nicholas Lose, MD as Consulting Physician (Hematology and Oncology) Excell Seltzer, MD as Consulting Physician (General Surgery) Crissie Reese, MD as Consulting Physician (Plastic Surgery) Gery Pray, MD as Consulting Physician (Radiation Oncology)  DIAGNOSIS: Cancer of central portion of right female breast [facility])   Staging form: Breast, AJCC 7th Edition   - Clinical stage from 11/20/2014: Stage IIA (T2, N0, M0) - Unsigned   - Pathologic stage from 04/17/2015: Stage 0 (yTis (DCIS), N0, cM0) - Unsigned  SUMMARY OF ONCOLOGIC HISTORY:   Cancer of central portion of right female breast ([place])   11/08/2014 Mammogram    Segmental group of heterogeneous calcifications. Calcifications span 2.8 cm anterior posterior x 1.9 cm transverse x 0.6 cm craniocaudal      11/14/2014 Initial Diagnosis    Right breast biopsy 8:30 position: Invasive ductal carcinoma with DCIS, grade 2, ER 0%, PR 0%, HER-2 positive ratio 3.53      11/20/2014 Breast MRI    Right breast: 3.7 x 2.8 x 1.7 cm area of non-mass enhancement and retroareolar right breast into the lower outer quadrant and lower inner quadrant extending into the nipple involving anterior two thirds of the breast, no lymph nodes      11/20/2014 Clinical Stage    Stage IIA: T2 N0      12/04/2014 - 11/27/2015 Neo-Adjuvant Chemotherapy    Taxotere, carboplatin, Herceptin, Perjeta 6 cycles followed by Herceptin maintenance for 1 year      12/19/2014 Procedure    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      04/17/2015 Surgery    Right Mastectomy: Microscopic focus of DCIS 0.1 cm 0/2 LN Neg. No invasive cancer. Path CR;  Left Mastectomy: Neg      04/17/2015 Pathologic Stage    ypTis ypN0      07/04/2015 Surgery    Bilateral salpingo-oophorectomy      01/28/2016 Survivorship    SCP visit completed      03/31/2016 Miscellaneous   Neratinib Daily       CHIEF COMPLIANT: Follow-up on Neratinib  INTERVAL HISTORY: Robin Miles is a 40 year old with above-mentioned history of right breast cancer treated with neoadjuvant chemotherapy and had a complete response followed by right mastectomy. She also underwent bilateral salpingo-oophorectomy and is currently on oral therapy with Neratinib. She has been on it for a week and reports that she has not had any problems with diarrhea other than 1 day. She denies any nausea vomiting. She has decreased appetite.  REVIEW OF SYSTEMS:   Constitutional: Denies fevers, chills or abnormal weight loss Eyes: Denies blurriness of vision Ears, nose, mouth, throat, and face: Denies mucositis or sore throat Respiratory: Denies cough, dyspnea or wheezes Cardiovascular: Denies palpitation, chest discomfort Gastrointestinal:  Denies nausea, heartburn or change in bowel habits Skin: Denies abnormal skin rashes Lymphatics: Denies new lymphadenopathy or easy bruising Neurological:Denies numbness, tingling or new weaknesses Behavioral/Psych: Mood is stable, no new changes  Extremities: No lower extremity edema  All other systems were reviewed with the patient and are negative.  I have reviewed the past medical history, past surgical history, social history and family history with the patient and they are unchanged from previous note.  ALLERGIES:  is allergic to compazine [prochlorperazine].  MEDICATIONS:  Current Outpatient Prescriptions  Medication Sig Dispense Refill  . LORazepam (ATIVAN) 0.5 MG tablet TAKE 1 TABLET EVERY 6 HOURS AS NEEDED FOR ANXIETY 30 tablet  0  . Neratinib Maleate 40 MG TABS Take 240 mg by mouth daily. 180 tablet 6  . Probiotic Product (PROBIOTIC PO) Take 1 capsule by mouth daily.     No current facility-administered medications for this visit.     PHYSICAL EXAMINATION: ECOG PERFORMANCE STATUS: 1 - Symptomatic but completely ambulatory  Vitals:   04/07/16 1059    BP: 133/86  Pulse: 79  Resp: 18  Temp: 98.6 F (37 C)   Filed Weights   04/07/16 1059  Weight: 124 lb 6.4 oz (56.4 kg)    GENERAL:alert, no distress and comfortable SKIN: skin color, texture, turgor are normal, no rashes or significant lesions EYES: normal, Conjunctiva are pink and non-injected, sclera clear OROPHARYNX:no exudate, no erythema and lips, buccal mucosa, and tongue normal  NECK: supple, thyroid normal size, non-tender, without nodularity LYMPH:  no palpable lymphadenopathy in the cervical, axillary or inguinal LUNGS: clear to auscultation and percussion with normal breathing effort HEART: regular rate & rhythm and no murmurs and no lower extremity edema ABDOMEN:abdomen soft, non-tender and normal bowel sounds MUSCULOSKELETAL:no cyanosis of digits and no clubbing  NEURO: alert & oriented x 3 with fluent speech, no focal motor/sensory deficits EXTREMITIES: No lower extremity edema  LABORATORY DATA:  I have reviewed the data as listed   Chemistry      Component Value Date/Time   NA 142 04/07/2016 1027   K 4.0 04/07/2016 1027   CL 105 06/27/2015 1040   CO2 26 04/07/2016 1027   BUN 15.9 04/07/2016 1027   CREATININE 1.1 04/07/2016 1027      Component Value Date/Time   CALCIUM 9.8 04/07/2016 1027   ALKPHOS 81 04/07/2016 1027   AST 14 04/07/2016 1027   ALT 11 04/07/2016 1027   BILITOT 0.99 04/07/2016 1027       Lab Results  Component Value Date   WBC 7.2 04/07/2016   HGB 13.9 04/07/2016   HCT 42.0 04/07/2016   MCV 86.6 04/07/2016   PLT 213 04/07/2016   NEUTROABS 5.7 04/07/2016     ASSESSMENT & PLAN:  No problem-specific Assessment & Plan notes found for this encounter.   Orders Placed This Encounter  Procedures  . CBC with Differential    Standing Status:   Future    Standing Expiration Date:   04/07/2017  . Comprehensive metabolic panel    Standing Status:   Future    Standing Expiration Date:   04/07/2017   The patient has a good  understanding of the overall plan. she agrees with it. she will call with any problems that may develop before the next visit here.   Rulon Eisenmenger, MD 04/07/16

## 2016-04-07 NOTE — Patient Instructions (Signed)

## 2016-04-07 NOTE — Telephone Encounter (Signed)
"  I was seen there today but now I feel terrible.  Temp = 100.5.  My back aches, skin aches.  I had just drank something cold when temp checked at [facility] because I felt faint with lab draw."  Collaborative nurse notified.  Informed patient to expect return call.

## 2016-04-07 NOTE — Progress Notes (Signed)
Received call from pt stating she left our facility after having lab work and being seen by Dr. Lindi Adie and then started suddenly feeling ill.  Pt stated she was in a staff meeting and did not feel well so she took her temperature which was elevated at 100.5.  Pt states she also had sudden onset of "body, skin, and back aches."  Pt said she felt faint when in for blood work this morning but otherwise had no symptoms.  Reviewed this information with Dr. Lindi Adie who wishes for pt to come in and be seen in [facility].  Called report to Selena Lesser, NP and verbal orders received for urine culture, blood culture x 2, urinalysis, and flu swab.  Called pt to discuss and pt verbalized understanding.  Pt knows to present to our facility now to have lab work and be seen by Selena Lesser, NP.  No further questions or concerns at time of call.

## 2016-04-08 ENCOUNTER — Telehealth: Payer: Self-pay | Admitting: Nurse Practitioner

## 2016-04-08 ENCOUNTER — Encounter: Payer: Self-pay | Admitting: Nurse Practitioner

## 2016-04-08 DIAGNOSIS — R509 Fever, unspecified: Secondary | ICD-10-CM | POA: Insufficient documentation

## 2016-04-08 LAB — INFLUENZA A AND B
INFLUENZA A AG, EIA: NEGATIVE
INFLUENZA B AG, EIA: NEGATIVE

## 2016-04-08 LAB — URINE CULTURE

## 2016-04-08 NOTE — Assessment & Plan Note (Signed)
Patient is currently taking Neratinib oral therapy on a daily basis as directed.  She presented to the [facility] today with acute onset fever, achiness, and sore throat.  See further notes for details of today's visit.  Patient is scheduled to return for labs only on 05/01/2016.  She is scheduled for labs, flush, and visit on 05/19/2016.

## 2016-04-08 NOTE — Progress Notes (Signed)
SYMPTOM MANAGEMENT CLINIC    Chief Complaint: Fever  HPI:  Robin Miles 40 y.o. female diagnosed with breast cancer.  Currently undergoing Neratinib oral therapy.     Cancer of central portion of right female breast ([place])   11/08/2014 Mammogram    Segmental group of heterogeneous calcifications. Calcifications span 2.8 cm anterior posterior x 1.9 cm transverse x 0.6 cm craniocaudal      11/14/2014 Initial Diagnosis    Right breast biopsy 8:30 position: Invasive ductal carcinoma with DCIS, grade 2, ER 0%, PR 0%, HER-2 positive ratio 3.53      11/20/2014 Breast MRI    Right breast: 3.7 x 2.8 x 1.7 cm area of non-mass enhancement and retroareolar right breast into the lower outer quadrant and lower inner quadrant extending into the nipple involving anterior two thirds of the breast, no lymph nodes      11/20/2014 Clinical Stage    Stage IIA: T2 N0      12/04/2014 - 11/27/2015 Neo-Adjuvant Chemotherapy    Taxotere, carboplatin, Herceptin, Perjeta 6 cycles followed by Herceptin maintenance for 1 year      12/19/2014 Procedure    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      04/17/2015 Surgery    Right Mastectomy: Microscopic focus of DCIS 0.1 cm 0/2 LN Neg. No invasive cancer. Path CR;  Left Mastectomy: Neg      04/17/2015 Pathologic Stage    ypTis ypN0      07/04/2015 Surgery    Bilateral salpingo-oophorectomy      01/28/2016 Survivorship    SCP visit completed      03/31/2016 Miscellaneous    Neratinib Daily       Review of Systems  Constitutional: Positive for chills, fever and malaise/fatigue.  HENT: Positive for sore throat.   Musculoskeletal: Positive for myalgias.  All other systems reviewed and are negative.   Past Medical History:  Diagnosis Date  . Breast cancer ([place]) 2016   ER-/PR-/Her2+  . Breast cancer, right breast ([place])   . Complication of anesthesia    takes patient a long time to wake up after anesthesia  .  Complication of anesthesia    "blurry vision for 3-4 days after anesthesia"  . Family history of breast cancer   . History of bronchitis   . History of chemotherapy   . Osteoarthritis of both knees   . 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 PONV (postoperative nausea and vomiting)     Past Surgical History:  Procedure Laterality Date  . BREAST RECONSTRUCTION WITH PLACEMENT OF TISSUE EXPANDER AND FLEX HD (ACELLULAR HYDRATED DERMIS) Bilateral 04/17/2015   Procedure: BILATERAL BREAST RECONSTRUCTION WITH PLACEMENT OF TISSUE EXPANDERS;  Surgeon: Crissie Reese, MD;  Location: [facility];  Service: Plastics;  Laterality: Bilateral;  . KNEE RECONSTRUCTION Right   . LAPAROSCOPIC BILATERAL SALPINGO OOPHERECTOMY Bilateral 07/04/2015   Procedure: LAPAROSCOPIC BILATERAL SALPINGO OOPHORECTOMY;  Surgeon: Olga Millers, MD;  Location: [place];  Service: Gynecology;  Laterality: Bilateral;  . PORTACATH PLACEMENT N/A 11/30/2014   Procedure: INSERTION PORT-A-CATH;  Surgeon: Excell Seltzer, MD;  Location: [place];  Service: General;  Laterality: N/A;  . REMOVAL OF BILATERAL TISSUE EXPANDERS WITH PLACEMENT OF BILATERAL BREAST IMPLANTS Bilateral 07/04/2015   Procedure: REMOVAL OF BILATERAL TISSUE EXPANDERS WITH PLACEMENT OF LEFT AND RIGHT BREAST IMPLANTS FOR DELAYED BREAST RECONSCTUCTION;  Surgeon: Crissie Reese, MD;  Location: [place];  Service: Plastics;  Laterality: Bilateral;  . TONSILECTOMY/ADENOIDECTOMY WITH MYRINGOTOMY    .  TONSILLECTOMY    . TOTAL MASTECTOMY Bilateral 04/17/2015   Procedure: BILATERAL TOTAL MASTECTOMY;  Surgeon: Excell Seltzer, MD;  Location: [place];  Service: General;  Laterality: Bilateral;  . TUBAL LIGATION      has Cancer of central portion of right female breast ([place]); Family history of breast cancer; Genetic testing; PALB2-related breast cancer ([place]); Anxiety; Diarrhea; Port catheter in place; and Fever on her problem list.    is allergic to compazine  [prochlorperazine].    Medication List       Accurate as of 04/07/16 11:59 PM. Always use your most recent med list.          LORazepam 0.5 MG tablet Commonly known as:  ATIVAN TAKE 1 TABLET EVERY 6 HOURS AS NEEDED FOR ANXIETY   Neratinib Maleate 40 MG Tabs Take 240 mg by mouth daily.   PROBIOTIC PO Take 1 capsule by mouth daily.        PHYSICAL EXAMINATION  Oncology Vitals 04/07/2016 04/07/2016  Height - -  Weight - 56.427 kg  Weight (lbs) - 124 lbs 6 oz  BMI (kg/m2) - 22.39 kg/m2  Temp 100.1 98.6  Pulse 86 79  Resp 18 18  SpO2 100 100  BSA (m2) - 1.58 m2   BP Readings from Last 2 Encounters:  04/07/16 102/66  04/07/16 133/86    Physical Exam  Constitutional: She is oriented to person, place, and time and well-developed, well-nourished, and in no distress.  HENT:  Head: Normocephalic and atraumatic.  Posterior oropharynx with erythema; no exudate.  He can.  Managing all secretions.  Eyes: Conjunctivae and EOM are normal. Pupils are equal, round, and reactive to light. Right eye exhibits no discharge. Left eye exhibits no discharge. No scleral icterus.  Neck: Normal range of motion. Neck supple. No JVD present. No tracheal deviation present. No thyromegaly present.  Cardiovascular: Normal rate, regular rhythm, normal heart sounds and intact distal pulses.   Pulmonary/Chest: Effort normal and breath sounds normal. No respiratory distress. She has no wheezes. She has no rales. She exhibits no tenderness.  Abdominal: Soft. Bowel sounds are normal. She exhibits no distension and no mass. There is no tenderness. There is no rebound and no guarding.  Musculoskeletal: Normal range of motion. She exhibits no edema or tenderness.  Lymphadenopathy:    She has no cervical adenopathy.  Neurological: She is alert and oriented to person, place, and time. Gait normal.  Skin: Skin is warm and dry. No rash noted. No erythema. No pallor.  Psychiatric: Affect normal.  Nursing note  and vitals reviewed.   LABORATORY DATA:. Appointment on 04/07/2016  Component Date Value Ref Range Status  . Glucose 04/07/2016 Negative  Negative mg/dL Final  . Bilirubin (Urine) 04/07/2016 Negative  Negative Final  . Ketones 04/07/2016 Negative  Negative mg/dL Final  . Specific Gravity, Urine 04/07/2016 1.005  1.003 - 1.035 Final  . Blood 04/07/2016 Negative  Negative Final  . pH 04/07/2016 7.5  4.6 - 8.0 Final  . Protein 04/07/2016 Negative  Negative- <30 mg/dL Final  . Urobilinogen, UR 04/07/2016 0.2  0.2 - 1 mg/dL Final  . Nitrite 04/07/2016 Negative  Negative Final  . Leukocyte Esterase 04/07/2016 Negative  Negative Final  . RBC / HPF 04/07/2016 0-2  0 - 2 Final  . WBC, UA 04/07/2016 0-2  0 - 2 Final  . Bacteria, UA 04/07/2016 Trace  Negative- Trace Final  . Epithelial Cells 04/07/2016 Few  Negative- Few Final  . Influenza A Ag, EIA  04/08/2016 Negative  Negative Final  . Influenza B Ag, EIA 04/08/2016 Negative  Negative Final  . Influenza Comment 04/08/2016 See note   Final  . Please note: 04/08/2016 Comment   Final   Comment: Because a negative rapid influenza antigen EIA test may not rule-out influenza viral infection, the CDC and other public health agencies have recommended that confirmatory testing using viral culture or nucleic acid amplification should be considered when clinical signs and symptoms are consistent with influenza-like illness, as well as to assist in detecting other respiratory viruses that can produce similar clinical symptoms. (Reference: https://lee-mcguire.com/ guidance_ridt.pdf)   Appointment on 04/07/2016  Component Date Value Ref Range Status  . WBC 04/07/2016 7.2  3.9 - 10.3 10e3/uL Final  . NEUT# 04/07/2016 5.7  1.5 - 6.5 10e3/uL Final  . HGB 04/07/2016 13.9  11.6 - 15.9 g/dL Final  . HCT 04/07/2016 42.0  34.8 - 46.6 % Final  . Platelets 04/07/2016 213  145 - 400 10e3/uL Final  . MCV 04/07/2016 86.6  79.5 -  101.0 fL Final  . MCH 04/07/2016 28.5  25.1 - 34.0 pg Final  . MCHC 04/07/2016 33.0  31.5 - 36.0 g/dL Final  . RBC 04/07/2016 4.85  3.70 - 5.45 10e6/uL Final  . RDW 04/07/2016 13.6  11.2 - 14.5 % Final  . lymph# 04/07/2016 1.1  0.9 - 3.3 10e3/uL Final  . MONO# 04/07/2016 0.2  0.1 - 0.9 10e3/uL Final  . Eosinophils Absolute 04/07/2016 0.1  0.0 - 0.5 10e3/uL Final  . Basophils Absolute 04/07/2016 0.0  0.0 - 0.1 10e3/uL Final  . NEUT% 04/07/2016 79.3* 38.4 - 76.8 % Final  . LYMPH% 04/07/2016 15.7  14.0 - 49.7 % Final  . MONO% 04/07/2016 3.4  0.0 - 14.0 % Final  . EOS% 04/07/2016 1.3  0.0 - 7.0 % Final  . BASO% 04/07/2016 0.3  0.0 - 2.0 % Final  . Sodium 04/07/2016 142  136 - 145 mEq/L Final  . Potassium 04/07/2016 4.0  3.5 - 5.1 mEq/L Final  . Chloride 04/07/2016 106  98 - 109 mEq/L Final  . CO2 04/07/2016 26  22 - 29 mEq/L Final  . Glucose 04/07/2016 90  70 - 140 mg/dl Final  . BUN 04/07/2016 15.9  7.0 - 26.0 mg/dL Final  . Creatinine 04/07/2016 1.1  0.6 - 1.1 mg/dL Final  . Total Bilirubin 04/07/2016 0.99  0.20 - 1.20 mg/dL Final  . Alkaline Phosphatase 04/07/2016 81  40 - 150 U/L Final  . AST 04/07/2016 14  5 - 34 U/L Final  . ALT 04/07/2016 11  0 - 55 U/L Final  . Total Protein 04/07/2016 6.8  6.4 - 8.3 g/dL Final  . Albumin 04/07/2016 3.9  3.5 - 5.0 g/dL Final  . Calcium 04/07/2016 9.8  8.4 - 10.4 mg/dL Final  . Anion Gap 04/07/2016 10  3 - 11 mEq/L Final  . EGFR 04/07/2016 62* >90 ml/min/1.73 m2 Final    RADIOGRAPHIC STUDIES: [place] Chest  Result Date: 04/06/2016 CLINICAL DATA:  Palpable nodule right anterior chest. History of breast cancer. EXAM: CHEST ULTRASOUND COMPARISON:  None. FINDINGS: Scanning limited to the right anterior chest to the right of midline new the sternum. Two small hypoechoic subcutaneous nodules are present measuring 2 mm each. These may represent lymph nodes. No definite mass or adenopathy in the area. IMPRESSION: Two small lymph nodes in the subcutaneous  tissues in the right anterior chest wall. No mass or adenopathy. Electronically Signed   By: Juanda Crumble  Carlis Abbott M.D.   On: 04/06/2016 19:53    ASSESSMENT/PLAN:    Fever Patient states that she had been in the [facility] earlier this morning for a regular follow-up visit with Dr. Lindi Adie states that she was feeling a little tired and achy this morning; but had no further symptoms revealed approximately an hour so of leaving Dr. Geralyn Flash office patient  developed acute onset increased fatigue, all over body aches, and fever to maximum 100.5.  She also  complaint is that as well.  She denies any nausea, vomiting, diarrhea, or UTI symptoms.  Exam today reveals lungs bilaterally with no cough or wheeze.  Patient does not appear to have any nasal congestion on exam.  Posterior oropharynx is mildly red; but no exudate.  Patient does appear mildly flushed, and temperature was 100.1 while in the family.  Other vital signs were stable/normal.  An O2 sat was 100% on room air.  Urinalysis obtained today was completely within normal limits.  Urine culture results.  Patient also obtained blood cultures 2 with results pending.  Patient also had a flu nasal swab obtained and is results are pending as well.  Advised patient that her symptoms were most likely related to a viral illness; since there was such a sudden acute onset of her symptoms.  Advised patient would follow her flu swab results as soon as they are available.  In the meantime-patient was advised she may take Tylenol as directed for fever.  She was also advised to call/return or go directly to the emergency department for any worsening symptoms whatsoever.    Cancer of central portion of right female breast Patient is currently taking Neratinib oral therapy on a daily basis as directed.  She presented to the [facility] today with acute onset fever, achiness, and sore throat.  See further notes for details of today's visit.  Patient is scheduled to  return for labs only on 05/01/2016.  She is scheduled for labs, flush, and visit on 05/19/2016.   Patient stated understanding of all instructions; and was in agreement with this plan of care. The patient knows to call the clinic with any problems, questions or concerns.   Total time spent with patient was 25 minutes;  with greater than 75 percent of that time spent in face to face counseling regarding patient's symptoms,  and coordination of care and follow up.  Disclaimer:This dictation was prepared with Dragon/digital dictation along with [employer]. Any transcriptional errors that result from this process are unintentional.  Drue Second, NP 04/08/2016

## 2016-04-08 NOTE — Telephone Encounter (Signed)
Called patient back this morning to check in with her.  Advised patient that her flu swab was negative.  Patient stated that she is feeling much better today and has no fever whatsoever.  Patient was advised to call/return or go directly to the emergency department for worsening symptoms whatsoever.

## 2016-04-08 NOTE — Assessment & Plan Note (Signed)
Patient states that she had been in the [facility] earlier this morning for a regular follow-up visit with Dr. Lindi Adie states that she was feeling a little tired and achy this morning; but had no further symptoms revealed approximately an hour so of leaving Dr. Geralyn Flash office patient  developed acute onset increased fatigue, all over body aches, and fever to maximum 100.5.  She also  complaint is that as well.  She denies any nausea, vomiting, diarrhea, or UTI symptoms.  Exam today reveals lungs bilaterally with no cough or wheeze.  Patient does not appear to have any nasal congestion on exam.  Posterior oropharynx is mildly red; but no exudate.  Patient does appear mildly flushed, and temperature was 100.1 while in the family.  Other vital signs were stable/normal.  An O2 sat was 100% on room air.  Urinalysis obtained today was completely within normal limits.  Urine culture results.  Patient also obtained blood cultures 2 with results pending.  Patient also had a flu nasal swab obtained and is results are pending as well.  Advised patient that her symptoms were most likely related to a viral illness; since there was such a sudden acute onset of her symptoms.  Advised patient would follow her flu swab results as soon as they are available.  In the meantime-patient was advised she may take Tylenol as directed for fever.  She was also advised to call/return or go directly to the emergency department for any worsening symptoms whatsoever.

## 2016-04-13 LAB — CULTURE, BLOOD (SINGLE)

## 2016-04-17 LAB — CULTURE, BLOOD (SINGLE)

## 2016-04-30 ENCOUNTER — Telehealth: Payer: Self-pay

## 2016-04-30 NOTE — Telephone Encounter (Signed)
Received VM from pt requesting to reschedule lab appt tom. Pt wishes to have labs drawn during flush appt on 10/31 when she comes back to see Dr. Lindi Adie.  Reviewed this with Dr. Lindi Adie who states that will be fine.  Called pt back to discuss and she verbalized understanding.  Lab appt for tomorrow cancelled and pt will come in 10/31.  Pt confirmed she will contact us should the need arise before 10/31.

## 2016-05-01 ENCOUNTER — Other Ambulatory Visit: Payer: Managed Care, Other (non HMO)

## 2016-05-05 ENCOUNTER — Other Ambulatory Visit: Payer: Self-pay | Admitting: General Surgery

## 2016-05-06 ENCOUNTER — Telehealth: Payer: Self-pay | Admitting: Pharmacist

## 2016-05-06 NOTE — Telephone Encounter (Signed)
Oral Chemotherapy Follow-Up Form  Called patient today to follow up regarding patient's oral chemotherapy medication: Neratinib. Pt is doing well today. She reports having 1-2 episodes of diarrhea since stopping her prophylactic loperamide but when she takes her loperamide prn the diarrhea resolves. She reports a little fatigue but is not sure if this is from the medications or just general fatigue. Other than the occasional diarrhea and slight fatigue she reports she is doing well. Instructed the patient to call if she has any issues or questions about her oral medication.   Patient has OV scheduled for the end of this month, will follow along to monitor the patients status.   Thank you,  Nuala Alpha, PharmD Oral Chemotherapy Clinic [phone]

## 2016-05-15 ENCOUNTER — Encounter: Payer: Self-pay | Admitting: Pharmacist

## 2016-05-15 NOTE — Progress Notes (Signed)
Oral Chemotherapy Pharmacist Encounter  Received notification from patient's insurance company that Nerlynx would require prior authorization despite the fact that the patient has been on medication for 2 months. PA initiated by calling [phone] PA approved, case# [id number] PA dates: 04/15/16-03/11/17  Please let us know how else we can help.  Oral Chemo Clinic will continue to follow.  Johny Drilling, PharmD, BCPS 05/15/2016  12:43 PM Oral Chemotherapy Clinic [phone]

## 2016-05-15 NOTE — Progress Notes (Signed)
Received authorization regarding Nerlynx.  Authorization effective from 04/15/16 to 03/11/2017.  Case ID [id number].  Document sent to scan.

## 2016-05-19 ENCOUNTER — Ambulatory Visit (HOSPITAL_BASED_OUTPATIENT_CLINIC_OR_DEPARTMENT_OTHER): Payer: Managed Care, Other (non HMO) | Admitting: Hematology and Oncology

## 2016-05-19 ENCOUNTER — Other Ambulatory Visit (HOSPITAL_BASED_OUTPATIENT_CLINIC_OR_DEPARTMENT_OTHER): Payer: Managed Care, Other (non HMO)

## 2016-05-19 ENCOUNTER — Encounter: Payer: Self-pay | Admitting: Hematology and Oncology

## 2016-05-19 ENCOUNTER — Ambulatory Visit (HOSPITAL_BASED_OUTPATIENT_CLINIC_OR_DEPARTMENT_OTHER): Payer: Managed Care, Other (non HMO)

## 2016-05-19 DIAGNOSIS — R197 Diarrhea, unspecified: Secondary | ICD-10-CM

## 2016-05-19 DIAGNOSIS — Z1502 Genetic susceptibility to malignant neoplasm of ovary: Secondary | ICD-10-CM

## 2016-05-19 DIAGNOSIS — C50919 Malignant neoplasm of unspecified site of unspecified female breast: Secondary | ICD-10-CM

## 2016-05-19 DIAGNOSIS — Z171 Estrogen receptor negative status [ER-]: Secondary | ICD-10-CM | POA: Diagnosis not present

## 2016-05-19 DIAGNOSIS — C50111 Malignant neoplasm of central portion of right female breast: Secondary | ICD-10-CM

## 2016-05-19 DIAGNOSIS — Z95828 Presence of other vascular implants and grafts: Secondary | ICD-10-CM

## 2016-05-19 DIAGNOSIS — Z1589 Genetic susceptibility to other disease: Secondary | ICD-10-CM

## 2016-05-19 DIAGNOSIS — Z1509 Genetic susceptibility to other malignant neoplasm: Secondary | ICD-10-CM

## 2016-05-19 LAB — COMPREHENSIVE METABOLIC PANEL
ALT: 11 U/L (ref 0–55)
ANION GAP: 10 meq/L (ref 3–11)
AST: 15 U/L (ref 5–34)
Albumin: 4 g/dL (ref 3.5–5.0)
Alkaline Phosphatase: 69 U/L (ref 40–150)
BUN: 18.8 mg/dL (ref 7.0–26.0)
CALCIUM: 9.8 mg/dL (ref 8.4–10.4)
CHLORIDE: 108 meq/L (ref 98–109)
CO2: 24 meq/L (ref 22–29)
CREATININE: 1 mg/dL (ref 0.6–1.1)
EGFR: 73 mL/min/{1.73_m2} — AB (ref >90)
Glucose: 111 mg/dl (ref 70–140)
POTASSIUM: 4 meq/L (ref 3.5–5.1)
Sodium: 142 mEq/L (ref 136–145)
Total Bilirubin: 0.83 mg/dL (ref 0.20–1.20)
Total Protein: 6.6 g/dL (ref 6.4–8.3)

## 2016-05-19 LAB — CBC WITH DIFFERENTIAL/PLATELET
BASO%: 0.7 % (ref 0.0–2.0)
BASOS ABS: 0 10*3/uL (ref 0.0–0.1)
EOS ABS: 0.2 10*3/uL (ref 0.0–0.5)
EOS%: 3 % (ref 0.0–7.0)
HEMATOCRIT: 37.9 % (ref 34.8–46.6)
HGB: 12.6 g/dL (ref 11.6–15.9)
LYMPH#: 2.1 10*3/uL (ref 0.9–3.3)
LYMPH%: 32.4 % (ref 14.0–49.7)
MCH: 28.5 pg (ref 25.1–34.0)
MCHC: 33.2 g/dL (ref 31.5–36.0)
MCV: 86.1 fL (ref 79.5–101.0)
MONO#: 0.3 10*3/uL (ref 0.1–0.9)
MONO%: 5.1 % (ref 0.0–14.0)
NEUT#: 3.8 10*3/uL (ref 1.5–6.5)
NEUT%: 58.8 % (ref 38.4–76.8)
PLATELETS: 230 10*3/uL (ref 145–400)
RBC: 4.4 10*6/uL (ref 3.70–5.45)
RDW: 13.6 % (ref 11.2–14.5)
WBC: 6.5 10*3/uL (ref 3.9–10.3)

## 2016-05-19 MED ORDER — SODIUM CHLORIDE 0.9 % IJ SOLN
10.0000 mL | INTRAMUSCULAR | Status: DC | PRN
  Administered 2016-05-19: 10 mL via INTRAVENOUS
  Filled 2016-05-19: qty 10

## 2016-05-19 MED ORDER — HEPARIN SOD (PORK) LOCK FLUSH 100 UNIT/ML IV SOLN
500.0000 [IU] | Freq: Once | INTRAVENOUS | Status: AC | PRN
  Administered 2016-05-19: 500 [IU] via INTRAVENOUS
  Filled 2016-05-19: qty 5

## 2016-05-19 NOTE — Progress Notes (Signed)
Patient Care Team: Lanice Shirts, MD as PCP - General (Internal Medicine) Nicholas Lose, MD as Consulting Physician (Hematology and Oncology) Excell Seltzer, MD as Consulting Physician (General Surgery) Crissie Reese, MD as Consulting Physician (Plastic Surgery) Gery Pray, MD as Consulting Physician (Radiation Oncology)  DIAGNOSIS:  Encounter Diagnosis  Name Primary?  . Malignant neoplasm of central portion of right breast in female, estrogen receptor negative ([place])     SUMMARY OF ONCOLOGIC HISTORY:   Cancer of central portion of right female breast ([place])   11/08/2014 Mammogram    Segmental group of heterogeneous calcifications. Calcifications span 2.8 cm anterior posterior x 1.9 cm transverse x 0.6 cm craniocaudal      11/14/2014 Initial Diagnosis    Right breast biopsy 8:30 position: Invasive ductal carcinoma with DCIS, grade 2, ER 0%, PR 0%, HER-2 positive ratio 3.53      11/20/2014 Breast MRI    Right breast: 3.7 x 2.8 x 1.7 cm area of non-mass enhancement and retroareolar right breast into the lower outer quadrant and lower inner quadrant extending into the nipple involving anterior two thirds of the breast, no lymph nodes      11/20/2014 Clinical Stage    Stage IIA: T2 N0      12/04/2014 - 11/27/2015 Neo-Adjuvant Chemotherapy    Taxotere, carboplatin, Herceptin, Perjeta 6 cycles followed by Herceptin maintenance for 1 year      12/19/2014 Procedure    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      04/17/2015 Surgery    Right Mastectomy: Microscopic focus of DCIS 0.1 cm 0/2 LN Neg. No invasive cancer. Path CR;  Left Mastectomy: Neg      04/17/2015 Pathologic Stage    ypTis ypN0      07/04/2015 Surgery    Bilateral salpingo-oophorectomy      01/28/2016 Survivorship    SCP visit completed      03/31/2016 Miscellaneous    Neratinib Daily       CHIEF COMPLIANT: Follow-up on Neratinib  INTERVAL HISTORY: Robin Miles is  a 40 year old with above-mentioned history of right breast cancer treated with neoadjuvant chemotherapy and had a complete response and underwent mastectomy. She is currently on oral therapy with Neratinib after completing one year of Herceptin maintenance. She had mild diarrhea from Neratinib but otherwise doing quite well. She denied any nausea vomiting. She is complaining of flulike symptoms almost every day between 2 PM to 6 PM.   REVIEW OF SYSTEMS:   Constitutional: Denies fevers, chills or abnormal weight loss Eyes: Denies blurriness of vision Ears, nose, mouth, throat, and face: Denies mucositis or sore throat Respiratory: Denies cough, dyspnea or wheezes Cardiovascular: Denies palpitation, chest discomfort Gastrointestinal:  Denies nausea, heartburn or change in bowel habits Skin: Denies abnormal skin rashes Lymphatics: Denies new lymphadenopathy or easy bruising Neurological:Denies numbness, tingling or new weaknesses Behavioral/Psych: Mood is stable, no new changes  Extremities: No lower extremity edema  All other systems were reviewed with the patient and are negative.  I have reviewed the past medical history, past surgical history, social history and family history with the patient and they are unchanged from previous note.  ALLERGIES:  is allergic to compazine [prochlorperazine].  MEDICATIONS:  Current Outpatient Prescriptions  Medication Sig Dispense Refill  . LORazepam (ATIVAN) 0.5 MG tablet TAKE 1 TABLET EVERY 6 HOURS AS NEEDED FOR ANXIETY 30 tablet 0  . Neratinib Maleate 40 MG TABS Take 240 mg by mouth daily. 180 tablet 6  .  Probiotic Product (PROBIOTIC PO) Take 1 capsule by mouth daily.     No current facility-administered medications for this visit.     PHYSICAL EXAMINATION: ECOG PERFORMANCE STATUS: 1 - Symptomatic but completely ambulatory  Vitals:   05/19/16 1201  BP: 125/60  Pulse: 64  Resp: 18  Temp: 98.1 F (36.7 C)   Filed Weights   05/19/16 1201    Weight: 125 lb 4.8 oz (56.8 kg)    GENERAL:alert, no distress and comfortable SKIN: skin color, texture, turgor are normal, no rashes or significant lesions EYES: normal, Conjunctiva are pink and non-injected, sclera clear OROPHARYNX:no exudate, no erythema and lips, buccal mucosa, and tongue normal  NECK: supple, thyroid normal size, non-tender, without nodularity LYMPH:  no palpable lymphadenopathy in the cervical, axillary or inguinal LUNGS: clear to auscultation and percussion with normal breathing effort HEART: regular rate & rhythm and no murmurs and no lower extremity edema ABDOMEN:abdomen soft, non-tender and normal bowel sounds MUSCULOSKELETAL:no cyanosis of digits and no clubbing  NEURO: alert & oriented x 3 with fluent speech, no focal motor/sensory deficits EXTREMITIES: No lower extremity edema  LABORATORY DATA:  I have reviewed the data as listed   Chemistry      Component Value Date/Time   NA 142 05/19/2016 1140   K 4.0 05/19/2016 1140   CL 105 06/27/2015 1040   CO2 24 05/19/2016 1140   BUN 18.8 05/19/2016 1140   CREATININE 1.0 05/19/2016 1140      Component Value Date/Time   CALCIUM 9.8 05/19/2016 1140   ALKPHOS 69 05/19/2016 1140   AST 15 05/19/2016 1140   ALT 11 05/19/2016 1140   BILITOT 0.83 05/19/2016 1140       Lab Results  Component Value Date   WBC 6.5 05/19/2016   HGB 12.6 05/19/2016   HCT 37.9 05/19/2016   MCV 86.1 05/19/2016   PLT 230 05/19/2016   NEUTROABS 3.8 05/19/2016     ASSESSMENT & PLAN:  Cancer of central portion of right female breast Right breast retroareolar mass 3.7 x 2.8 x 1.7 cm involving anterior two thirds of the breast, T2 N0 M0 stage II a clinical stage, ER 0%, appears 0%, HER-2 positive ratio 3.53, PALB 2 mutation,   Treatment summary:  1. Completed neo-adjuvant chemotherapy with TCH Perjeta started 12/04/2014 and completed 03/20/2015  2. Right Mastectomy 04/17/15: Microscopic focus of DCIS 0.1 cm 0/2 LN Neg. No inv  cancer. Path CR; Left Mastectomy: Neg  3. Completed Herceptin maintenance 11/27/2015 ----------------------------------------------------------------------------------------------------------------------------------------------------------------- Arthritisin her knees and hips  Severe hot flashessince she got oophorectomy: She could not tolerate neurontin. Echocardiogram April 2017: EF 55-60%   Current treatment: Neratinib started 03/31/2016 Neratinib Toxicities: 1. Decreased appetite 2. diarrhea grade 1 3. One episode of fever 4. Flu-like illness every day: I instructed her to cut down the dosage of Neratinib to 200 mg daily.the other possible etiology for this could be hormonal changes.  Return to clinic in 2 months for lab count check and follow-up.     Orders Placed This Encounter  Procedures  . US BREAST LTD UNI RIGHT INC AXILLA    Standing Status:   Future    Standing Expiration Date:   07/19/2017    Order Specific Question:   Reason for Exam (SYMPTOM  OR DIAGNOSIS REQUIRED)    Answer:   Right breast lump    Order Specific Question:   Preferred imaging location?    Answer:   [facility]  . TSH    Standing Status:  Future    Standing Expiration Date:   05/19/2017  . CBC with Differential    Standing Status:   Future    Standing Expiration Date:   05/19/2017  . Comprehensive metabolic panel    Standing Status:   Future    Standing Expiration Date:   05/19/2017   The patient has a good understanding of the overall plan. she agrees with it. she will call with any problems that may develop before the next visit here.   Rulon Eisenmenger, MD 05/19/16

## 2016-05-19 NOTE — Assessment & Plan Note (Signed)
Right breast retroareolar mass 3.7 x 2.8 x 1.7 cm involving anterior two thirds of the breast, T2 N0 M0 stage II a clinical stage, ER 0%, appears 0%, HER-2 positive ratio 3.53, PALB 2 mutation,   Treatment summary:  1. Completed neo-adjuvant chemotherapy with TCH Perjeta started 12/04/2014 and completed 03/20/2015  2. Right Mastectomy 04/17/15: Microscopic focus of DCIS 0.1 cm 0/2 LN Neg. No inv cancer. Path CR; Left Mastectomy: Neg  3. Completed Herceptin maintenance 11/27/2015 ----------------------------------------------------------------------------------------------------------------------------------------------------------------- Arthritisin her knees and hips  Severe hot flashessince she got oophorectomy: She could not tolerate neurontin. Echocardiogram April 2017: EF 55-60%   Current treatment: Neratinib started 03/31/2016 Neratinib Toxicities: 1. Decreased appetite 2. diarrhea grade 1 3. One episode of fever  Return to clinic in 6 weeks for lab count check and follow-up.

## 2016-05-22 ENCOUNTER — Encounter (HOSPITAL_COMMUNITY): Payer: Self-pay | Admitting: Vascular Surgery

## 2016-05-28 ENCOUNTER — Telehealth: Payer: Self-pay | Admitting: Hematology and Oncology

## 2016-05-28 NOTE — Telephone Encounter (Signed)
Patient called to cancel 11/10 appointment.

## 2016-05-29 ENCOUNTER — Other Ambulatory Visit: Payer: Managed Care, Other (non HMO)

## 2016-06-05 ENCOUNTER — Other Ambulatory Visit: Payer: Self-pay

## 2016-06-05 DIAGNOSIS — N6310 Unspecified lump in the right breast, unspecified quadrant: Secondary | ICD-10-CM

## 2016-06-06 ENCOUNTER — Other Ambulatory Visit: Payer: Self-pay | Admitting: Hematology and Oncology

## 2016-06-06 DIAGNOSIS — C50111 Malignant neoplasm of central portion of right female breast: Secondary | ICD-10-CM

## 2016-06-09 ENCOUNTER — Other Ambulatory Visit: Payer: Self-pay

## 2016-06-22 ENCOUNTER — Ambulatory Visit
Admission: RE | Admit: 2016-06-22 | Discharge: 2016-06-22 | Disposition: A | Discharge status: Home / Self Care | Payer: Managed Care, Other (non HMO) | Source: Ambulatory Visit | Attending: Hematology and Oncology | Admitting: Hematology and Oncology

## 2016-06-22 ENCOUNTER — Other Ambulatory Visit: Payer: Self-pay | Admitting: Hematology and Oncology

## 2016-06-22 DIAGNOSIS — N6310 Unspecified lump in the right breast, unspecified quadrant: Secondary | ICD-10-CM

## 2016-06-22 DIAGNOSIS — R2231 Localized swelling, mass and lump, right upper limb: Secondary | ICD-10-CM

## 2016-06-26 ENCOUNTER — Other Ambulatory Visit: Payer: Managed Care, Other (non HMO)

## 2016-07-07 ENCOUNTER — Ambulatory Visit (HOSPITAL_BASED_OUTPATIENT_CLINIC_OR_DEPARTMENT_OTHER): Payer: Managed Care, Other (non HMO) | Admitting: Hematology and Oncology

## 2016-07-07 ENCOUNTER — Other Ambulatory Visit (HOSPITAL_BASED_OUTPATIENT_CLINIC_OR_DEPARTMENT_OTHER): Payer: Managed Care, Other (non HMO)

## 2016-07-07 ENCOUNTER — Encounter: Payer: Self-pay | Admitting: Hematology and Oncology

## 2016-07-07 ENCOUNTER — Ambulatory Visit (HOSPITAL_BASED_OUTPATIENT_CLINIC_OR_DEPARTMENT_OTHER): Payer: Managed Care, Other (non HMO)

## 2016-07-07 DIAGNOSIS — Z79899 Other long term (current) drug therapy: Secondary | ICD-10-CM

## 2016-07-07 DIAGNOSIS — R5383 Other fatigue: Secondary | ICD-10-CM

## 2016-07-07 DIAGNOSIS — Z171 Estrogen receptor negative status [ER-]: Secondary | ICD-10-CM

## 2016-07-07 DIAGNOSIS — C50111 Malignant neoplasm of central portion of right female breast: Secondary | ICD-10-CM | POA: Diagnosis not present

## 2016-07-07 DIAGNOSIS — Z1589 Genetic susceptibility to other disease: Secondary | ICD-10-CM

## 2016-07-07 DIAGNOSIS — Z95828 Presence of other vascular implants and grafts: Secondary | ICD-10-CM

## 2016-07-07 DIAGNOSIS — Z1502 Genetic susceptibility to malignant neoplasm of ovary: Secondary | ICD-10-CM

## 2016-07-07 DIAGNOSIS — C50919 Malignant neoplasm of unspecified site of unspecified female breast: Secondary | ICD-10-CM

## 2016-07-07 DIAGNOSIS — Z1509 Genetic susceptibility to other malignant neoplasm: Secondary | ICD-10-CM

## 2016-07-07 LAB — CBC WITH DIFFERENTIAL/PLATELET
BASO%: 0.4 % (ref 0.0–2.0)
BASOS ABS: 0 10*3/uL (ref 0.0–0.1)
EOS ABS: 0.2 10*3/uL (ref 0.0–0.5)
EOS%: 3.4 % (ref 0.0–7.0)
HCT: 35.8 % (ref 34.8–46.6)
HGB: 12.2 g/dL (ref 11.6–15.9)
LYMPH%: 33.3 % (ref 14.0–49.7)
MCH: 28.8 pg (ref 25.1–34.0)
MCHC: 34.1 g/dL (ref 31.5–36.0)
MCV: 84.6 fL (ref 79.5–101.0)
MONO#: 0.3 10*3/uL (ref 0.1–0.9)
MONO%: 5 % (ref 0.0–14.0)
NEUT%: 57.9 % (ref 38.4–76.8)
NEUTROS ABS: 3.1 10*3/uL (ref 1.5–6.5)
PLATELETS: 215 10*3/uL (ref 145–400)
RBC: 4.23 10*6/uL (ref 3.70–5.45)
RDW: 13 % (ref 11.2–14.5)
WBC: 5.4 10*3/uL (ref 3.9–10.3)
lymph#: 1.8 10*3/uL (ref 0.9–3.3)

## 2016-07-07 LAB — COMPREHENSIVE METABOLIC PANEL
ALT: 13 U/L (ref 0–55)
ANION GAP: 6 meq/L (ref 3–11)
AST: 16 U/L (ref 5–34)
Albumin: 4 g/dL (ref 3.5–5.0)
Alkaline Phosphatase: 69 U/L (ref 40–150)
BUN: 21.6 mg/dL (ref 7.0–26.0)
CHLORIDE: 108 meq/L (ref 98–109)
CO2: 27 meq/L (ref 22–29)
Calcium: 10 mg/dL (ref 8.4–10.4)
Creatinine: 1 mg/dL (ref 0.6–1.1)
EGFR: 72 mL/min/{1.73_m2} — AB (ref >90)
GLUCOSE: 107 mg/dL (ref 70–140)
POTASSIUM: 3.9 meq/L (ref 3.5–5.1)
SODIUM: 140 meq/L (ref 136–145)
Total Bilirubin: 0.61 mg/dL (ref 0.20–1.20)
Total Protein: 6.5 g/dL (ref 6.4–8.3)

## 2016-07-07 LAB — TSH: TSH: 1.972 m(IU)/L (ref 0.308–3.960)

## 2016-07-07 MED ORDER — VENLAFAXINE HCL 37.5 MG PO TABS: 37.5000 mg | ORAL_TABLET | Freq: Two times a day (BID) | ORAL | 6 refills | Status: DC

## 2016-07-07 MED ORDER — HEPARIN SOD (PORK) LOCK FLUSH 100 UNIT/ML IV SOLN
500.0000 [IU] | Freq: Once | INTRAVENOUS | Status: AC | PRN
  Administered 2016-07-07: 500 [IU] via INTRAVENOUS
  Filled 2016-07-07: qty 5

## 2016-07-07 MED ORDER — SODIUM CHLORIDE 0.9 % IJ SOLN
10.0000 mL | INTRAMUSCULAR | Status: DC | PRN
  Administered 2016-07-07: 10 mL via INTRAVENOUS
  Filled 2016-07-07: qty 10

## 2016-07-07 NOTE — Assessment & Plan Note (Signed)
Right breast retroareolar mass 3.7 x 2.8 x 1.7 cm involving anterior two thirds of the breast, T2 N0 M0 stage II a clinical stage, ER 0%, appears 0%, HER-2 positive ratio 3.53, PALB 2 mutation,   Treatment summary:  1. Completed neo-adjuvant chemotherapy with TCH Perjeta started 12/04/2014 and completed 03/20/2015  2. Right Mastectomy 04/17/15: Microscopic focus of DCIS 0.1 cm 0/2 LN Neg. No inv cancer. Path CR; Left Mastectomy: Neg  3. Completed Herceptin maintenance 11/27/2015 ----------------------------------------------------------------------------------------------------------------------------------------------------------------- Arthritisin her knees and hips  Severe hot flashessince she got oophorectomy: She could not tolerate neurontin. Echocardiogram April 2017: EF 55-60%   Current treatment: Neratinib started 03/31/2016 Neratinib Toxicities: 1. Decreased appetite 2. diarrhea grade 1 3. One episode of fever 4. Flu-like illness every day: Decreased the dosage of Neratinib to 200 mg daily.  Return to clinic in 2 months for lab count check and follow-up.

## 2016-07-07 NOTE — Progress Notes (Signed)
Patient Care Team: Lanice Shirts, MD as PCP - General (Internal Medicine) Nicholas Lose, MD as Consulting Physician (Hematology and Oncology) Excell Seltzer, MD as Consulting Physician (General Surgery) Crissie Reese, MD as Consulting Physician (Plastic Surgery) Gery Pray, MD as Consulting Physician (Radiation Oncology)  DIAGNOSIS:  Encounter Diagnosis  Name Primary?  . Malignant neoplasm of central portion of right breast in female, estrogen receptor negative ([place])     SUMMARY OF ONCOLOGIC HISTORY:   Cancer of central portion of right female breast ([place])   11/08/2014 Mammogram    Segmental group of heterogeneous calcifications. Calcifications span 2.8 cm anterior posterior x 1.9 cm transverse x 0.6 cm craniocaudal      11/14/2014 Initial Diagnosis    Right breast biopsy 8:30 position: Invasive ductal carcinoma with DCIS, grade 2, ER 0%, PR 0%, HER-2 positive ratio 3.53      11/20/2014 Breast MRI    Right breast: 3.7 x 2.8 x 1.7 cm area of non-mass enhancement and retroareolar right breast into the lower outer quadrant and lower inner quadrant extending into the nipple involving anterior two thirds of the breast, no lymph nodes      11/20/2014 Clinical Stage    Stage IIA: T2 N0      12/04/2014 - 11/27/2015 Neo-Adjuvant Chemotherapy    Taxotere, carboplatin, Herceptin, Perjeta 6 cycles followed by Herceptin maintenance for 1 year      12/19/2014 Procedure    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      04/17/2015 Surgery    Right Mastectomy: Microscopic focus of DCIS 0.1 cm 0/2 LN Neg. No invasive cancer. Path CR;  Left Mastectomy: Neg      04/17/2015 Pathologic Stage    ypTis ypN0      07/04/2015 Surgery    Bilateral salpingo-oophorectomy      01/28/2016 Survivorship    SCP visit completed      03/31/2016 Miscellaneous    Neratinib Daily       CHIEF COMPLIANT: Follow-up on Neratinib  INTERVAL HISTORY: Robin Miles is  a 40 year old with above-mentioned history of right breast cancer treated with neoadjuvant chemotherapy and had a complete response and underwent mastectomy. She is currently on oral therapy with Neratinib after completing one year of Herceptin maintenance. She had mild diarrhea from Neratinib but otherwise doing quite well. She denied any nausea vomiting. She was complaining of flulike symptoms and we did use the dosage of Neratinib. She then started experiencing mouth sores and she reduced the dosage to 4 tablets daily. Mouth sores have disappeared. Her muscle aches and pains and fatigue have improved slightly.  REVIEW OF SYSTEMS:   Constitutional: Denies fevers, chills or abnormal weight loss Eyes: Denies blurriness of vision Ears, nose, mouth, throat, and face: Mouth sores which have resolved Respiratory: Denies cough, dyspnea or wheezes Cardiovascular: Denies palpitation, chest discomfort Gastrointestinal:  Denies nausea, heartburn or change in bowel habits Skin: Denies abnormal skin rashes Lymphatics: Denies new lymphadenopathy or easy bruising Neurological:Denies numbness, tingling or new weaknesses Behavioral/Psych: Mood is stable, no new changes  Extremities: No lower extremity edema  All other systems were reviewed with the patient and are negative.  I have reviewed the past medical history, past surgical history, social history and family history with the patient and they are unchanged from previous note.  ALLERGIES:  is allergic to compazine [prochlorperazine].  MEDICATIONS:  Current Outpatient Prescriptions  Medication Sig Dispense Refill  . LORazepam (ATIVAN) 0.5 MG tablet TAKE 1 TABLET  EVERY 6 HOURS AS NEEDED 30 tablet 0  . Neratinib Maleate 40 MG TABS Take 240 mg by mouth daily. 180 tablet 6  . Probiotic Product (PROBIOTIC PO) Take 1 capsule by mouth daily.    Marland Kitchen venlafaxine (EFFEXOR) 37.5 MG tablet Take 1 tablet (37.5 mg total) by mouth 2 (two) times daily. 30 tablet 6    No current facility-administered medications for this visit.     PHYSICAL EXAMINATION: ECOG PERFORMANCE STATUS: 1 - Symptomatic but completely ambulatory  Vitals:   07/07/16 1100  BP: 119/74  Pulse: (!) 59  Resp: 18  Temp: 97.8 F (36.6 C)   Filed Weights   07/07/16 1100  Weight: 127 lb 12.8 oz (58 kg)    GENERAL:alert, no distress and comfortable SKIN: skin color, texture, turgor are normal, no rashes or significant lesions EYES: normal, Conjunctiva are pink and non-injected, sclera clear OROPHARYNX:no exudate, no erythema and lips, buccal mucosa, and tongue normal  NECK: supple, thyroid normal size, non-tender, without nodularity LYMPH:  no palpable lymphadenopathy in the cervical, axillary or inguinal LUNGS: clear to auscultation and percussion with normal breathing effort HEART: regular rate & rhythm and no murmurs and no lower extremity edema ABDOMEN:abdomen soft, non-tender and normal bowel sounds MUSCULOSKELETAL:no cyanosis of digits and no clubbing  NEURO: alert & oriented x 3 with fluent speech, no focal motor/sensory deficits EXTREMITIES: No lower extremity edema  LABORATORY DATA:  I have reviewed the data as listed   Chemistry      Component Value Date/Time   NA 140 07/07/2016 1026   K 3.9 07/07/2016 1026   CL 105 06/27/2015 1040   CO2 27 07/07/2016 1026   BUN 21.6 07/07/2016 1026   CREATININE 1.0 07/07/2016 1026      Component Value Date/Time   CALCIUM 10.0 07/07/2016 1026   ALKPHOS 69 07/07/2016 1026   AST 16 07/07/2016 1026   ALT 13 07/07/2016 1026   BILITOT 0.61 07/07/2016 1026       Lab Results  Component Value Date   WBC 5.4 07/07/2016   HGB 12.2 07/07/2016   HCT 35.8 07/07/2016   MCV 84.6 07/07/2016   PLT 215 07/07/2016   NEUTROABS 3.1 07/07/2016    ASSESSMENT & PLAN:  Cancer of central portion of right female breast Right breast retroareolar mass 3.7 x 2.8 x 1.7 cm involving anterior two thirds of the breast, T2 N0 M0 stage II a  clinical stage, ER 0%, appears 0%, HER-2 positive ratio 3.53, PALB 2 mutation,   Treatment summary:  1. Completed neo-adjuvant chemotherapy with TCH Perjeta started 12/04/2014 and completed 03/20/2015  2. Right Mastectomy 04/17/15: Microscopic focus of DCIS 0.1 cm 0/2 LN Neg. No inv cancer. Path CR; Left Mastectomy: Neg  3. Completed Herceptin maintenance 11/27/2015 ----------------------------------------------------------------------------------------------------------------------------------------------------------------- Arthritisin her knees and hips  Severe hot flashessince she got oophorectomy: She could not tolerate neurontin. Echocardiogram April 2017: EF 55-60%   Current treatment: Neratinib started 03/31/2016 Neratinib Toxicities: 1. Decreased appetite 2. diarrhea grade 1 3. One episode of fever 4. Flu-like illness every day: Decreased the dosage of Neratinib 5. Mouth sores: Decreased the dosage to160 mg 6. Fatigue: Due to Neratinib. I'm checking a TSH level today.  Patient expressed concern for brain metastases. I tried to reassure her that she does not have any symptoms and hence did not recommend doing any scans. Return to clinic in 3 months for lab count check and follow-up.    Orders Placed This Encounter  Procedures  . CBC with Differential  Standing Status:   Future    Standing Expiration Date:   07/07/2017  . Comprehensive metabolic panel    Standing Status:   Future    Standing Expiration Date:   07/07/2017   The patient has a good understanding of the overall plan. she agrees with it. she will call with any problems that may develop before the next visit here.   Rulon Eisenmenger, MD 07/07/16

## 2016-07-22 ENCOUNTER — Telehealth (HOSPITAL_COMMUNITY): Payer: Self-pay | Admitting: Vascular Surgery

## 2016-07-22 NOTE — Telephone Encounter (Signed)
Left pt message to make f/u appt w/ ECHO 

## 2016-08-28 ENCOUNTER — Ambulatory Visit: Payer: Managed Care, Other (non HMO) | Admitting: Hematology and Oncology

## 2016-09-18 ENCOUNTER — Other Ambulatory Visit: Payer: Self-pay | Admitting: Hematology and Oncology

## 2016-09-18 DIAGNOSIS — C50111 Malignant neoplasm of central portion of right female breast: Secondary | ICD-10-CM

## 2016-10-06 ENCOUNTER — Encounter: Payer: Self-pay | Admitting: Hematology and Oncology

## 2016-10-06 ENCOUNTER — Ambulatory Visit (HOSPITAL_BASED_OUTPATIENT_CLINIC_OR_DEPARTMENT_OTHER): Payer: Managed Care, Other (non HMO) | Admitting: Hematology and Oncology

## 2016-10-06 ENCOUNTER — Other Ambulatory Visit (HOSPITAL_BASED_OUTPATIENT_CLINIC_OR_DEPARTMENT_OTHER): Payer: Managed Care, Other (non HMO)

## 2016-10-06 ENCOUNTER — Ambulatory Visit (HOSPITAL_BASED_OUTPATIENT_CLINIC_OR_DEPARTMENT_OTHER): Payer: Managed Care, Other (non HMO)

## 2016-10-06 DIAGNOSIS — Z171 Estrogen receptor negative status [ER-]: Secondary | ICD-10-CM | POA: Diagnosis not present

## 2016-10-06 DIAGNOSIS — C50111 Malignant neoplasm of central portion of right female breast: Secondary | ICD-10-CM | POA: Diagnosis not present

## 2016-10-06 DIAGNOSIS — Z1509 Genetic susceptibility to other malignant neoplasm: Secondary | ICD-10-CM

## 2016-10-06 DIAGNOSIS — Z1589 Genetic susceptibility to other disease: Secondary | ICD-10-CM

## 2016-10-06 DIAGNOSIS — Z95828 Presence of other vascular implants and grafts: Secondary | ICD-10-CM

## 2016-10-06 DIAGNOSIS — Z1502 Genetic susceptibility to malignant neoplasm of ovary: Secondary | ICD-10-CM

## 2016-10-06 DIAGNOSIS — C50919 Malignant neoplasm of unspecified site of unspecified female breast: Secondary | ICD-10-CM

## 2016-10-06 LAB — CBC WITH DIFFERENTIAL/PLATELET
BASO%: 0.9 % (ref 0.0–2.0)
BASOS ABS: 0 10*3/uL (ref 0.0–0.1)
EOS ABS: 0.2 10*3/uL (ref 0.0–0.5)
EOS%: 4.1 % (ref 0.0–7.0)
HCT: 38.3 % (ref 34.8–46.6)
HGB: 13.1 g/dL (ref 11.6–15.9)
LYMPH%: 33.3 % (ref 14.0–49.7)
MCH: 28.6 pg (ref 25.1–34.0)
MCHC: 34.2 g/dL (ref 31.5–36.0)
MCV: 83.6 fL (ref 79.5–101.0)
MONO#: 0.4 10*3/uL (ref 0.1–0.9)
MONO%: 7.4 % (ref 0.0–14.0)
NEUT#: 2.7 10*3/uL (ref 1.5–6.5)
NEUT%: 54.3 % (ref 38.4–76.8)
Platelets: 217 10*3/uL (ref 145–400)
RBC: 4.58 10*6/uL (ref 3.70–5.45)
RDW: 13.6 % (ref 11.2–14.5)
WBC: 5 10*3/uL (ref 3.9–10.3)
lymph#: 1.7 10*3/uL (ref 0.9–3.3)

## 2016-10-06 LAB — COMPREHENSIVE METABOLIC PANEL
ALK PHOS: 65 U/L (ref 40–150)
ALT: 17 U/L (ref 0–55)
AST: 18 U/L (ref 5–34)
Albumin: 4.3 g/dL (ref 3.5–5.0)
Anion Gap: 9 mEq/L (ref 3–11)
BUN: 17.9 mg/dL (ref 7.0–26.0)
CHLORIDE: 107 meq/L (ref 98–109)
CO2: 24 mEq/L (ref 22–29)
Calcium: 9.7 mg/dL (ref 8.4–10.4)
Creatinine: 0.9 mg/dL (ref 0.6–1.1)
EGFR: 79 mL/min/{1.73_m2} — AB (ref >90)
Glucose: 95 mg/dl (ref 70–140)
POTASSIUM: 4.3 meq/L (ref 3.5–5.1)
Sodium: 141 mEq/L (ref 136–145)
Total Bilirubin: 0.67 mg/dL (ref 0.20–1.20)
Total Protein: 6.4 g/dL (ref 6.4–8.3)

## 2016-10-06 MED ORDER — ESCITALOPRAM OXALATE 5 MG PO TABS: 5.0000 mg | ORAL_TABLET | Freq: Every day | ORAL | Status: DC

## 2016-10-06 MED ORDER — ESCITALOPRAM OXALATE 10 MG PO TABS: 5.0000 mg | ORAL_TABLET | Freq: Every day | ORAL | Status: DC

## 2016-10-06 MED ORDER — HEPARIN SOD (PORK) LOCK FLUSH 100 UNIT/ML IV SOLN
500.0000 [IU] | Freq: Once | INTRAVENOUS | Status: AC | PRN
  Administered 2016-10-06: 500 [IU] via INTRAVENOUS
  Filled 2016-10-06: qty 5

## 2016-10-06 MED ORDER — SODIUM CHLORIDE 0.9 % IJ SOLN
10.0000 mL | INTRAMUSCULAR | Status: DC | PRN
  Administered 2016-10-06: 10 mL via INTRAVENOUS
  Filled 2016-10-06: qty 10

## 2016-10-06 NOTE — Assessment & Plan Note (Signed)
Right breast retroareolar mass 3.7 x 2.8 x 1.7 cm involving anterior two thirds of the breast, T2 N0 M0 stage II a clinical stage, ER 0%, appears 0%, HER-2 positive ratio 3.53, PALB 2 mutation,   Treatment summary:  1. Completed neo-adjuvant chemotherapy with TCH Perjeta started 12/04/2014 and completed 03/20/2015  2. Right Mastectomy 04/17/15: Microscopic focus of DCIS 0.1 cm 0/2 LN Neg. No inv cancer. Path CR; Left Mastectomy: Neg  3. Completed Herceptin maintenance 11/27/2015 ----------------------------------------------------------------------------------------------------------------------------------------------------------------- Arthritisin her knees and hips  Severe hot flashessince she got oophorectomy: She could not tolerate neurontin. Echocardiogram April 2017: EF 55-60%   Current treatment: Neratinib started 03/31/2016 Neratinib Toxicities: 1. Decreased appetite 2. diarrhea grade 1 3. Flu-like illness every day: Decreased the dosage of Neratinib 4. Mouth sores: Decreased the dosage to160 mg 5. Fatigue: Due to Neratinib. TSH was normal at 1.7.  Return to clinic in 3 monthsfor lab count check and follow-up.

## 2016-10-06 NOTE — Progress Notes (Signed)
Patient Care Team: Lanice Shirts, MD as PCP - General (Internal Medicine) Nicholas Lose, MD as Consulting Physician (Hematology and Oncology) Excell Seltzer, MD as Consulting Physician (General Surgery) Crissie Reese, MD as Consulting Physician (Plastic Surgery) Gery Pray, MD as Consulting Physician (Radiation Oncology)  DIAGNOSIS:  Encounter Diagnosis  Name Primary?  . Malignant neoplasm of central portion of right breast in female, estrogen receptor negative ([place])     SUMMARY OF ONCOLOGIC HISTORY:   Cancer of central portion of right female breast ([place])   11/08/2014 Mammogram    Segmental group of heterogeneous calcifications. Calcifications span 2.8 cm anterior posterior x 1.9 cm transverse x 0.6 cm craniocaudal      11/14/2014 Initial Diagnosis    Right breast biopsy 8:30 position: Invasive ductal carcinoma with DCIS, grade 2, ER 0%, PR 0%, HER-2 positive ratio 3.53      11/20/2014 Breast MRI    Right breast: 3.7 x 2.8 x 1.7 cm area of non-mass enhancement and retroareolar right breast into the lower outer quadrant and lower inner quadrant extending into the nipple involving anterior two thirds of the breast, no lymph nodes      11/20/2014 Clinical Stage    Stage IIA: T2 N0      12/04/2014 - 11/27/2015 Neo-Adjuvant Chemotherapy    Taxotere, carboplatin, Herceptin, Perjeta 6 cycles followed by Herceptin maintenance for 1 year      12/19/2014 Procedure    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      04/17/2015 Surgery    Right Mastectomy: Microscopic focus of DCIS 0.1 cm 0/2 LN Neg. No invasive cancer. Path CR;  Left Mastectomy: Neg      04/17/2015 Pathologic Stage    ypTis ypN0      07/04/2015 Surgery    Bilateral salpingo-oophorectomy      01/28/2016 Survivorship    SCP visit completed      03/31/2016 Miscellaneous    Neratinib Daily       CHIEF COMPLIANT: Follow-up on Neratinib  INTERVAL HISTORY: Robin Miles is  a 41 year old with above-mentioned history of right breast cancer treated with neoadjuvant chemotherapy and had mastectomy with apparent complete response. She is here for follow-up on Neratinib. She is tolerating it fairly well. Diarrhea has resolved. Fatigue is stable.  REVIEW OF SYSTEMS:   Constitutional: Denies fevers, chills or abnormal weight loss Eyes: Denies blurriness of vision Ears, nose, mouth, throat, and face: Denies mucositis or sore throat Respiratory: Denies cough, dyspnea or wheezes Cardiovascular: Denies palpitation, chest discomfort Gastrointestinal:  Denies nausea, heartburn or change in bowel habits Skin: Denies abnormal skin rashes Lymphatics: Denies new lymphadenopathy or easy bruising Neurological:Denies numbness, tingling or new weaknesses Behavioral/Psych: Mood is stable, no new changes  Extremities: No lower extremity edema Complains of bone pain involving her right thigh and left wrist All other systems were reviewed with the patient and are negative.  I have reviewed the past medical history, past surgical history, social history and family history with the patient and they are unchanged from previous note.  ALLERGIES:  is allergic to compazine [prochlorperazine].  MEDICATIONS:  Current Outpatient Prescriptions  Medication Sig Dispense Refill  . LORazepam (ATIVAN) 0.5 MG tablet TAKE 1 TABLET EVERY 6 HOURS AS NEEDED 30 tablet 0  . NERLYNX 40 MG tablet TAKE 6 TABLETS (240 MG) DAILY 180 tablet 6  . Probiotic Product (PROBIOTIC PO) Take 1 capsule by mouth daily.    Marland Kitchen venlafaxine (EFFEXOR) 37.5 MG tablet Take 1  tablet (37.5 mg total) by mouth 2 (two) times daily. 30 tablet 6   No current facility-administered medications for this visit.    Facility-Administered Medications Ordered in Other Visits  Medication Dose Route Frequency Provider Last Rate Last Dose  . sodium chloride 0.9 % injection 10 mL  10 mL Intravenous PRN Nicholas Lose, MD   10 mL at 10/06/16  1026    PHYSICAL EXAMINATION: ECOG PERFORMANCE STATUS: 1 - Symptomatic but completely ambulatory  There were no vitals filed for this visit. There were no vitals filed for this visit.  GENERAL:alert, no distress and comfortable SKIN: skin color, texture, turgor are normal, no rashes or significant lesions EYES: normal, Conjunctiva are pink and non-injected, sclera clear OROPHARYNX:no exudate, no erythema and lips, buccal mucosa, and tongue normal  NECK: supple, thyroid normal size, non-tender, without nodularity LYMPH:  no palpable lymphadenopathy in the cervical, axillary or inguinal LUNGS: clear to auscultation and percussion with normal breathing effort HEART: regular rate & rhythm and no murmurs and no lower extremity edema ABDOMEN:abdomen soft, non-tender and normal bowel sounds MUSCULOSKELETAL:no cyanosis of digits and no clubbing  NEURO: alert & oriented x 3 with fluent speech, no focal motor/sensory deficits EXTREMITIES: No lower extremity edema  LABORATORY DATA:  I have reviewed the data as listed   Chemistry      Component Value Date/Time   NA 140 07/07/2016 1026   K 3.9 07/07/2016 1026   CL 105 06/27/2015 1040   CO2 27 07/07/2016 1026   BUN 21.6 07/07/2016 1026   CREATININE 1.0 07/07/2016 1026      Component Value Date/Time   CALCIUM 10.0 07/07/2016 1026   ALKPHOS 69 07/07/2016 1026   AST 16 07/07/2016 1026   ALT 13 07/07/2016 1026   BILITOT 0.61 07/07/2016 1026       Lab Results  Component Value Date   WBC 5.0 10/06/2016   HGB 13.1 10/06/2016   HCT 38.3 10/06/2016   MCV 83.6 10/06/2016   PLT 217 10/06/2016   NEUTROABS 2.7 10/06/2016    ASSESSMENT & PLAN:  Cancer of central portion of right female breast Right breast retroareolar mass 3.7 x 2.8 x 1.7 cm involving anterior two thirds of the breast, T2 N0 M0 stage II a clinical stage, ER 0%, appears 0%, HER-2 positive ratio 3.53, PALB 2 mutation,   Treatment summary:  1. Completed neo-adjuvant  chemotherapy with TCH Perjeta started 12/04/2014 and completed 03/20/2015  2. Right Mastectomy 04/17/15: Microscopic focus of DCIS 0.1 cm 0/2 LN Neg. No inv cancer. Path CR; Left Mastectomy: Neg  3. Completed Herceptin maintenance 11/27/2015 ----------------------------------------------------------------------------------------------------------------------------------------------------------------- Arthritisin her knees and hips  Severe hot flashessince she got oophorectomy: She could not tolerate neurontin. Echocardiogram April 2017: EF 55-60%   Current treatment: Neratinib started 03/31/2016 Neratinib Toxicities: 1. Decreased appetite 2. diarrhea grade 1 3. Flu-like illness every day: Decreased the dosage of Neratinib 4. Mouth sores: Decreased the dosage to160 mg 5. Fatigue: Due to Neratinib. TSH was normal at 1.7.  Bone pain involving the right thigh and left wrist: Bone scan will be ordered. Patient has been postmenopausal for over 10 years. I would like to obtain a bone density. We will call her with the results of these tests.  Return to clinic in 3 monthsfor lab count check and follow-up.    I spent 25 minutes talking to the patient of which more than half was spent in counseling and coordination of care.  No orders of the defined types were placed in  this encounter.  The patient has a good understanding of the overall plan. she agrees with it. she will call with any problems that may develop before the next visit here.   Rulon Eisenmenger, MD 10/06/16

## 2016-10-27 ENCOUNTER — Ambulatory Visit
Admission: RE | Admit: 2016-10-27 | Discharge: 2016-10-27 | Disposition: A | Discharge status: Home / Self Care | Payer: Managed Care, Other (non HMO) | Source: Ambulatory Visit | Attending: Hematology and Oncology | Admitting: Hematology and Oncology

## 2016-10-27 DIAGNOSIS — Z171 Estrogen receptor negative status [ER-]: Principal | ICD-10-CM

## 2016-10-27 DIAGNOSIS — C50111 Malignant neoplasm of central portion of right female breast: Secondary | ICD-10-CM

## 2016-11-17 ENCOUNTER — Other Ambulatory Visit: Payer: Self-pay | Admitting: Obstetrics and Gynecology

## 2016-11-19 ENCOUNTER — Encounter: Payer: Self-pay | Admitting: Emergency Medicine

## 2016-11-20 LAB — CYTOLOGY - PAP

## 2016-12-03 ENCOUNTER — Other Ambulatory Visit: Payer: Self-pay | Admitting: Obstetrics and Gynecology

## 2016-12-04 LAB — CYTOLOGY - PAP

## 2016-12-08 ENCOUNTER — Other Ambulatory Visit: Payer: Self-pay

## 2016-12-15 ENCOUNTER — Other Ambulatory Visit: Payer: Self-pay | Admitting: Hematology and Oncology

## 2016-12-15 DIAGNOSIS — C50111 Malignant neoplasm of central portion of right female breast: Secondary | ICD-10-CM

## 2016-12-15 DIAGNOSIS — Z170 Estrogen receptor positive status [ER+]: Principal | ICD-10-CM

## 2016-12-15 MED ORDER — NERATINIB MALEATE 40 MG PO TABS: 160.0000 mg | ORAL_TABLET | Freq: Every day | ORAL | 6 refills | Status: DC

## 2016-12-16 ENCOUNTER — Other Ambulatory Visit: Payer: Self-pay

## 2016-12-16 DIAGNOSIS — C50111 Malignant neoplasm of central portion of right female breast: Secondary | ICD-10-CM

## 2016-12-16 DIAGNOSIS — Z170 Estrogen receptor positive status [ER+]: Principal | ICD-10-CM

## 2016-12-16 MED ORDER — NERATINIB MALEATE 40 MG PO TABS: 160.0000 mg | ORAL_TABLET | Freq: Every day | ORAL | 6 refills | Status: DC

## 2016-12-28 NOTE — Assessment & Plan Note (Signed)
Right breast retroareolar mass 3.7 x 2.8 x 1.7 cm involving anterior two thirds of the breast, T2 N0 M0 stage II a clinical stage, ER 0%, appears 0%, HER-2 positive ratio 3.53, PALB 2 mutation,   Treatment summary:  1. Completed neo-adjuvant chemotherapy with TCH Perjeta started 12/04/2014 and completed 03/20/2015  2. Right Mastectomy 04/17/15: Microscopic focus of DCIS 0.1 cm 0/2 LN Neg. No inv cancer. Path CR; Left Mastectomy: Neg  3. Completed Herceptin maintenance 11/27/2015 ----------------------------------------------------------------------------------------------------------------------------------------------------------------- Arthritisin her knees and hips  Severe hot flashessince she got oophorectomy: She could not tolerate neurontin. Echocardiogram April 2017: EF 55-60%   Current treatment: Neratinib started 03/31/2016 Neratinib Toxicities: 1. Decreased appetite 2. diarrhea grade 1 3. Flu-like illness every day: Decreasedthe dosage of Neratinib 4. Mouth sores: Decreased the dosage to160 mg 5. Fatigue: Due to Neratinib. TSH was normal at 1.7.  Bone pain involving the right thigh and left wrist: Bone scan will be ordered. Bone density 10/27/16: T score -0.9 (Normal)  Return to clinic in 62monthfor lab count check and follow-up.

## 2016-12-29 ENCOUNTER — Ambulatory Visit (HOSPITAL_BASED_OUTPATIENT_CLINIC_OR_DEPARTMENT_OTHER): Payer: Managed Care, Other (non HMO) | Admitting: Hematology and Oncology

## 2016-12-29 ENCOUNTER — Ambulatory Visit (HOSPITAL_BASED_OUTPATIENT_CLINIC_OR_DEPARTMENT_OTHER): Payer: Managed Care, Other (non HMO)

## 2016-12-29 ENCOUNTER — Telehealth: Payer: Self-pay

## 2016-12-29 ENCOUNTER — Other Ambulatory Visit (HOSPITAL_BASED_OUTPATIENT_CLINIC_OR_DEPARTMENT_OTHER): Payer: Managed Care, Other (non HMO)

## 2016-12-29 ENCOUNTER — Encounter: Payer: Self-pay | Admitting: Hematology and Oncology

## 2016-12-29 VITALS — BP 105/75 | HR 56 | Temp 98.0°F | Resp 18 | Ht 62.5 in | Wt 123.6 lb

## 2016-12-29 DIAGNOSIS — R42 Dizziness and giddiness: Secondary | ICD-10-CM

## 2016-12-29 DIAGNOSIS — Z171 Estrogen receptor negative status [ER-]: Secondary | ICD-10-CM | POA: Diagnosis not present

## 2016-12-29 DIAGNOSIS — C50111 Malignant neoplasm of central portion of right female breast: Secondary | ICD-10-CM

## 2016-12-29 DIAGNOSIS — Z1509 Genetic susceptibility to other malignant neoplasm: Secondary | ICD-10-CM

## 2016-12-29 DIAGNOSIS — Z1502 Genetic susceptibility to malignant neoplasm of ovary: Secondary | ICD-10-CM

## 2016-12-29 DIAGNOSIS — C50919 Malignant neoplasm of unspecified site of unspecified female breast: Secondary | ICD-10-CM

## 2016-12-29 DIAGNOSIS — Z1589 Genetic susceptibility to other disease: Secondary | ICD-10-CM

## 2016-12-29 DIAGNOSIS — Z95828 Presence of other vascular implants and grafts: Secondary | ICD-10-CM

## 2016-12-29 LAB — COMPREHENSIVE METABOLIC PANEL
ALBUMIN: 4 g/dL (ref 3.5–5.0)
ALK PHOS: 65 U/L (ref 40–150)
ALT: 13 U/L (ref 0–55)
AST: 18 U/L (ref 5–34)
Anion Gap: 7 mEq/L (ref 3–11)
BUN: 20 mg/dL (ref 7.0–26.0)
CO2: 25 mEq/L (ref 22–29)
Calcium: 9.7 mg/dL (ref 8.4–10.4)
Chloride: 109 mEq/L (ref 98–109)
Creatinine: 1.1 mg/dL (ref 0.6–1.1)
EGFR: 64 mL/min/{1.73_m2} — AB (ref >90)
GLUCOSE: 100 mg/dL (ref 70–140)
POTASSIUM: 4 meq/L (ref 3.5–5.1)
SODIUM: 141 meq/L (ref 136–145)
Total Bilirubin: 0.62 mg/dL (ref 0.20–1.20)
Total Protein: 6.3 g/dL — ABNORMAL LOW (ref 6.4–8.3)

## 2016-12-29 LAB — CBC WITH DIFFERENTIAL/PLATELET
BASO%: 0.6 % (ref 0.0–2.0)
Basophils Absolute: 0 10*3/uL (ref 0.0–0.1)
EOS ABS: 0.3 10*3/uL (ref 0.0–0.5)
EOS%: 6.3 % (ref 0.0–7.0)
HCT: 38.2 % (ref 34.8–46.6)
HEMOGLOBIN: 12.8 g/dL (ref 11.6–15.9)
LYMPH%: 40.1 % (ref 14.0–49.7)
MCH: 29 pg (ref 25.1–34.0)
MCHC: 33.5 g/dL (ref 31.5–36.0)
MCV: 86.4 fL (ref 79.5–101.0)
MONO#: 0.3 10*3/uL (ref 0.1–0.9)
MONO%: 5.7 % (ref 0.0–14.0)
NEUT#: 2.4 10*3/uL (ref 1.5–6.5)
NEUT%: 47.3 % (ref 38.4–76.8)
Platelets: 226 10*3/uL (ref 145–400)
RBC: 4.42 10*6/uL (ref 3.70–5.45)
RDW: 13.8 % (ref 11.2–14.5)
WBC: 5.1 10*3/uL (ref 3.9–10.3)
lymph#: 2 10*3/uL (ref 0.9–3.3)

## 2016-12-29 MED ORDER — HEPARIN SOD (PORK) LOCK FLUSH 100 UNIT/ML IV SOLN
500.0000 [IU] | Freq: Once | INTRAVENOUS | Status: AC | PRN
  Administered 2016-12-29: 500 [IU] via INTRAVENOUS
  Filled 2016-12-29: qty 5

## 2016-12-29 MED ORDER — SODIUM CHLORIDE 0.9 % IJ SOLN
10.0000 mL | INTRAMUSCULAR | Status: DC | PRN
  Administered 2016-12-29: 10 mL via INTRAVENOUS
  Filled 2016-12-29: qty 10

## 2016-12-29 NOTE — Progress Notes (Signed)
Patient Care Team: Schoenhoff, Altamese Cabal, MD as PCP - General (Internal Medicine) Nicholas Lose, MD as Consulting Physician (Hematology and Oncology) Excell Seltzer, MD as Consulting Physician (General Surgery) Crissie Reese, MD as Consulting Physician (Plastic Surgery) Gery Pray, MD as Consulting Physician (Radiation Oncology)  DIAGNOSIS:  Encounter Diagnoses  Name Primary?  . Malignant neoplasm of central portion of right breast in female, estrogen receptor negative ([place])   . Dizziness Yes    SUMMARY OF ONCOLOGIC HISTORY:   Cancer of central portion of right female breast ([place])   11/08/2014 Mammogram    Segmental group of heterogeneous calcifications. Calcifications span 2.8 cm anterior posterior x 1.9 cm transverse x 0.6 cm craniocaudal      11/14/2014 Initial Diagnosis    Right breast biopsy 8:30 position: Invasive ductal carcinoma with DCIS, grade 2, ER 0%, PR 0%, HER-2 positive ratio 3.53      11/20/2014 Breast MRI    Right breast: 3.7 x 2.8 x 1.7 cm area of non-mass enhancement and retroareolar right breast into the lower outer quadrant and lower inner quadrant extending into the nipple involving anterior two thirds of the breast, no lymph nodes      11/20/2014 Clinical Stage    Stage IIA: T2 N0      12/04/2014 - 11/27/2015 Neo-Adjuvant Chemotherapy    Taxotere, carboplatin, Herceptin, Perjeta 6 cycles followed by Herceptin maintenance for 1 year      12/19/2014 Procedure    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      04/17/2015 Surgery    Right Mastectomy: Microscopic focus of DCIS 0.1 cm 0/2 LN Neg. No invasive cancer. Path CR;  Left Mastectomy: Neg      04/17/2015 Pathologic Stage    ypTis ypN0      07/04/2015 Surgery    Bilateral salpingo-oophorectomy      01/28/2016 Survivorship    SCP visit completed      03/31/2016 Miscellaneous    Neratinib Daily       CHIEF COMPLIANT: Follow-up on the retina  INTERVAL  HISTORY: Robin Miles is a 41 year old with above-mentioned history of HER-2 positive breast cancer. Completed neoadjuvant chemotherapy followed by mastectomy and a pathologic complete response. She underwent bilateral salpingectomy oophorectomy in December 2016 and is currently taking Neratinib daily. Start the September 2017. She had diarrhea related to Neratinib along with fatigue. We had to reduce the dose if Neratinib and she is able to tolerate it much better. She would finish Neratinib in the next 3 months. She denies any lumps or nodules in the axilla.she has had intermittent dizziness which I suspect is related to benign positional vertigo.she also complains of oral ulcers quite frequently.  REVIEW OF SYSTEMS:   Constitutional: Denies fevers, chills or abnormal weight loss Eyes: Denies blurriness of vision Ears, nose, mouth, throat, and face:oral aphthous ulcers Respiratory: Denies cough, dyspnea or wheezes Cardiovascular: Denies palpitation, chest discomfort Gastrointestinal:  Denies nausea, heartburn or change in bowel habits Skin: Denies abnormal skin rashes Lymphatics: Denies new lymphadenopathy or easy bruising Neurological:Denies numbness, tingling or new weaknesses, complains of dizziness Behavioral/Psych: Mood is stable, no new changes  Extremities: No lower extremity edema Breast:  denies any pain or lumps or nodules in either reconstructed breast All other systems were reviewed with the patient and are negative.  I have reviewed the past medical history, past surgical history, social history and family history with the patient and they are unchanged from previous note.  ALLERGIES:  is  allergic to compazine [prochlorperazine].  MEDICATIONS:  Current Outpatient Prescriptions  Medication Sig Dispense Refill  . escitalopram (LEXAPRO) 5 MG tablet Take 1 tablet (5 mg total) by mouth daily.    . Neratinib Maleate (NERLYNX) 40 MG tablet Take 4 tablets (160 mg total) by mouth daily.  Take with food. 120 tablet 6  . Probiotic Product (PROBIOTIC PO) Take 1 capsule by mouth daily.     No current facility-administered medications for this visit.     PHYSICAL EXAMINATION: ECOG PERFORMANCE STATUS: 1 - Symptomatic but completely ambulatory  Vitals:   12/29/16 0956  BP: 105/75  Pulse: (!) 56  Resp: 18  Temp: 98 F (36.7 C)   Filed Weights   12/29/16 0956  Weight: 123 lb 9.6 oz (56.1 kg)    GENERAL:alert, no distress and comfortable SKIN: skin color, texture, turgor are normal, no rashes or significant lesions EYES: normal, Conjunctiva are pink and non-injected, sclera clear OROPHARYNX:no exudate, no erythema and lips, buccal mucosa, and tongue normal  NECK: supple, thyroid normal size, non-tender, without nodularity LYMPH:  no palpable lymphadenopathy in the cervical, axillary or inguinal LUNGS: clear to auscultation and percussion with normal breathing effort HEART: regular rate & rhythm and no murmurs and no lower extremity edema ABDOMEN:abdomen soft, non-tender and normal bowel sounds MUSCULOSKELETAL:no cyanosis of digits and no clubbing  NEURO: alert & oriented x 3 with fluent speech, no focal motor/sensory deficits EXTREMITIES: No lower extremity edema BREAST: No palpable masses or nodules in either right or left reconstructed breasts. (exam performed in the presence of a chaperone)  LABORATORY DATA:  I have reviewed the data as listed   Chemistry      Component Value Date/Time   NA 141 12/29/2016 0930   K 4.0 12/29/2016 0930   CL 105 06/27/2015 1040   CO2 25 12/29/2016 0930   BUN 20.0 12/29/2016 0930   CREATININE 1.1 12/29/2016 0930      Component Value Date/Time   CALCIUM 9.7 12/29/2016 0930   ALKPHOS 65 12/29/2016 0930   AST 18 12/29/2016 0930   ALT 13 12/29/2016 0930   BILITOT 0.62 12/29/2016 0930       Lab Results  Component Value Date   WBC 5.1 12/29/2016   HGB 12.8 12/29/2016   HCT 38.2 12/29/2016   MCV 86.4 12/29/2016   PLT 226  12/29/2016   NEUTROABS 2.4 12/29/2016    ASSESSMENT & PLAN:  Cancer of central portion of right female breast Right breast retroareolar mass 3.7 x 2.8 x 1.7 cm involving anterior two thirds of the breast, T2 N0 M0 stage II a clinical stage, ER 0%, appears 0%, HER-2 positive ratio 3.53, PALB 2 mutation,   Treatment summary:  1. Completed neo-adjuvant chemotherapy with TCH Perjeta started 12/04/2014 and completed 03/20/2015  2. Right Mastectomy 04/17/15: Microscopic focus of DCIS 0.1 cm 0/2 LN Neg. No inv cancer. Path CR; Left Mastectomy: Neg  3. Completed Herceptin maintenance 11/27/2015 ----------------------------------------------------------------------------------------------------------------------------------------------------------------- Arthritisin her knees and hips  Severe hot flashessince she got oophorectomy: She could not tolerate neurontin. Echocardiogram April 2017: EF 55-60%   Current treatment: Neratinib started 03/31/2016 Neratinib Toxicities: 1. Decreased appetite 2. diarrhea grade 1 3. Flu-like illness every day: Decreasedthe dosage of Neratinib 4. Mouth sores: Decreased the dosage to160 mg 5. Fatigue: Due to Neratinib. TSH was normal at 1.7.  Bone pain involving the right thigh and left wrist: Bone scan will be ordered. Bone density 10/27/16: T score -0.9 (Normal)  Return to clinic in one-year for  follow-up   I spent 25 minutes talking to the patient of which more than half was spent in counseling and coordination of care.  Orders Placed This Encounter  Procedures  . Ambulatory referral to Physical Therapy    Referral Priority:   Routine    Referral Type:   Physical Medicine    Referral Reason:   Specialty Services Required    Requested Specialty:   Physical Therapy    Number of Visits Requested:   1   The patient has a good understanding of the overall plan. she agrees with it. she will call with any problems that may develop before the next  visit here.   Rulon Eisenmenger, MD 12/29/16

## 2016-12-29 NOTE — Telephone Encounter (Signed)
Called pt to confirm her appt for bone scan next week. Pt verbalized understanding and verified appt/time/date.

## 2017-01-05 ENCOUNTER — Encounter (HOSPITAL_COMMUNITY): Payer: Managed Care, Other (non HMO)

## 2017-01-08 ENCOUNTER — Encounter (HOSPITAL_COMMUNITY)
Admission: RE | Admit: 2017-01-08 | Discharge: 2017-01-08 | Disposition: A | Discharge status: Home / Self Care | Payer: Managed Care, Other (non HMO) | Source: Ambulatory Visit | Attending: Hematology and Oncology | Admitting: Hematology and Oncology

## 2017-01-08 DIAGNOSIS — C50111 Malignant neoplasm of central portion of right female breast: Secondary | ICD-10-CM | POA: Diagnosis present

## 2017-01-08 DIAGNOSIS — Z171 Estrogen receptor negative status [ER-]: Secondary | ICD-10-CM | POA: Diagnosis present

## 2017-01-08 MED ORDER — TECHNETIUM TC 99M MEDRONATE IV KIT
19.7000 | PACK | Freq: Once | INTRAVENOUS | Status: AC | PRN
  Administered 2017-01-08: 19.7 via INTRAVENOUS

## 2017-02-09 ENCOUNTER — Ambulatory Visit (HOSPITAL_BASED_OUTPATIENT_CLINIC_OR_DEPARTMENT_OTHER): Payer: Managed Care, Other (non HMO)

## 2017-02-09 DIAGNOSIS — Z1509 Genetic susceptibility to other malignant neoplasm: Secondary | ICD-10-CM

## 2017-02-09 DIAGNOSIS — C50111 Malignant neoplasm of central portion of right female breast: Secondary | ICD-10-CM

## 2017-02-09 DIAGNOSIS — Z1589 Genetic susceptibility to other disease: Secondary | ICD-10-CM

## 2017-02-09 DIAGNOSIS — Z452 Encounter for adjustment and management of vascular access device: Secondary | ICD-10-CM

## 2017-02-09 DIAGNOSIS — Z95828 Presence of other vascular implants and grafts: Secondary | ICD-10-CM

## 2017-02-09 DIAGNOSIS — Z1502 Genetic susceptibility to malignant neoplasm of ovary: Secondary | ICD-10-CM

## 2017-02-09 DIAGNOSIS — C50919 Malignant neoplasm of unspecified site of unspecified female breast: Secondary | ICD-10-CM

## 2017-02-09 MED ORDER — SODIUM CHLORIDE 0.9 % IJ SOLN
10.0000 mL | INTRAMUSCULAR | Status: DC | PRN
  Administered 2017-02-09: 10 mL via INTRAVENOUS
  Filled 2017-02-09: qty 10

## 2017-02-09 MED ORDER — HEPARIN SOD (PORK) LOCK FLUSH 100 UNIT/ML IV SOLN
500.0000 [IU] | Freq: Once | INTRAVENOUS | Status: AC | PRN
  Administered 2017-02-09: 500 [IU] via INTRAVENOUS
  Filled 2017-02-09: qty 5

## 2017-02-09 NOTE — Patient Instructions (Signed)

## 2017-03-30 ENCOUNTER — Telehealth: Payer: Self-pay | Admitting: *Deleted

## 2017-03-30 ENCOUNTER — Other Ambulatory Visit: Payer: Self-pay | Admitting: *Deleted

## 2017-03-30 DIAGNOSIS — C50919 Malignant neoplasm of unspecified site of unspecified female breast: Secondary | ICD-10-CM

## 2017-03-30 DIAGNOSIS — M898X9 Other specified disorders of bone, unspecified site: Secondary | ICD-10-CM

## 2017-03-30 DIAGNOSIS — C50111 Malignant neoplasm of central portion of right female breast: Secondary | ICD-10-CM

## 2017-03-30 DIAGNOSIS — Z171 Estrogen receptor negative status [ER-]: Secondary | ICD-10-CM

## 2017-03-30 DIAGNOSIS — Z1502 Genetic susceptibility to malignant neoplasm of ovary: Principal | ICD-10-CM

## 2017-03-30 DIAGNOSIS — Z1589 Genetic susceptibility to other disease: Principal | ICD-10-CM

## 2017-03-30 DIAGNOSIS — Z1509 Genetic susceptibility to other malignant neoplasm: Principal | ICD-10-CM

## 2017-03-30 NOTE — Telephone Encounter (Signed)
Per MD referral placed in EP[facility] for appointment for Rheumatology with Dr Rosana Hoes.  LOS sent with above request as well.

## 2017-04-07 ENCOUNTER — Telehealth: Payer: Self-pay

## 2017-04-07 ENCOUNTER — Other Ambulatory Visit: Payer: Self-pay

## 2017-04-07 DIAGNOSIS — Z1589 Genetic susceptibility to other disease: Secondary | ICD-10-CM

## 2017-04-07 DIAGNOSIS — Z1509 Genetic susceptibility to other malignant neoplasm: Secondary | ICD-10-CM

## 2017-04-07 DIAGNOSIS — C50111 Malignant neoplasm of central portion of right female breast: Secondary | ICD-10-CM

## 2017-04-07 DIAGNOSIS — C50919 Malignant neoplasm of unspecified site of unspecified female breast: Secondary | ICD-10-CM

## 2017-04-07 DIAGNOSIS — Z171 Estrogen receptor negative status [ER-]: Principal | ICD-10-CM

## 2017-04-07 DIAGNOSIS — Z1502 Genetic susceptibility to malignant neoplasm of ovary: Secondary | ICD-10-CM

## 2017-04-07 NOTE — Telephone Encounter (Signed)
Called and spoke with pt that this RN called to follow up Rheumatology appt with Dr.Devashwar's office and unable to get new pt in until feb 2019. Notified pt that a new referral sent out to [facility] for [place]. Pt having multiple aches and pain in the joints, bone and skin soreness. Told pt to allow 1-2 weeks for referral to go through. Pt verbalized understanding.

## 2017-04-08 MED FILL — ESCITALOPRAM 10 MG TABLET: 10 | 90 days supply | Qty: 135 | Fill #0

## 2017-04-12 ENCOUNTER — Telehealth: Payer: Self-pay | Admitting: Hematology and Oncology

## 2017-04-12 NOTE — Telephone Encounter (Signed)
Faxed records to Dr. Amil Amen

## 2017-05-04 ENCOUNTER — Ambulatory Visit (HOSPITAL_BASED_OUTPATIENT_CLINIC_OR_DEPARTMENT_OTHER): Payer: 59

## 2017-05-04 DIAGNOSIS — Z95828 Presence of other vascular implants and grafts: Secondary | ICD-10-CM

## 2017-05-04 DIAGNOSIS — Z452 Encounter for adjustment and management of vascular access device: Secondary | ICD-10-CM

## 2017-05-04 DIAGNOSIS — Z1589 Genetic susceptibility to other disease: Secondary | ICD-10-CM

## 2017-05-04 DIAGNOSIS — C50111 Malignant neoplasm of central portion of right female breast: Secondary | ICD-10-CM | POA: Diagnosis not present

## 2017-05-04 DIAGNOSIS — C50919 Malignant neoplasm of unspecified site of unspecified female breast: Secondary | ICD-10-CM

## 2017-05-04 DIAGNOSIS — Z1502 Genetic susceptibility to malignant neoplasm of ovary: Secondary | ICD-10-CM

## 2017-05-04 DIAGNOSIS — Z1509 Genetic susceptibility to other malignant neoplasm: Secondary | ICD-10-CM

## 2017-05-04 MED ORDER — HEPARIN SOD (PORK) LOCK FLUSH 100 UNIT/ML IV SOLN
500.0000 [IU] | Freq: Once | INTRAVENOUS | Status: AC | PRN
  Administered 2017-05-04: 500 [IU] via INTRAVENOUS
  Filled 2017-05-04: qty 5

## 2017-05-04 MED ORDER — SODIUM CHLORIDE 0.9 % IJ SOLN
10.0000 mL | INTRAMUSCULAR | Status: DC | PRN
  Administered 2017-05-04: 10 mL via INTRAVENOUS
  Filled 2017-05-04: qty 10

## 2017-05-20 MED FILL — ESTRADIOL-NORETH 1.0-0.5 MG: 1-0.5 | 28 days supply | Qty: 28 | Fill #0

## 2017-07-01 DIAGNOSIS — H524 Presbyopia: Secondary | ICD-10-CM | POA: Diagnosis not present

## 2017-07-12 MED FILL — ESCITALOPRAM 10 MG TABLET: 10 | 90 days supply | Qty: 135 | Fill #1

## 2017-07-20 MED FILL — ESTRADIOL-NORETH 1.0-0.5 MG: 1-0.5 | 28 days supply | Qty: 28 | Fill #1

## 2017-07-29 ENCOUNTER — Ambulatory Visit: Payer: Self-pay | Admitting: General Surgery

## 2017-08-19 ENCOUNTER — Encounter (HOSPITAL_BASED_OUTPATIENT_CLINIC_OR_DEPARTMENT_OTHER): Payer: Self-pay | Admitting: *Deleted

## 2017-08-26 ENCOUNTER — Encounter (HOSPITAL_BASED_OUTPATIENT_CLINIC_OR_DEPARTMENT_OTHER): Payer: Self-pay | Admitting: Anesthesiology

## 2017-08-27 ENCOUNTER — Ambulatory Visit (HOSPITAL_BASED_OUTPATIENT_CLINIC_OR_DEPARTMENT_OTHER)
Admission: RE | Admit: 2017-08-27 | Discharge: 2017-08-27 | Disposition: A | Discharge status: Home / Self Care | Payer: 59 | Source: Ambulatory Visit | Attending: General Surgery | Admitting: General Surgery

## 2017-08-27 ENCOUNTER — Encounter (HOSPITAL_BASED_OUTPATIENT_CLINIC_OR_DEPARTMENT_OTHER): Payer: Self-pay | Admitting: *Deleted

## 2017-08-27 ENCOUNTER — Encounter (HOSPITAL_BASED_OUTPATIENT_CLINIC_OR_DEPARTMENT_OTHER)
Admission: RE | Disposition: A | Discharge status: Home / Self Care | Payer: Self-pay | Source: Ambulatory Visit | Attending: General Surgery

## 2017-08-27 DIAGNOSIS — Z452 Encounter for adjustment and management of vascular access device: Secondary | ICD-10-CM | POA: Diagnosis not present

## 2017-08-27 DIAGNOSIS — Z888 Allergy status to other drugs, medicaments and biological substances status: Secondary | ICD-10-CM | POA: Diagnosis not present

## 2017-08-27 DIAGNOSIS — Z1501 Genetic susceptibility to malignant neoplasm of breast: Secondary | ICD-10-CM | POA: Diagnosis not present

## 2017-08-27 DIAGNOSIS — Z853 Personal history of malignant neoplasm of breast: Secondary | ICD-10-CM | POA: Diagnosis not present

## 2017-08-27 DIAGNOSIS — M170 Bilateral primary osteoarthritis of knee: Secondary | ICD-10-CM | POA: Diagnosis not present

## 2017-08-27 DIAGNOSIS — Z79899 Other long term (current) drug therapy: Secondary | ICD-10-CM | POA: Insufficient documentation

## 2017-08-27 DIAGNOSIS — Z9221 Personal history of antineoplastic chemotherapy: Secondary | ICD-10-CM | POA: Diagnosis not present

## 2017-08-27 DIAGNOSIS — Z9013 Acquired absence of bilateral breasts and nipples: Secondary | ICD-10-CM | POA: Insufficient documentation

## 2017-08-27 DIAGNOSIS — Z803 Family history of malignant neoplasm of breast: Secondary | ICD-10-CM | POA: Diagnosis not present

## 2017-08-27 HISTORY — PX: PORT-A-CATH REMOVAL: SHX5289

## 2017-08-27 SURGERY — MINOR REMOVAL PORT-A-CATH
Anesthesia: LOCAL

## 2017-08-27 MED ORDER — BACITRACIN ZINC 500 UNIT/GM EX OINT
TOPICAL_OINTMENT | CUTANEOUS | Status: AC
  Filled 2017-08-27: qty 28.35

## 2017-08-27 MED ORDER — BUPIVACAINE-EPINEPHRINE 0.25% -1:200000 IJ SOLN
INTRAMUSCULAR | Status: DC | PRN
  Administered 2017-08-27: 8 mL

## 2017-08-27 MED ORDER — LIDOCAINE HCL (PF) 1 % IJ SOLN
INTRAMUSCULAR | Status: DC | PRN
  Administered 2017-08-27: 8 mL

## 2017-08-27 MED ORDER — SODIUM BICARBONATE 4 % IV SOLN
INTRAVENOUS | Status: DC | PRN
  Administered 2017-08-27: 2 mL via INTRAVENOUS

## 2017-08-27 MED ORDER — CHLORHEXIDINE GLUCONATE CLOTH 2 % EX PADS: 6.0000 | MEDICATED_PAD | Freq: Once | CUTANEOUS | Status: DC

## 2017-08-27 MED ORDER — SODIUM BICARBONATE 4 % IV SOLN
INTRAVENOUS | Status: AC
  Filled 2017-08-27: qty 5

## 2017-08-27 MED ORDER — LIDOCAINE HCL (PF) 1 % IJ SOLN
INTRAMUSCULAR | Status: AC
  Filled 2017-08-27: qty 30

## 2017-08-27 SURGICAL SUPPLY — 27 items
ADH SKN CLS APL DERMABOND .7 (GAUZE/BANDAGES/DRESSINGS) ×1
BLADE SURG 15 STRL LF DISP TIS (BLADE) ×1 IMPLANT
BLADE SURG 15 STRL SS (BLADE) ×2
CHLORAPREP W/TINT 26ML (MISCELLANEOUS) ×2 IMPLANT
DERMABOND ADVANCED (GAUZE/BANDAGES/DRESSINGS) ×1
DERMABOND ADVANCED .7 DNX12 (GAUZE/BANDAGES/DRESSINGS) ×1 IMPLANT
ELECT REM PT RETURN 9FT ADLT (ELECTROSURGICAL)
ELECTRODE REM PT RTRN 9FT ADLT (ELECTROSURGICAL) IMPLANT
GAUZE SPONGE 4X4 16PLY XRAY LF (GAUZE/BANDAGES/DRESSINGS) ×1 IMPLANT
GLOVE BIO SURGEON STRL SZ 6.5 (GLOVE) ×1 IMPLANT
GLOVE BIOGEL PI IND STRL 7.0 (GLOVE) IMPLANT
GLOVE BIOGEL PI IND STRL 8 (GLOVE) ×1 IMPLANT
GLOVE BIOGEL PI INDICATOR 7.0 (GLOVE) ×1
GLOVE BIOGEL PI INDICATOR 8 (GLOVE) ×1
GLOVE ECLIPSE 7.5 STRL STRAW (GLOVE) ×2 IMPLANT
GOWN STRL REUS W/ TWL LRG LVL3 (GOWN DISPOSABLE) IMPLANT
GOWN STRL REUS W/ TWL XL LVL3 (GOWN DISPOSABLE) ×1 IMPLANT
GOWN STRL REUS W/TWL LRG LVL3 (GOWN DISPOSABLE) ×2
GOWN STRL REUS W/TWL XL LVL3 (GOWN DISPOSABLE) ×2
NDL HYPO 25X1 1.5 SAFETY (NEEDLE) ×1 IMPLANT
NEEDLE HYPO 25X1 1.5 SAFETY (NEEDLE) ×2 IMPLANT
PACK BASIN DAY SURGERY FS (CUSTOM PROCEDURE TRAY) ×1 IMPLANT
PENCIL BUTTON HOLSTER BLD 10FT (ELECTRODE) IMPLANT
SHEET MEDIUM DRAPE 40X70 STRL (DRAPES) ×1 IMPLANT
SUT MON AB 4-0 PC3 18 (SUTURE) ×2 IMPLANT
SYR CONTROL 10ML LL (SYRINGE) ×2 IMPLANT
TOWEL OR 17X24 6PK STRL BLUE (TOWEL DISPOSABLE) IMPLANT

## 2017-08-27 NOTE — Discharge Instructions (Signed)
Leave incision dry for 24 hours then may shower.  Do not soak for 1 week.  No creams or lotions on the site until the glue falls off. Call [phone] as needed for swelling, redness, drainage or other concerns. Tylenol or Advil as needed for discomfort.

## 2017-08-27 NOTE — H&P (Signed)
Robin Miles is an 42 y.o. female.    Chief Complaint: Unused Port-A-Cath  HPI: Patient has a history of breast cancer genetically related status post bilateral mastectomy with reconstruction.  HER-2 positive and had Port-A-Cath placement.  She has completed therapy and presents for Port-A-Cath removal.  Past Medical History:  Diagnosis Date  . Breast cancer ([place]) 2016   ER-/PR-/Her2+  . Breast cancer, right breast ([place])   . Complication of anesthesia    takes patient a long time to wake up after anesthesia  . Complication of anesthesia    "blurry vision for 3-4 days after anesthesia"  . Family history of breast cancer   . History of bronchitis   . History of chemotherapy   . Osteoarthritis of both knees   . 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 PONV (postoperative nausea and vomiting)     Past Surgical History:  Procedure Laterality Date  . BREAST RECONSTRUCTION WITH PLACEMENT OF TISSUE EXPANDER AND FLEX HD (ACELLULAR HYDRATED DERMIS) Bilateral 04/17/2015   Procedure: BILATERAL BREAST RECONSTRUCTION WITH PLACEMENT OF TISSUE EXPANDERS;  Surgeon: Crissie Reese, MD;  Location: [facility];  Service: Plastics;  Laterality: Bilateral;  . KNEE RECONSTRUCTION Right   . LAPAROSCOPIC BILATERAL SALPINGO OOPHERECTOMY Bilateral 07/04/2015   Procedure: LAPAROSCOPIC BILATERAL SALPINGO OOPHORECTOMY;  Surgeon: Olga Millers, MD;  Location: [facility];  Service: Gynecology;  Laterality: Bilateral;  . PORTACATH PLACEMENT N/A 11/30/2014   Procedure: INSERTION PORT-A-CATH;  Surgeon: Excell Seltzer, MD;  Location: [facility];  Service: General;  Laterality: N/A;  . REMOVAL OF BILATERAL TISSUE EXPANDERS WITH PLACEMENT OF BILATERAL BREAST IMPLANTS Bilateral 07/04/2015   Procedure: REMOVAL OF BILATERAL TISSUE EXPANDERS WITH PLACEMENT OF LEFT AND RIGHT BREAST IMPLANTS FOR DELAYED BREAST RECONSCTUCTION;  Surgeon: Crissie Reese, MD;  Location: [facility];  Service: Plastics;  Laterality: Bilateral;  .  TONSILECTOMY/ADENOIDECTOMY WITH MYRINGOTOMY    . TONSILLECTOMY    . TOTAL MASTECTOMY Bilateral 04/17/2015   Procedure: BILATERAL TOTAL MASTECTOMY;  Surgeon: Excell Seltzer, MD;  Location: [facility];  Service: General;  Laterality: Bilateral;  . TUBAL LIGATION      Family History  Problem Relation Age of Onset  . Breast cancer Maternal Aunt        dx in her late 64s  . Breast cancer Paternal Grandmother        dx in her 57s  . Breast cancer Cousin        dx late 18s; BRCA-  . Cancer Cousin        dx in her 77s; leiomyosarcoma   Social History:  reports that  has never smoked. she has never used smokeless tobacco. She reports that she does not drink alcohol or use drugs.  Allergies:  Allergies  Allergen Reactions  . Compazine [Prochlorperazine] Nausea And Vomiting    Makes sickness worse  . Synvisc [Hylan G-F 20]     Causes joint effusion    Medications Prior to Admission  Medication Sig Dispense Refill  . escitalopram (LEXAPRO) 5 MG tablet Take 1 tablet (5 mg total) by mouth daily.    . Probiotic Product (PROBIOTIC PO) Take 1 capsule by mouth daily.      No results found for this or any previous visit (from the past 48 hour(s)). No results found.  ROS  Blood pressure 97/61, pulse (!) 57, temperature 98.3 F (36.8 C), temperature source Oral, height 5' 2.5" (1.588 m), weight 55.8 kg (123 lb), SpO2 100 %. Physical Exam  General: Alert  healthy appearing female no distress Skin: No rash or infection Lungs: Clear easy respirations Cardiac: Regular rate and rhythm Chest wall: Port-A-Cath in place unremarkable left upper chest.  Bilateral reconstructions well-healed without skin or subcutaneous masses. Lymph nodes: No axillary masses palpable Neurologic: Alert and fully oriented.  Affect normal.  Assessment/Plan Unused Port-A-Cath.  For removal under local anesthesia.  Edward Jolly, MD 08/27/2017, 9:44 AM

## 2017-08-27 NOTE — Op Note (Signed)
Pre op diagnosis: Unused Port-A-Cath  Postop diagnosis: Same  Surgical procedure: Removal of Port-A-Cath  Surgeon: Excell Seltzer  Anesthesia: Local  Description of procedure: The patient is positioned supine and the anterior chest sterilely prepped and draped.Patient and procedure were verified. Local anesthesia was used to infiltrate the skin and underlying soft tissue. The previous incision was opened and sharp dissection carried down onto the port and catheter. The catheter was withdrawn intact. The port was sharply dissected out of the subcutaneous tissue and removed with all suture material. The subcutaneous tissue was closed with interrupted 4-0 Monocryl and the skin was closed with subcuticular 4-0 Monocryl and Dermabond. Patient tolerated the procedure well.   Edward Jolly MD, FACS  08/27/2017

## 2017-08-29 MED FILL — ESTRADIOL-NORETH 1.0-0.5 MG: 1-0.5 | 28 days supply | Qty: 28 | Fill #2

## 2017-08-30 ENCOUNTER — Encounter (HOSPITAL_BASED_OUTPATIENT_CLINIC_OR_DEPARTMENT_OTHER): Payer: Self-pay | Admitting: General Surgery

## 2017-09-02 ENCOUNTER — Other Ambulatory Visit: Payer: Self-pay | Admitting: Plastic Surgery

## 2017-09-02 DIAGNOSIS — T8543XA Leakage of breast prosthesis and implant, initial encounter: Secondary | ICD-10-CM

## 2017-09-02 DIAGNOSIS — Z853 Personal history of malignant neoplasm of breast: Secondary | ICD-10-CM

## 2017-09-02 DIAGNOSIS — N644 Mastodynia: Secondary | ICD-10-CM

## 2017-09-02 DIAGNOSIS — Z9882 Breast implant status: Secondary | ICD-10-CM | POA: Diagnosis not present

## 2017-09-09 ENCOUNTER — Ambulatory Visit
Admission: RE | Admit: 2017-09-09 | Discharge: 2017-09-09 | Disposition: A | Discharge status: Home / Self Care | Payer: 59 | Source: Ambulatory Visit | Attending: Plastic Surgery | Admitting: Plastic Surgery

## 2017-09-09 DIAGNOSIS — Z853 Personal history of malignant neoplasm of breast: Secondary | ICD-10-CM

## 2017-09-09 DIAGNOSIS — N644 Mastodynia: Secondary | ICD-10-CM

## 2017-09-09 DIAGNOSIS — T8543XA Leakage of breast prosthesis and implant, initial encounter: Secondary | ICD-10-CM

## 2017-09-09 MED ORDER — GADOBENATE DIMEGLUMINE 529 MG/ML IV SOLN
10.0000 mL | Freq: Once | INTRAVENOUS | Status: AC | PRN
  Administered 2017-09-09: 10 mL via INTRAVENOUS

## 2017-09-15 DIAGNOSIS — M791 Myalgia, unspecified site: Secondary | ICD-10-CM | POA: Diagnosis not present

## 2017-09-15 DIAGNOSIS — Z6823 Body mass index (BMI) 23.0-23.9, adult: Secondary | ICD-10-CM | POA: Diagnosis not present

## 2017-09-28 MED FILL — ESTRADIOL-NORETH 1.0-0.5 MG: 1-0.5 | 28 days supply | Qty: 28 | Fill #3

## 2017-10-13 MED FILL — ESCITALOPRAM 10 MG TABLET: 10 | 90 days supply | Qty: 135 | Fill #2

## 2017-10-26 MED FILL — ESTRADIOL-NORETH 1.0-0.5 MG: 1-0.5 | 28 days supply | Qty: 28 | Fill #4

## 2017-11-04 DIAGNOSIS — M25561 Pain in right knee: Secondary | ICD-10-CM | POA: Diagnosis not present

## 2017-11-22 MED FILL — ESTRADIOL-NORETH 1.0-0.5 MG: 1-0.5 | 28 days supply | Qty: 28 | Fill #5

## 2017-12-20 MED FILL — ESTRADIOL-NORETH 1.0-0.5 MG: 1-0.5 | 28 days supply | Qty: 28 | Fill #6

## 2017-12-29 ENCOUNTER — Inpatient Hospital Stay: Payer: 59 | Attending: Hematology and Oncology | Admitting: Hematology and Oncology

## 2017-12-29 ENCOUNTER — Telehealth: Payer: Self-pay | Admitting: Hematology and Oncology

## 2017-12-29 DIAGNOSIS — C50111 Malignant neoplasm of central portion of right female breast: Secondary | ICD-10-CM | POA: Insufficient documentation

## 2017-12-29 DIAGNOSIS — Z9013 Acquired absence of bilateral breasts and nipples: Secondary | ICD-10-CM | POA: Insufficient documentation

## 2017-12-29 DIAGNOSIS — Z1501 Genetic susceptibility to malignant neoplasm of breast: Secondary | ICD-10-CM | POA: Insufficient documentation

## 2017-12-29 DIAGNOSIS — R635 Abnormal weight gain: Secondary | ICD-10-CM | POA: Diagnosis not present

## 2017-12-29 DIAGNOSIS — Z9221 Personal history of antineoplastic chemotherapy: Secondary | ICD-10-CM | POA: Insufficient documentation

## 2017-12-29 DIAGNOSIS — Z90722 Acquired absence of ovaries, bilateral: Secondary | ICD-10-CM | POA: Diagnosis not present

## 2017-12-29 DIAGNOSIS — M1990 Unspecified osteoarthritis, unspecified site: Secondary | ICD-10-CM | POA: Insufficient documentation

## 2017-12-29 DIAGNOSIS — Z171 Estrogen receptor negative status [ER-]: Secondary | ICD-10-CM | POA: Diagnosis not present

## 2017-12-29 DIAGNOSIS — Z79899 Other long term (current) drug therapy: Secondary | ICD-10-CM | POA: Insufficient documentation

## 2017-12-29 MED ORDER — ESTRADIOL-NORETHINDRONE ACET 1-0.5 MG PO TABS: 1.0000 | ORAL_TABLET | Freq: Every day | ORAL | 12 refills | Status: DC

## 2017-12-29 NOTE — Telephone Encounter (Signed)
Gave patient avs and calendar of upcoming June 2020 appointments.  °

## 2017-12-29 NOTE — Progress Notes (Signed)
Patient Care Team: Schoenhoff, Altamese Cabal, MD as PCP - General (Internal Medicine) Nicholas Lose, MD as Consulting Physician (Hematology and Oncology) Excell Seltzer, MD as Consulting Physician (General Surgery) Crissie Reese, MD as Consulting Physician (Plastic Surgery) Gery Pray, MD as Consulting Physician (Radiation Oncology)  DIAGNOSIS:  Encounter Diagnosis  Name Primary?  . Malignant neoplasm of central portion of right breast in female, estrogen receptor negative ([place])     SUMMARY OF ONCOLOGIC HISTORY:   Cancer of central portion of right female breast ([place])   11/08/2014 Mammogram    Segmental group of heterogeneous calcifications. Calcifications span 2.8 cm anterior posterior x 1.9 cm transverse x 0.6 cm craniocaudal      11/14/2014 Initial Diagnosis    Right breast biopsy 8:30 position: Invasive ductal carcinoma with DCIS, grade 2, ER 0%, PR 0%, HER-2 positive ratio 3.53      11/20/2014 Breast MRI    Right breast: 3.7 x 2.8 x 1.7 cm area of non-mass enhancement and retroareolar right breast into the lower outer quadrant and lower inner quadrant extending into the nipple involving anterior two thirds of the breast, no lymph nodes      11/20/2014 Clinical Stage    Stage IIA: T2 N0      12/04/2014 - 11/27/2015 Neo-Adjuvant Chemotherapy    Taxotere, carboplatin, Herceptin, Perjeta 6 cycles followed by Herceptin maintenance for 1 year      12/19/2014 Procedure    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      04/17/2015 Surgery    Right Mastectomy: Microscopic focus of DCIS 0.1 cm 0/2 LN Neg. No invasive cancer. Path CR;  Left Mastectomy: Neg      04/17/2015 Pathologic Stage    ypTis ypN0      07/04/2015 Surgery    Bilateral salpingo-oophorectomy      01/28/2016 Survivorship    SCP visit completed      03/31/2016 Miscellaneous    Neratinib Daily       CHIEF COMPLIANT: Surveillance of breast cancer  INTERVAL HISTORY: Robin Miles is a 42 year old with above-mentioned history of HER-2 positive right breast cancer treated with neoadjuvant chemotherapy followed by mastectomies bilaterally because of PALB 2 mutation.  She also underwent bilateral salpingo-Oophrectomy and took a year of Neratinib.  She is here today for surveillance checkups.  In February 2019 she underwent a breast MRI for left breast pain which did not show any abnormalities. Her biggest concern is that she has not been able to lose weight.  In fact she has been gaining weight and she thinks that lack of estrogen is a cause and she will discuss this with her primary care physician and her gynecologist about increasing the dosage of estradiol or checking her thyroid function tests.  REVIEW OF SYSTEMS:   Constitutional: Denies fevers, chills or abnormal weight loss Eyes: Denies blurriness of vision Ears, nose, mouth, throat, and face: Denies mucositis or sore throat Respiratory: Denies cough, dyspnea or wheezes Cardiovascular: Denies palpitation, chest discomfort Gastrointestinal:  Denies nausea, heartburn or change in bowel habits Skin: Denies abnormal skin rashes Lymphatics: Denies new lymphadenopathy or easy bruising Neurological:Denies numbness, tingling or new weaknesses Behavioral/Psych: Mood is stable, no new changes  Extremities: No lower extremity edema Breast:  denies any pain or lumps or nodules in either reconstructed breasts All other systems were reviewed with the patient and are negative.  I have reviewed the past medical history, past surgical history, social history and family history with the  patient and they are unchanged from previous note.  ALLERGIES:  is allergic to compazine [prochlorperazine] and synvisc [hylan g-f 20].  MEDICATIONS:  Current Outpatient Medications  Medication Sig Dispense Refill  . escitalopram (LEXAPRO) 5 MG tablet Take 1 tablet (5 mg total) by mouth daily.    Marland Kitchen estradiol-norethindrone (ACTIVELLA) 1-0.5  MG tablet Take 1 tablet by mouth daily.  12  . Probiotic Product (PROBIOTIC PO) Take 1 capsule by mouth daily.     No current facility-administered medications for this visit.     PHYSICAL EXAMINATION: ECOG PERFORMANCE STATUS: 1 - Symptomatic but completely ambulatory  Vitals:   12/29/17 0934  BP: 102/76  Pulse: 60  Resp: 20  Temp: 98.4 F (36.9 C)  SpO2: 100%   Filed Weights   12/29/17 0934  Weight: 135 lb 14.4 oz (61.6 kg)    GENERAL:alert, no distress and comfortable SKIN: skin color, texture, turgor are normal, no rashes or significant lesions EYES: normal, Conjunctiva are pink and non-injected, sclera clear OROPHARYNX:no exudate, no erythema and lips, buccal mucosa, and tongue normal  NECK: supple, thyroid normal size, non-tender, without nodularity LYMPH:  no palpable lymphadenopathy in the cervical, axillary or inguinal LUNGS: clear to auscultation and percussion with normal breathing effort HEART: regular rate & rhythm and no murmurs and no lower extremity edema ABDOMEN:abdomen soft, non-tender and normal bowel sounds MUSCULOSKELETAL:no cyanosis of digits and no clubbing  NEURO: alert & oriented x 3 with fluent speech, no focal motor/sensory deficits EXTREMITIES: No lower extremity edema BREAST: No palpable masses or nodules in either right or left reconstructed breasts. No palpable axillary supraclavicular or infraclavicular adenopathy no breast tenderness (exam performed in the presence of a chaperone)  LABORATORY DATA:  I have reviewed the data as listed CMP Latest Ref Rng & Units 12/29/2016 10/06/2016 07/07/2016  Glucose 70 - 140 mg/dl 100 95 107  BUN 7.0 - 26.0 mg/dL 20.0 17.9 21.6  Creatinine 0.6 - 1.1 mg/dL 1.1 0.9 1.0  Sodium 136 - 145 mEq/L 141 141 140  Potassium 3.5 - 5.1 mEq/L 4.0 4.3 3.9  Chloride 101 - 111 mmol/L - - -  CO2 22 - 29 mEq/L 25 24 27   Calcium 8.4 - 10.4 mg/dL 9.7 9.7 10.0  Total Protein 6.4 - 8.3 g/dL 6.3(L) 6.4 6.5  Total Bilirubin  0.20 - 1.20 mg/dL 0.62 0.67 0.61  Alkaline Phos 40 - 150 U/L 65 65 69  AST 5 - 34 U/L 18 18 16   ALT 0 - 55 U/L 13 17 13     Lab Results  Component Value Date   WBC 5.1 12/29/2016   HGB 12.8 12/29/2016   HCT 38.2 12/29/2016   MCV 86.4 12/29/2016   PLT 226 12/29/2016   NEUTROABS 2.4 12/29/2016    ASSESSMENT & PLAN:  Cancer of central portion of right female breast Right breast retroareolar mass 3.7 x 2.8 x 1.7 cm involving anterior two thirds of the breast, T2 N0 M0 stage II a clinical stage, ER 0%, appears 0%, HER-2 positive ratio 3.53, PALB 2 mutation,   Treatment summary:  1. Completed neo-adjuvant chemotherapy with TCH Perjeta started 12/04/2014 and completed 03/20/2015  2. Right Mastectomy 04/17/15: Microscopic focus of DCIS 0.1 cm 0/2 LN Neg. No inv cancer. Path CR; Left Mastectomy: Neg  3. Completed Herceptin maintenance 11/27/2015 4. Neratinib started 03/31/2016-September 2018 ----------------------------------------------------------------------------------------------------------------------------------------------------------------- Arthritisin her knees and hips  Severe hot flashessince she got oophorectomy: She could not tolerate neurontin.  Bone pain involving the right thigh and left wrist:  Bone scan: Neg Bone density 10/27/16: T score -0.9 (Normal)  Breast cancer surveillance: 1.  Breast MRI 09/09/2017: Bilateral retropectoral implants without abnormality 2. Breast exam 12/29/2017: Benign.   Weight gain issues: She will have a complete blood work with her primary care physician in July and see if she has any thyroid deficiency.  Other than that she may increase the estradiol dosage to help with her menopausal symptoms.  She attributes her weight gain to menopausal state. Dr. Ouida Sills has prescribed her estradiol.  If she needs additional estrogen replacement therapy then it is reasonable to increase her dosage.  Return to clinic in one-year for follow-up   No  orders of the defined types were placed in this encounter.  The patient has a good understanding of the overall plan. she agrees with it. she will call with any problems that may develop before the next visit here.   Harriette Ohara, MD 12/29/17

## 2017-12-29 NOTE — Telephone Encounter (Signed)
Mailed patient calendar of upcoming June 2020 appointments

## 2017-12-29 NOTE — Assessment & Plan Note (Signed)
Right breast retroareolar mass 3.7 x 2.8 x 1.7 cm involving anterior two thirds of the breast, T2 N0 M0 stage II a clinical stage, ER 0%, appears 0%, HER-2 positive ratio 3.53, PALB 2 mutation,   Treatment summary:  1. Completed neo-adjuvant chemotherapy with TCH Perjeta started 12/04/2014 and completed 03/20/2015  2. Right Mastectomy 04/17/15: Microscopic focus of DCIS 0.1 cm 0/2 LN Neg. No inv cancer. Path CR; Left Mastectomy: Neg  3. Completed Herceptin maintenance 11/27/2015 4. Neratinib started 03/31/2016-September 2018 ----------------------------------------------------------------------------------------------------------------------------------------------------------------- Arthritisin her knees and hips  Severe hot flashessince she got oophorectomy: She could not tolerate neurontin.  Bone pain involving the right thigh and left wrist: Bone scan: Neg Bone density 10/27/16: T score -0.9 (Normal)  Breast cancer surveillance: 1.  Breast MRI 09/09/2017: Bilateral retropectoral implants without abnormality 2. Breast exam 12/29/2017: Benign.   Return to clinic in one-year for follow-up

## 2018-01-03 DIAGNOSIS — R61 Generalized hyperhidrosis: Secondary | ICD-10-CM | POA: Diagnosis not present

## 2018-01-13 MED FILL — ESTRADIOL-NORETH 1.0-0.5 MG: 1-0.5 | 14 days supply | Qty: 28 | Fill #0

## 2018-01-17 MED FILL — ESCITALOPRAM 10 MG TABLET: 10 | 90 days supply | Qty: 135 | Fill #0

## 2018-01-19 DIAGNOSIS — C50911 Malignant neoplasm of unspecified site of right female breast: Secondary | ICD-10-CM | POA: Diagnosis not present

## 2018-01-19 DIAGNOSIS — E8940 Asymptomatic postprocedural ovarian failure: Secondary | ICD-10-CM | POA: Diagnosis not present

## 2018-01-19 DIAGNOSIS — Z0000 Encounter for general adult medical examination without abnormal findings: Secondary | ICD-10-CM | POA: Diagnosis not present

## 2018-01-19 DIAGNOSIS — E7800 Pure hypercholesterolemia, unspecified: Secondary | ICD-10-CM | POA: Diagnosis not present

## 2018-01-19 DIAGNOSIS — E559 Vitamin D deficiency, unspecified: Secondary | ICD-10-CM | POA: Diagnosis not present

## 2018-02-02 DIAGNOSIS — R635 Abnormal weight gain: Secondary | ICD-10-CM | POA: Diagnosis not present

## 2018-02-02 DIAGNOSIS — N951 Menopausal and female climacteric states: Secondary | ICD-10-CM | POA: Diagnosis not present

## 2018-02-03 DIAGNOSIS — E7800 Pure hypercholesterolemia, unspecified: Secondary | ICD-10-CM | POA: Diagnosis not present

## 2018-02-09 DIAGNOSIS — N926 Irregular menstruation, unspecified: Secondary | ICD-10-CM | POA: Diagnosis not present

## 2018-02-09 DIAGNOSIS — E7800 Pure hypercholesterolemia, unspecified: Secondary | ICD-10-CM | POA: Diagnosis not present

## 2018-02-09 DIAGNOSIS — N951 Menopausal and female climacteric states: Secondary | ICD-10-CM | POA: Diagnosis not present

## 2018-02-09 DIAGNOSIS — C50911 Malignant neoplasm of unspecified site of right female breast: Secondary | ICD-10-CM | POA: Diagnosis not present

## 2018-02-09 DIAGNOSIS — E559 Vitamin D deficiency, unspecified: Secondary | ICD-10-CM | POA: Diagnosis not present

## 2018-03-22 MED FILL — NORETHIND-ETH ESTRAD 1-0.02: 1-20 | 28 days supply | Qty: 21 | Fill #0

## 2018-04-01 ENCOUNTER — Telehealth: Payer: Self-pay

## 2018-04-01 NOTE — Telephone Encounter (Signed)
Nurse spoke with patient.  Patient with enlarged lymph nodes on left side of neck.  Patient reports noticing this last Thursday.  Denies fever, or any recent sickness.  Patient states she has noticed an increased in fatigue.  PCP recommended to contact oncology.    Nurse encouraged patient to monitor temperature.  Will forward to provider for further recommendations.

## 2018-04-01 NOTE — Telephone Encounter (Signed)
Returned patient's call.  Left voicemail with contact information.

## 2018-04-07 ENCOUNTER — Telehealth: Payer: Self-pay | Admitting: Adult Health

## 2018-04-07 ENCOUNTER — Inpatient Hospital Stay: Payer: 59 | Attending: Hematology and Oncology | Admitting: Adult Health

## 2018-04-07 ENCOUNTER — Encounter: Payer: Self-pay | Admitting: Adult Health

## 2018-04-07 VITALS — BP 128/74 | HR 60 | Temp 99.4°F | Resp 18 | Ht 62.5 in

## 2018-04-07 DIAGNOSIS — Z1509 Genetic susceptibility to other malignant neoplasm: Secondary | ICD-10-CM | POA: Diagnosis not present

## 2018-04-07 DIAGNOSIS — Z9013 Acquired absence of bilateral breasts and nipples: Secondary | ICD-10-CM | POA: Diagnosis not present

## 2018-04-07 DIAGNOSIS — Z1379 Encounter for other screening for genetic and chromosomal anomalies: Secondary | ICD-10-CM | POA: Diagnosis not present

## 2018-04-07 DIAGNOSIS — Z803 Family history of malignant neoplasm of breast: Secondary | ICD-10-CM | POA: Insufficient documentation

## 2018-04-07 DIAGNOSIS — Z1589 Genetic susceptibility to other disease: Secondary | ICD-10-CM | POA: Diagnosis not present

## 2018-04-07 DIAGNOSIS — R5383 Other fatigue: Secondary | ICD-10-CM | POA: Diagnosis not present

## 2018-04-07 DIAGNOSIS — C50111 Malignant neoplasm of central portion of right female breast: Secondary | ICD-10-CM | POA: Insufficient documentation

## 2018-04-07 DIAGNOSIS — Z9221 Personal history of antineoplastic chemotherapy: Secondary | ICD-10-CM | POA: Diagnosis not present

## 2018-04-07 DIAGNOSIS — Z1502 Genetic susceptibility to malignant neoplasm of ovary: Secondary | ICD-10-CM

## 2018-04-07 DIAGNOSIS — Z79899 Other long term (current) drug therapy: Secondary | ICD-10-CM | POA: Insufficient documentation

## 2018-04-07 DIAGNOSIS — Z171 Estrogen receptor negative status [ER-]: Secondary | ICD-10-CM | POA: Insufficient documentation

## 2018-04-07 DIAGNOSIS — C50919 Malignant neoplasm of unspecified site of unspecified female breast: Secondary | ICD-10-CM

## 2018-04-07 NOTE — Telephone Encounter (Signed)
Per 9/19 los, no new orders.  °

## 2018-04-07 NOTE — Progress Notes (Signed)
[facility] Cancer Follow up:    Miles, Robin D, MD [address] [place] [address]   DIAGNOSIS: Cancer Staging Cancer of central portion of right female breast (HCC) Staging form: Breast, AJCC 7th Edition - Clinical stage from 11/20/2014: Stage IIA (T2, N0, M0) - Unsigned - Pathologic stage from 04/17/2015: Stage 0 (yTis (DCIS), N0, cM0) - Unsigned   SUMMARY OF ONCOLOGIC HISTORY:   Cancer of central portion of right female breast (HCC)   11/08/2014 Mammogram    Segmental group of heterogeneous calcifications. Calcifications span 2.8 cm anterior posterior x 1.9 cm transverse x 0.6 cm craniocaudal    11/14/2014 Initial Diagnosis    Right breast biopsy 8:30 position: Invasive ductal carcinoma with DCIS, grade 2, ER 0%, PR 0%, HER-2 positive ratio 3.53    11/20/2014 Breast MRI    Right breast: 3.7 x 2.8 x 1.7 cm area of non-mass enhancement and retroareolar right breast into the lower outer quadrant and lower inner quadrant extending into the nipple involving anterior two thirds of the breast, no lymph nodes    11/20/2014 Clinical Stage    Stage IIA: T2 N0    12/04/2014 - 11/27/2015 Neo-Adjuvant Chemotherapy    Taxotere, carboplatin, Herceptin, Perjeta 6 cycles followed by Herceptin maintenance for 1 year    12/19/2014 Procedure    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    04/17/2015 Surgery    Right Mastectomy: Microscopic focus of DCIS 0.1 cm 0/2 LN Neg. No invasive cancer. Path CR;  Left Mastectomy: Neg    04/17/2015 Pathologic Stage    ypTis ypN0    07/04/2015 Surgery    Bilateral salpingo-oophorectomy    01/28/2016 Survivorship    SCP visit completed    03/31/2016 Miscellaneous    Neratinib Daily     CURRENT THERAPY: observation  INTERVAL HISTORY: Robin Miles 41 y.o. female returns for evaluation of a swollen node in her neck, upper chest wall.  She is concerned about this appearance.  She noted this  about 2 weeks ago.  It is painless.  She denies any recent infections.  No cats.  She isn't having shortness of breath, difficulty swallowing, or any other concerns.     Patient Active Problem List   Diagnosis Date Noted  . Fever 04/08/2016  . Port catheter in place 03/18/2016  . Diarrhea 01/25/2015  . Anxiety 01/02/2015  . PALB2-related breast cancer (HCC)   . Genetic testing 12/19/2014  . Family history of breast cancer   . Cancer of central portion of right female breast (HCC) 11/26/2014    is allergic to compazine [prochlorperazine] and synvisc [hylan g-f 20].  MEDICAL HISTORY: Past Medical History:  Diagnosis Date  . Breast cancer (HCC) 2016   ER-/PR-/Her2+  . Breast cancer, right breast (HCC)   . Complication of anesthesia    takes patient a long time to wake up after anesthesia  . Complication of anesthesia    "blurry vision for 3-4 days after anesthesia"  . Family history of breast cancer   . History of bronchitis   . History of chemotherapy   . Osteoarthritis of both knees   . PALB2-related breast cancer (HCC)   . PONV (postoperative nausea and vomiting)     SURGICAL HISTORY: Past Surgical History:  Procedure Laterality Date  . BREAST RECONSTRUCTION WITH PLACEMENT OF TISSUE EXPANDER AND FLEX HD (ACELLULAR HYDRATED DERMIS) Bilateral 04/17/2015   Procedure: BILATERAL BREAST RECONSTRUCTION WITH PLACEMENT OF TISSUE   EXPANDERS;  Surgeon: David Bowers, MD;  Location: [facility] OR;  Service: Plastics;  Laterality: Bilateral;  . KNEE RECONSTRUCTION Right   . LAPAROSCOPIC BILATERAL SALPINGO OOPHERECTOMY Bilateral 07/04/2015   Procedure: LAPAROSCOPIC BILATERAL SALPINGO OOPHORECTOMY;  Surgeon: Mark E Anderson, MD;  Location: [facility] OR;  Service: Gynecology;  Laterality: Bilateral;  . PORT-A-CATH REMOVAL N/A 08/27/2017   Procedure: MINOR REMOVAL PORT-A-CATH;  Surgeon: Hoxworth, Benjamin, MD;  Location: [facility];  Service: General;  Laterality: N/A;  . PORTACATH PLACEMENT  N/A 11/30/2014   Procedure: INSERTION PORT-A-CATH;  Surgeon: Benjamin Hoxworth, MD;  Location: [facility];  Service: General;  Laterality: N/A;  . REMOVAL OF BILATERAL TISSUE EXPANDERS WITH PLACEMENT OF BILATERAL BREAST IMPLANTS Bilateral 07/04/2015   Procedure: REMOVAL OF BILATERAL TISSUE EXPANDERS WITH PLACEMENT OF LEFT AND RIGHT BREAST IMPLANTS FOR DELAYED BREAST RECONSCTUCTION;  Surgeon: David Bowers, MD;  Location: [facility] OR;  Service: Plastics;  Laterality: Bilateral;  . TONSILECTOMY/ADENOIDECTOMY WITH MYRINGOTOMY    . TONSILLECTOMY    . TOTAL MASTECTOMY Bilateral 04/17/2015   Procedure: BILATERAL TOTAL MASTECTOMY;  Surgeon: Benjamin Hoxworth, MD;  Location: [facility] OR;  Service: General;  Laterality: Bilateral;  . TUBAL LIGATION      SOCIAL HISTORY: Social History   Socioeconomic History  . Marital status: Married    Spouse name: Not on file  . Number of children: 6  . Years of education: Not on file  . Highest education level: Not on file  Occupational History  . Occupation: RN at [employer]  Social Needs  . Financial resource strain: Not on file  . Food insecurity:    Worry: Not on file    Inability: Not on file  . Transportation needs:    Medical: Not on file    Non-medical: Not on file  Tobacco Use  . Smoking status: Never Smoker  . Smokeless tobacco: Never Used  Substance and Sexual Activity  . Alcohol use: No    Alcohol/week: 0.0 standard drinks  . Drug use: No  . Sexual activity: Yes    Birth control/protection: Surgical  Lifestyle  . Physical activity:    Days per week: Not on file    Minutes per session: Not on file  . Stress: Not on file  Relationships  . Social connections:    Talks on phone: Not on file    Gets together: Not on file    Attends religious service: Not on file    Active member of club or organization: Not on file    Attends meetings of clubs or organizations: Not on file    Relationship status: Not on file  . Intimate  partner violence:    Fear of current or ex partner: Not on file    Emotionally abused: Not on file    Physically abused: Not on file    Forced sexual activity: Not on file  Other Topics Concern  . Not on file  Social History Narrative  . Not on file    FAMILY HISTORY: Family History  Problem Relation Age of Onset  . Breast cancer Maternal Aunt        dx in her late 60s  . Breast cancer Paternal Grandmother        dx in her 60s  . Breast cancer Cousin        dx late 40s; BRCA-  . Cancer Cousin        dx in her 40s; leiomyosarcoma    Review of Systems  Constitutional:   Positive for fatigue. Negative for appetite change, fever and unexpected weight change.  HENT:   Negative for hearing loss, lump/mass, sore throat, trouble swallowing and voice change.   Eyes: Negative for eye problems and icterus.  Respiratory: Negative for chest tightness, cough and shortness of breath.   Cardiovascular: Negative for chest pain, leg swelling and palpitations.  Gastrointestinal: Negative for abdominal pain, constipation, diarrhea, nausea and vomiting.  Endocrine: Negative for hot flashes.  Skin: Negative for itching and rash.  Neurological: Negative for dizziness, extremity weakness, headaches and numbness.  Hematological: Negative for adenopathy. Does not bruise/bleed easily.  Psychiatric/Behavioral: Negative for depression. The patient is not nervous/anxious.       PHYSICAL EXAMINATION  ECOG PERFORMANCE STATUS: 1 - Symptomatic but completely ambulatory  Vitals:   04/07/18 1043  BP: 128/74  Pulse: 60  Resp: 18  Temp: 99.4 F (37.4 C)  SpO2: 100%  Temperature inaccurate due to patient drinking coffee during time of assessment  Physical Exam  Constitutional: She is oriented to person, place, and time. She appears well-developed and well-nourished.  HENT:  Head: Normocephalic and atraumatic.  Mouth/Throat: Oropharynx is clear and moist.  Eyes: Pupils are equal, round, and reactive  to light. No scleral icterus.  Neck: Neck supple. No thyromegaly present.    5mm soft mobile nodule that is just above medial clavicle just laterally to the left at the base of the neck. Non tender No definite thyromegaly appreciated.  Area of concern noted on picture above  Cardiovascular: Normal rate, regular rhythm and normal heart sounds.  Pulmonary/Chest: Effort normal and breath sounds normal.  Breasts bilaterally s/p mastectomy and reconstruction, no nodules, swelling or masses noted.  Abdominal: Soft. Bowel sounds are normal.  Musculoskeletal: She exhibits no edema.  Lymphadenopathy:    She has no cervical adenopathy.  Neurological: She is alert and oriented to person, place, and time.  Skin: Skin is warm and dry. Capillary refill takes less than 2 seconds. No rash noted.  Psychiatric: She has a normal mood and affect.    LABORATORY DATA:  CBC    Component Value Date/Time   WBC 5.1 12/29/2016 0930   WBC 6.2 06/27/2015 1040   RBC 4.42 12/29/2016 0930   RBC 4.15 06/27/2015 1040   HGB 12.8 12/29/2016 0930   HCT 38.2 12/29/2016 0930   PLT 226 12/29/2016 0930   MCV 86.4 12/29/2016 0930   MCH 29.0 12/29/2016 0930   MCH 28.9 06/27/2015 1040   MCHC 33.5 12/29/2016 0930   MCHC 33.2 06/27/2015 1040   RDW 13.8 12/29/2016 0930   LYMPHSABS 2.0 12/29/2016 0930   MONOABS 0.3 12/29/2016 0930   EOSABS 0.3 12/29/2016 0930   BASOSABS 0.0 12/29/2016 0930    CMP     Component Value Date/Time   NA 141 12/29/2016 0930   K 4.0 12/29/2016 0930   CL 105 06/27/2015 1040   CO2 25 12/29/2016 0930   GLUCOSE 100 12/29/2016 0930   BUN 20.0 12/29/2016 0930   CREATININE 1.1 12/29/2016 0930   CALCIUM 9.7 12/29/2016 0930   PROT 6.3 (L) 12/29/2016 0930   ALBUMIN 4.0 12/29/2016 0930   AST 18 12/29/2016 0930   ALT 13 12/29/2016 0930   ALKPHOS 65 12/29/2016 0930   BILITOT 0.62 12/29/2016 0930   GFRNONAA >60 06/27/2015 1040   GFRAA >60 06/27/2015 1040      ASSESSMENT and PLAN:    Cancer of central portion of right female breast Right breast retroareolar mass 3.7 x 2.8 x 1.7   cm involving anterior two thirds of the breast, T2 N0 M0 stage II a clinical stage, ER 0%, appears 0%, HER-2 positive ratio 3.53, PALB 2 mutation,   Treatment summary:  1. Completed neo-adjuvant chemotherapy with TCH Perjeta started 12/04/2014 and completed 03/20/2015  2. Right Mastectomy 04/17/15: Microscopic focus of DCIS 0.1 cm 0/2 LN Neg. No inv cancer. Path CR; Left Mastectomy: Neg  3. Completed Herceptin maintenance 11/27/2015 4. Neratinib started 03/31/2016-September 2018 -----------------------------------------------------------------------------------------------------------------------------------------------------------------  Robin Miles has new small nodule at base of medial neck.  Will get CT neck w contrast to fully evaluate.  If negative, would then recommend eval by ENT.    Reviewed plan with Dr. Gudena who is in agreement.      Orders Placed This Encounter  Procedures  . CT Soft Tissue Neck W Contrast    Standing Status:   Future    Standing Expiration Date:   04/07/2019    Order Specific Question:   ** REASON FOR EXAM (FREE TEXT)    Answer:   nodule noted on lower central neck just above left medial clavicle    Order Specific Question:   If indicated for the ordered procedure, I authorize the administration of contrast media per Radiology protocol    Answer:   Yes    Order Specific Question:   Is patient pregnant?    Answer:   No    Order Specific Question:   Preferred imaging location?    Answer:   [facility]    Order Specific Question:   Radiology Contrast Protocol - do NOT remove file path    Answer:   [email or web address]    All questions were answered. The patient knows to call the clinic with any problems, questions or concerns. We can certainly see the patient much sooner if necessary.  A total of (20) minutes of face-to-face  time was spent with this patient with greater than 50% of that time in counseling and care-coordination.  This note was electronically signed. Lindsey C Causey, NP 04/07/2018 

## 2018-04-07 NOTE — Assessment & Plan Note (Signed)
Right breast retroareolar mass 3.7 x 2.8 x 1.7 cm involving anterior two thirds of the breast, T2 N0 M0 stage II a clinical stage, ER 0%, appears 0%, HER-2 positive ratio 3.53, PALB 2 mutation,   Treatment summary:  1. Completed neo-adjuvant chemotherapy with TCH Perjeta started 12/04/2014 and completed 03/20/2015  2. Right Mastectomy 04/17/15: Microscopic focus of DCIS 0.1 cm 0/2 LN Neg. No inv cancer. Path CR; Left Mastectomy: Neg  3. Completed Herceptin maintenance 11/27/2015 4. Neratinib started 03/31/2016-September 2018 -----------------------------------------------------------------------------------------------------------------------------------------------------------------  Lehua has new small nodule at base of medial neck.  Will get CT neck w contrast to fully evaluate.  If negative, would then recommend eval by ENT.    Reviewed plan with Dr. Lindi Adie who is in agreement.

## 2018-04-13 ENCOUNTER — Telehealth: Payer: Self-pay

## 2018-04-13 NOTE — Telephone Encounter (Signed)
Called pt and lvm with call back number to notify her that she may call to schedule her CT scan.

## 2018-04-19 ENCOUNTER — Ambulatory Visit (HOSPITAL_COMMUNITY)
Admission: RE | Admit: 2018-04-19 | Discharge: 2018-04-19 | Disposition: A | Discharge status: Home / Self Care | Payer: 59 | Source: Ambulatory Visit | Attending: Adult Health | Admitting: Adult Health

## 2018-04-19 ENCOUNTER — Encounter: Payer: Self-pay | Admitting: Adult Health

## 2018-04-19 DIAGNOSIS — Z1589 Genetic susceptibility to other disease: Secondary | ICD-10-CM | POA: Diagnosis not present

## 2018-04-19 DIAGNOSIS — C50111 Malignant neoplasm of central portion of right female breast: Secondary | ICD-10-CM | POA: Insufficient documentation

## 2018-04-19 DIAGNOSIS — Z1502 Genetic susceptibility to malignant neoplasm of ovary: Secondary | ICD-10-CM | POA: Diagnosis not present

## 2018-04-19 DIAGNOSIS — Z1509 Genetic susceptibility to other malignant neoplasm: Secondary | ICD-10-CM | POA: Diagnosis not present

## 2018-04-19 DIAGNOSIS — C50919 Malignant neoplasm of unspecified site of unspecified female breast: Secondary | ICD-10-CM | POA: Diagnosis not present

## 2018-04-19 DIAGNOSIS — R221 Localized swelling, mass and lump, neck: Secondary | ICD-10-CM | POA: Insufficient documentation

## 2018-04-19 DIAGNOSIS — Z171 Estrogen receptor negative status [ER-]: Secondary | ICD-10-CM | POA: Diagnosis not present

## 2018-04-19 DIAGNOSIS — R599 Enlarged lymph nodes, unspecified: Secondary | ICD-10-CM | POA: Diagnosis not present

## 2018-04-19 MED ORDER — SODIUM CHLORIDE 0.9 % IJ SOLN
INTRAMUSCULAR | Status: AC
  Filled 2018-04-19: qty 50

## 2018-04-19 MED ORDER — IOHEXOL 300 MG/ML  SOLN
75.0000 mL | Freq: Once | INTRAMUSCULAR | Status: AC | PRN
  Administered 2018-04-19: 75 mL via INTRAVENOUS

## 2018-05-09 MED FILL — ESCITALOPRAM 10 MG TABLET: 10 | 90 days supply | Qty: 135 | Fill #0

## 2018-05-10 DIAGNOSIS — E7800 Pure hypercholesterolemia, unspecified: Secondary | ICD-10-CM | POA: Diagnosis not present

## 2018-05-10 DIAGNOSIS — R221 Localized swelling, mass and lump, neck: Secondary | ICD-10-CM | POA: Diagnosis not present

## 2018-05-17 DIAGNOSIS — Z853 Personal history of malignant neoplasm of breast: Secondary | ICD-10-CM | POA: Diagnosis not present

## 2018-05-17 DIAGNOSIS — R221 Localized swelling, mass and lump, neck: Secondary | ICD-10-CM | POA: Diagnosis not present

## 2018-06-06 ENCOUNTER — Other Ambulatory Visit: Payer: Self-pay | Admitting: Internal Medicine

## 2018-06-06 ENCOUNTER — Ambulatory Visit
Admission: RE | Admit: 2018-06-06 | Discharge: 2018-06-06 | Disposition: A | Discharge status: Home / Self Care | Payer: 59 | Source: Ambulatory Visit | Attending: Internal Medicine | Admitting: Internal Medicine

## 2018-06-06 DIAGNOSIS — R05 Cough: Secondary | ICD-10-CM

## 2018-06-06 DIAGNOSIS — R059 Cough, unspecified: Secondary | ICD-10-CM

## 2018-06-06 DIAGNOSIS — R509 Fever, unspecified: Secondary | ICD-10-CM | POA: Diagnosis not present

## 2018-06-06 DIAGNOSIS — J189 Pneumonia, unspecified organism: Secondary | ICD-10-CM | POA: Diagnosis not present

## 2018-06-06 MED FILL — HYDROCODONE-HOMATROPINE SOL: 5-1.5 | 5 days supply | Qty: 100 | Fill #0

## 2018-06-06 MED FILL — AZITHROMYCIN 250 MG TABLET: 250 | 5 days supply | Qty: 6 | Fill #0

## 2018-06-06 MED FILL — VENTOLIN HFA 90 MCG INHALER: 108 (90 BAS | 33 days supply | Qty: 18 | Fill #0

## 2018-06-22 MED FILL — LO LOESTRIN FE 1-10 TABLET: 1 MG-10 MCG | 28 days supply | Qty: 28 | Fill #0

## 2018-07-06 ENCOUNTER — Ambulatory Visit
Admission: RE | Admit: 2018-07-06 | Discharge: 2018-07-06 | Disposition: A | Discharge status: Home / Self Care | Payer: Managed Care, Other (non HMO) | Source: Ambulatory Visit | Attending: Internal Medicine | Admitting: Internal Medicine

## 2018-07-06 ENCOUNTER — Other Ambulatory Visit: Payer: Self-pay

## 2018-07-06 ENCOUNTER — Other Ambulatory Visit: Payer: Self-pay | Admitting: Internal Medicine

## 2018-07-06 DIAGNOSIS — J189 Pneumonia, unspecified organism: Secondary | ICD-10-CM | POA: Diagnosis not present

## 2018-07-06 DIAGNOSIS — J181 Lobar pneumonia, unspecified organism: Principal | ICD-10-CM

## 2018-07-06 DIAGNOSIS — R05 Cough: Secondary | ICD-10-CM | POA: Diagnosis not present

## 2018-07-18 MED FILL — LO LOESTRIN FE 1-10 TABLET: 1 MG-10 MCG | 28 days supply | Qty: 28 | Fill #1

## 2018-09-22 MED FILL — ESCITALOPRAM 10 MG TABLET: 10 | 30 days supply | Qty: 45 | Fill #0

## 2018-09-22 MED FILL — LO LOESTRIN FE 1-10 TABLET: 1 MG-10 MCG | 28 days supply | Qty: 28 | Fill #2

## 2018-09-28 DIAGNOSIS — Z111 Encounter for screening for respiratory tuberculosis: Secondary | ICD-10-CM | POA: Diagnosis not present

## 2018-10-17 MED FILL — LO LOESTRIN FE 1-10 TABLET: 1 MG-10 MCG | 28 days supply | Qty: 28 | Fill #0

## 2018-10-18 MED FILL — ESCITALOPRAM 10 MG TABLET: 10 | 30 days supply | Qty: 45 | Fill #1

## 2018-10-25 DIAGNOSIS — M25551 Pain in right hip: Secondary | ICD-10-CM | POA: Diagnosis not present

## 2018-10-25 DIAGNOSIS — M25561 Pain in right knee: Secondary | ICD-10-CM | POA: Diagnosis not present

## 2018-11-15 DIAGNOSIS — M25551 Pain in right hip: Secondary | ICD-10-CM | POA: Diagnosis not present

## 2018-11-26 MED FILL — LO LOESTRIN FE 1-10 TABLET: 1 MG-10 MCG | 28 days supply | Qty: 28 | Fill #1

## 2018-11-26 MED FILL — ESCITALOPRAM 10 MG TABLET: 10 | 30 days supply | Qty: 45 | Fill #2

## 2018-12-09 DIAGNOSIS — M1711 Unilateral primary osteoarthritis, right knee: Secondary | ICD-10-CM | POA: Diagnosis not present

## 2018-12-09 DIAGNOSIS — M25551 Pain in right hip: Secondary | ICD-10-CM | POA: Diagnosis not present

## 2018-12-14 ENCOUNTER — Telehealth: Payer: Self-pay | Admitting: Hematology and Oncology

## 2018-12-14 NOTE — Telephone Encounter (Signed)
Patient called back and moved 6/22 f/u to 6/23.

## 2018-12-14 NOTE — Telephone Encounter (Signed)
[place] moved from 6/17 to 6/22. Spoke with patient. Patient will call back to confirm if day ok.

## 2018-12-25 MED FILL — LO LOESTRIN FE 1-10 TABLET: 1 MG-10 MCG | 28 days supply | Qty: 28 | Fill #2

## 2019-01-04 ENCOUNTER — Ambulatory Visit: Payer: 59 | Admitting: Hematology and Oncology

## 2019-01-04 NOTE — Assessment & Plan Note (Signed)
Right breast retroareolar mass 3.7 x 2.8 x 1.7 cm involving anterior two thirds of the breast, T2 N0 M0 stage II a clinical stage, ER 0%, appears 0%, HER-2 positive ratio 3.53, PALB 2 mutation,   Treatment summary:  1. Completed neo-adjuvant chemotherapy with TCH Perjeta started 12/04/2014 and completed 03/20/2015  2. Right Mastectomy 04/17/15: Microscopic focus of DCIS 0.1 cm 0/2 LN Neg. No inv cancer. Path CR; Left Mastectomy: Neg  3. Completed Herceptin maintenance 11/27/2015 4. Neratinib started 03/31/2016-September 2018 ----------------------------------------------------------------------------------------------------------------------------------------------------------------- Arthritisin her knees and hips  Severe hot flashessince she got oophorectomy: She could not tolerate neurontin.  Bone pain involving the right thigh and left wrist: Bone scan: Neg Bone density4/10/18: T score -0.9 (Normal)  Breast cancer surveillance: 1.  Breast MRI 09/09/2017: Bilateral retropectoral implants without abnormality 2. Breast exam 12/29/2017: Benign.   Weight gain issues: Currently on estradiol Return to clinic inone-year for follow-up

## 2019-01-05 ENCOUNTER — Telehealth: Payer: Self-pay | Admitting: Hematology and Oncology

## 2019-01-05 NOTE — Telephone Encounter (Signed)
I left a message regarding video visit  °

## 2019-01-09 ENCOUNTER — Ambulatory Visit: Payer: 59 | Admitting: Hematology and Oncology

## 2019-01-09 NOTE — Progress Notes (Signed)
HEMATOLOGY-ONCOLOGY [facility] VIDEO VISIT PROGRESS NOTE  I connected with Robin Miles on 01/10/2019 at  9:15 AM EDT by MyChart video conference and verified that I am speaking with the correct person using two identifiers.  I discussed the limitations, risks, security and privacy concerns of performing an evaluation and management service by MyChart and the availability of in person appointments.  I also discussed with the patient that there may be a patient responsible charge related to this service. The patient expressed understanding and agreed to proceed.  Patient's Location: Home Physician Location: Clinic  CHIEF COMPLIANT: Surveillance of breast cancer  INTERVAL HISTORY: Robin Miles is a 43 y.o. female with above-mentioned history of HER-2 positive right breast cancer treated with neoadjuvant chemotherapy, mastectomies bilaterally because of PALB 2 mutation, bilateral salpingo-oophrectomy, and a year of Neratinib. I last saw her a year ago. In the interim she was seen by Wilber Bihari, NP on 04/07/18 for a swollen lymph node in her neck. CT neck showed no lymphadenopathy or mass. She is here today over MyChart video visit for surveillance checkup.  Oncology History  Cancer of central portion of right female breast ([place])  11/08/2014 Mammogram   Segmental group of heterogeneous calcifications. Calcifications span 2.8 cm anterior posterior x 1.9 cm transverse x 0.6 cm craniocaudal   11/14/2014 Initial Diagnosis   Right breast biopsy 8:30 position: Invasive ductal carcinoma with DCIS, grade 2, ER 0%, PR 0%, HER-2 positive ratio 3.53   11/20/2014 Breast MRI   Right breast: 3.7 x 2.8 x 1.7 cm area of non-mass enhancement and retroareolar right breast into the lower outer quadrant and lower inner quadrant extending into the nipple involving anterior two thirds of the breast, no lymph nodes   11/20/2014 Clinical Stage   Stage IIA: T2 N0   12/04/2014 - 11/27/2015 Neo-Adjuvant Chemotherapy   Taxotere,  carboplatin, Herceptin, Perjeta 6 cycles followed by Herceptin maintenance for 1 year   12/19/2014 Procedure   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   04/17/2015 Surgery   Right Mastectomy: Microscopic focus of DCIS 0.1 cm 0/2 LN Neg. No invasive cancer. Path CR;  Left Mastectomy: Neg   04/17/2015 Pathologic Stage   ypTis ypN0   07/04/2015 Surgery   Bilateral salpingo-oophorectomy   01/28/2016 Survivorship   SCP visit completed   03/31/2016 Miscellaneous   Neratinib Daily     REVIEW OF SYSTEMS:   Constitutional: Denies fevers, chills or abnormal weight loss Eyes: Denies blurriness of vision Ears, nose, mouth, throat, and face: Denies mucositis or sore throat Respiratory: Denies cough, dyspnea or wheezes Cardiovascular: Denies palpitation, chest discomfort Gastrointestinal:  Denies nausea, heartburn or change in bowel habits Skin: Denies abnormal skin rashes Lymphatics: Denies new lymphadenopathy or easy bruising Neurological:Denies numbness, tingling or new weaknesses Behavioral/Psych: Mood is stable, no new changes  Extremities: No lower extremity edema Breast: denies any pain or lumps or nodules in either breasts All other systems were reviewed with the patient and are negative.  Observations/Objective:  There were no vitals filed for this visit. There is no height or weight on file to calculate BMI.  I have reviewed the data as listed CMP Latest Ref Rng & Units 12/29/2016 10/06/2016 07/07/2016  Glucose 70 - 140 mg/dl 100 95 107  BUN 7.0 - 26.0 mg/dL 20.0 17.9 21.6  Creatinine 0.6 - 1.1 mg/dL 1.1 0.9 1.0  Sodium 136 - 145 mEq/L 141 141 140  Potassium 3.5 - 5.1 mEq/L 4.0 4.3 3.9  Chloride 101 -  111 mmol/L - - -  CO2 22 - 29 mEq/L _0 Calcium 8.4 - 10.4 mg/dL 9.7 9.7 10.0  Total Protein 6.4 - 8.3 g/dL 6.3(L) 6.4 6.5  Total Bilirubin 0.20 - 1.20 mg/dL 0.62 0.67 0.61  Alkaline Phos 40 - 150 U/L 65 65 69  AST 5 - 34 U/L _1 ALT 0 - 55 U/L _2 Lab Results  Component Value Date   WBC 5.1 12/29/2016   HGB 12.8 12/29/2016   HCT 38.2 12/29/2016   MCV 86.4 12/29/2016   PLT 226 12/29/2016   NEUTROABS 2.4 12/29/2016      Assessment Plan:  Cancer of central portion of right female breast Right breast retroareolar mass 3.7 x 2.8 x 1.7 cm involving anterior two thirds of the breast, T2 N0 M0 stage II a clinical stage, ER 0%, appears 0%, HER-2 positive ratio 3.53, PALB 2 mutation,   Treatment summary:  1. Completed neo-adjuvant chemotherapy with TCH Perjeta started 12/04/2014 and completed 03/20/2015  2. Right Mastectomy 04/17/15: Microscopic focus of DCIS 0.1 cm 0/2 LN Neg. No inv cancer. Path CR; Left Mastectomy: Neg  3. Completed Herceptin maintenance 11/27/2015 4. Neratinib started 03/31/2016-September 2018 ----------------------------------------------------------------------------------------------------------------------------------------------------------------- Arthritisin her knees and hips  Severe hot flashessince she got oophorectomy: She could not tolerate neurontin.  Bone pain involving the right thigh and left wrist: Bone scan: Neg Bone density4/10/18: T score -0.9 (Normal)  Breast cancer surveillance: 1.  Breast MRI 09/09/2017: Bilateral retropectoral implants without abnormality 2. Breast exam 12/29/2017: Benign.   Weight gain issues: Currently on estradiol, weight is improving. She has been walking for exercise.  Return to clinic inone-year for follow-up    I discussed the assessment and treatment plan with the patient. The patient was provided an opportunity to ask questions and all were answered. The patient agreed with the plan and demonstrated an understanding of the instructions. The patient was advised to call back or seek an in-person evaluation if the symptoms worsen or if the condition fails to improve as anticipated.   I provided 15 minutes of face-to-face  MyChart video visit time during this encounter.    Rulon Eisenmenger, MD 01/10/2019   I, Molly Dorshimer, am acting as scribe for Nicholas Lose, MD.  I have reviewed the above documentation for accuracy and completeness, and I agree with the above.

## 2019-01-10 ENCOUNTER — Inpatient Hospital Stay: Payer: BC Managed Care – PPO | Attending: Hematology and Oncology | Admitting: Hematology and Oncology

## 2019-01-10 DIAGNOSIS — Z9011 Acquired absence of right breast and nipple: Secondary | ICD-10-CM | POA: Diagnosis not present

## 2019-01-10 DIAGNOSIS — C50111 Malignant neoplasm of central portion of right female breast: Secondary | ICD-10-CM | POA: Diagnosis not present

## 2019-01-10 DIAGNOSIS — Z171 Estrogen receptor negative status [ER-]: Secondary | ICD-10-CM

## 2019-01-10 DIAGNOSIS — Z9221 Personal history of antineoplastic chemotherapy: Secondary | ICD-10-CM

## 2019-01-16 MED FILL — LO LOESTRIN FE 1-10 TABLET: 1 MG-10 MCG | 28 days supply | Qty: 28 | Fill #0

## 2019-02-13 MED FILL — LO LOESTRIN FE 1-10 TABLET: 1 MG-10 MCG | 28 days supply | Qty: 28 | Fill #1

## 2019-02-16 DIAGNOSIS — Z6823 Body mass index (BMI) 23.0-23.9, adult: Secondary | ICD-10-CM | POA: Diagnosis not present

## 2019-02-16 DIAGNOSIS — Z124 Encounter for screening for malignant neoplasm of cervix: Secondary | ICD-10-CM | POA: Diagnosis not present

## 2019-02-16 DIAGNOSIS — R87610 Atypical squamous cells of undetermined significance on cytologic smear of cervix (ASC-US): Secondary | ICD-10-CM | POA: Diagnosis not present

## 2019-02-16 DIAGNOSIS — Z01419 Encounter for gynecological examination (general) (routine) without abnormal findings: Secondary | ICD-10-CM | POA: Diagnosis not present

## 2019-02-16 DIAGNOSIS — R350 Frequency of micturition: Secondary | ICD-10-CM | POA: Diagnosis not present

## 2019-02-16 MED FILL — TERCONAZOLE 0.8% VAGINAL CR: 0.8 | 3 days supply | Qty: 20 | Fill #0

## 2019-03-11 MED FILL — LO LOESTRIN FE 1-10 TABLET: 1 MG-10 MCG | 28 days supply | Qty: 28 | Fill #2

## 2019-04-08 MED FILL — LO LOESTRIN FE 1-10 TABLET: 1 MG-10 MCG | 28 days supply | Qty: 28 | Fill #0

## 2019-05-09 ENCOUNTER — Ambulatory Visit (INDEPENDENT_AMBULATORY_CARE_PROVIDER_SITE_OTHER): Payer: BC Managed Care – PPO | Admitting: Internal Medicine

## 2019-05-09 ENCOUNTER — Encounter: Payer: Self-pay | Admitting: Internal Medicine

## 2019-05-09 ENCOUNTER — Other Ambulatory Visit: Payer: Self-pay

## 2019-05-09 VITALS — BP 130/90 | HR 60 | Temp 98.2°F | Ht 62.0 in | Wt 136.9 lb

## 2019-05-09 DIAGNOSIS — Z1509 Genetic susceptibility to other malignant neoplasm: Secondary | ICD-10-CM

## 2019-05-09 DIAGNOSIS — Z1502 Genetic susceptibility to malignant neoplasm of ovary: Secondary | ICD-10-CM | POA: Diagnosis not present

## 2019-05-09 DIAGNOSIS — Z1589 Genetic susceptibility to other disease: Secondary | ICD-10-CM

## 2019-05-09 DIAGNOSIS — Z79890 Hormone replacement therapy (postmenopausal): Secondary | ICD-10-CM

## 2019-05-09 DIAGNOSIS — N959 Unspecified menopausal and perimenopausal disorder: Secondary | ICD-10-CM | POA: Diagnosis not present

## 2019-05-09 DIAGNOSIS — C50919 Malignant neoplasm of unspecified site of unspecified female breast: Secondary | ICD-10-CM | POA: Diagnosis not present

## 2019-05-09 NOTE — Progress Notes (Signed)
Established Patient Office Visit     CC/Reason for Visit: Establish care, discuss chronic conditions  HPI: Robin Miles is a 43 y.o. female who is coming in today for the above mentioned reasons.  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  She has 6 children including 2 sets of twins, she herself is a twin.  She is a never smoker, does not drink alcohol.  Has allergies to Compazine, she has had bilateral mastectomies and bilateral salpingo-oophorectomies.  She has been maintained on postmenopausal hormone replacement therapy as well as Lexapro for severe hot flashes.  She is here mainly to establish care as her prior PCP has retired.  She has been noticed to have elevated triglycerides in the past but not medicated for this.  She has no acute complaints today.  She follows with GYN, Dr. Ouida Sills.  Past Medical/Surgical History: Past Medical History:  Diagnosis Date  . Breast cancer ([place]) 2016   ER-/PR-/Her2+  . Breast cancer, right breast ([place])   . Complication of anesthesia    takes patient a long time to wake up after anesthesia  . Complication of anesthesia    "blurry vision for 3-4 days after anesthesia"  . Family history of breast cancer   . History of bronchitis   . History of chemotherapy   . Osteoarthritis of both knees   . PALB2-related breast cancer ([place])   . PONV (postoperative nausea and vomiting)     Past Surgical History:  Procedure Laterality Date  . BREAST RECONSTRUCTION WITH PLACEMENT OF TISSUE EXPANDER AND FLEX HD (ACELLULAR HYDRATED DERMIS) Bilateral 04/17/2015   Procedure: BILATERAL BREAST RECONSTRUCTION WITH PLACEMENT OF TISSUE EXPANDERS;  Surgeon: Crissie Reese, MD;  Location: [place];  Service: Plastics;  Laterality: Bilateral;  . KNEE RECONSTRUCTION Right   . LAPAROSCOPIC BILATERAL SALPINGO OOPHERECTOMY Bilateral 07/04/2015   Procedure: LAPAROSCOPIC BILATERAL SALPINGO  OOPHORECTOMY;  Surgeon: Olga Millers, MD;  Location: [place];  Service: Gynecology;  Laterality: Bilateral;  . PORT-A-CATH REMOVAL N/A 08/27/2017   Procedure: MINOR REMOVAL PORT-A-CATH;  Surgeon: Excell Seltzer, MD;  Location: [place];  Service: General;  Laterality: N/A;  . PORTACATH PLACEMENT N/A 11/30/2014   Procedure: INSERTION PORT-A-CATH;  Surgeon: Excell Seltzer, MD;  Location: [place];  Service: General;  Laterality: N/A;  . REMOVAL OF BILATERAL TISSUE EXPANDERS WITH PLACEMENT OF BILATERAL BREAST IMPLANTS Bilateral 07/04/2015   Procedure: REMOVAL OF BILATERAL TISSUE EXPANDERS WITH PLACEMENT OF LEFT AND RIGHT BREAST IMPLANTS FOR DELAYED BREAST RECONSCTUCTION;  Surgeon: Crissie Reese, MD;  Location: [place];  Service: Plastics;  Laterality: Bilateral;  . TONSILECTOMY/ADENOIDECTOMY WITH MYRINGOTOMY    . TONSILLECTOMY    . TOTAL MASTECTOMY Bilateral 04/17/2015   Procedure: BILATERAL TOTAL MASTECTOMY;  Surgeon: Excell Seltzer, MD;  Location: [place];  Service: General;  Laterality: Bilateral;  . TUBAL LIGATION      Social History:  reports that she has never smoked. She has never used smokeless tobacco. She reports that she does not drink alcohol or use drugs.  Allergies: Allergies  Allergen Reactions  . Compazine [Prochlorperazine] Nausea And Vomiting    Makes sickness worse  . Synvisc [Hylan G-F 20]     Causes joint effusion    Family History:  Family History  Problem Relation Age of Onset  . Breast cancer Maternal Aunt        dx in her late  46s  . Breast cancer Paternal Grandmother        dx in her 19s  . Breast cancer Cousin        dx late 12s; BRCA-  . Cancer Cousin        dx in her 66s; leiomyosarcoma     Current Outpatient Medications:  .  escitalopram (LEXAPRO) 5 MG tablet, Take 1 tablet (5 mg total) by mouth daily., Disp: , Rfl:  .  norethindrone-ethinyl estradiol (MICROGESTIN,JUNEL,LOESTRIN) 1-20 MG-MCG tablet, , Disp: ,  Rfl: 0  Review of Systems:  Constitutional: Denies fever, chills, diaphoresis, appetite change and fatigue.  HEENT: Denies photophobia, eye pain, redness, hearing loss, ear pain, congestion, sore throat, rhinorrhea, sneezing, mouth sores, trouble swallowing, neck pain, neck stiffness and tinnitus.   Respiratory: Denies SOB, DOE, cough, chest tightness,  and wheezing.   Cardiovascular: Denies chest pain, palpitations and leg swelling.  Gastrointestinal: Denies nausea, vomiting, abdominal pain, diarrhea, constipation, blood in stool and abdominal distention.  Genitourinary: Denies dysuria, urgency, frequency, hematuria, flank pain and difficulty urinating.  Endocrine: Denies: hot or cold intolerance, sweats, changes in hair or nails, polyuria, polydipsia. Musculoskeletal: Denies myalgias, back pain, joint swelling, arthralgias and gait problem.  Skin: Denies pallor, rash and wound.  Neurological: Denies dizziness, seizures, syncope, weakness, light-headedness, numbness and headaches.  Hematological: Denies adenopathy. Easy bruising, personal or family bleeding history  Psychiatric/Behavioral: Denies suicidal ideation, mood changes, confusion, nervousness, sleep disturbance and agitation    Physical Exam: Vitals:   05/09/19 1022  BP: 130/90  Pulse: 60  Temp: 98.2 F (36.8 C)  TempSrc: Temporal  SpO2: 96%  Weight: 136 lb 14.4 oz (62.1 kg)  Height: 5' 2" (1.575 m)    Body mass index is 25.04 kg/m.   Constitutional: NAD, calm, comfortable Eyes: PERRL, lids and conjunctivae normal ENMT: Mucous membranes are moist.  Respiratory: clear to auscultation bilaterally, no wheezing, no crackles. Normal respiratory effort. No accessory muscle use.  Cardiovascular: Regular rate and rhythm, no murmurs / rubs / gallops. No extremity edema. 2+ pedal pulses.  Neurologic: Grossly intact and nonfocal Psychiatric: Normal judgment and insight. Alert and oriented x 3. Normal mood.    Impression and  Plan:  PALB2-related breast cancer ([place]) -Status post bilateral mastectomy, chemotherapy and Herceptin. -She follows with oncology Dr. Lindi Adie, is in remission.  Postmenopausal symptoms -On Lexapro which has worked well for her.  Postmenopausal HRT (hormone replacement therapy) -On Loestrin prescribed by GYN.      Lelon Frohlich, MD [facility]

## 2019-05-17 MED FILL — LO LOESTRIN FE 1-10 TABLET: 1 MG-10 MCG | 28 days supply | Qty: 28 | Fill #1

## 2019-06-08 DIAGNOSIS — L905 Scar conditions and fibrosis of skin: Secondary | ICD-10-CM | POA: Diagnosis not present

## 2019-06-08 DIAGNOSIS — L700 Acne vulgaris: Secondary | ICD-10-CM | POA: Diagnosis not present

## 2019-06-16 MED FILL — LO LOESTRIN FE 1-10 TABLET: 1 MG-10 MCG | 28 days supply | Qty: 28 | Fill #2

## 2019-06-29 ENCOUNTER — Encounter: Payer: Self-pay | Admitting: Hematology and Oncology

## 2019-06-30 ENCOUNTER — Other Ambulatory Visit: Payer: Self-pay | Admitting: *Deleted

## 2019-06-30 DIAGNOSIS — C50919 Malignant neoplasm of unspecified site of unspecified female breast: Secondary | ICD-10-CM

## 2019-06-30 DIAGNOSIS — Z1509 Genetic susceptibility to other malignant neoplasm: Secondary | ICD-10-CM

## 2019-06-30 DIAGNOSIS — C50111 Malignant neoplasm of central portion of right female breast: Secondary | ICD-10-CM

## 2019-06-30 DIAGNOSIS — Z1589 Genetic susceptibility to other disease: Secondary | ICD-10-CM

## 2019-06-30 NOTE — Progress Notes (Signed)
Received mychart message from pt stating she is experiencing constat headaches that last for several days, unrelieved by OTC medication.  Per MD pt needs to be scheduled for MRI Brain W/WO contrast.  Pt notified and stated she would like some time to think about receiving an MRI vs having a referral to a neurologist.  Pt states she will update the office on her decision within the next few days.

## 2019-07-04 ENCOUNTER — Telehealth: Payer: Self-pay | Admitting: *Deleted

## 2019-07-04 ENCOUNTER — Encounter: Payer: Self-pay | Admitting: Hematology and Oncology

## 2019-07-04 ENCOUNTER — Other Ambulatory Visit: Payer: Self-pay | Admitting: *Deleted

## 2019-07-04 DIAGNOSIS — G8929 Other chronic pain: Secondary | ICD-10-CM

## 2019-07-04 DIAGNOSIS — R519 Headache, unspecified: Secondary | ICD-10-CM

## 2019-07-04 NOTE — Progress Notes (Signed)
Per MD since pt is not experiencing blurry vision, double vision, or dizziness, pt to be referred to [facility] for further assessment of constant headaches.  Referral placed.

## 2019-07-04 NOTE — Telephone Encounter (Signed)
3rd attempt to call pt, no answer, LVM

## 2019-07-04 NOTE — Telephone Encounter (Signed)
Attempt x2 to call pt regarding neurology referral VS brain MRI.  No answer, LVM for pt to return call to the office.

## 2019-07-12 MED FILL — LO LOESTRIN FE 1-10 TABLET: 1 MG-10 MCG | 28 days supply | Qty: 28 | Fill #3

## 2019-07-28 ENCOUNTER — Encounter: Payer: BC Managed Care – PPO | Admitting: Internal Medicine

## 2019-08-07 MED FILL — LO LOESTRIN FE 1-10 TABLET: 1 MG-10 MCG | 28 days supply | Qty: 28 | Fill #4

## 2019-08-21 NOTE — Progress Notes (Signed)
Virtual Visit via Video Note The purpose of this virtual visit is to provide medical care while limiting exposure to the novel coronavirus.    Consent was obtained for video visit:  Yes.   Answered questions that patient had about telehealth interaction:  Yes.   I discussed the limitations, risks, security and privacy concerns of performing an evaluation and management service by telemedicine. I also discussed with the patient that there may be a patient responsible charge related to this service. The patient expressed understanding and agreed to proceed.  Pt location: Home Physician Location: office Name of referring provider:  Nicholas Lose, MD I connected with Saidee Quynn Vilchis at patients initiation/request on 08/23/2019 at  9:10 AM EST by video enabled telemedicine application and verified that I am speaking with the correct person using two identifiers. Pt MRN:  [id number] Pt DOB:  [birth year] Video Participants:  Shauntay Dorian Pod   History of Present Illness:  Robin Miles is a 44 year old female with history of breast cancer status post bilateral mastectomies and chemotherapy who presents for headaches.  History supplemented by referring provider note.  Onset:  She has had headaches infrequently for several years.  They have been more consistent since October.   Location:  Over and around left eye Quality:  pressure Intensity:  Usually dull. Denies thunderclap headache Aura:  no Premonitory Phase:  no Postdrome:  no Associated symptoms:  Photophobia.  On 2 occasions, she has had nausea and vomiting with severe headache.  She denies associated phonophobia, visual disturbance, unilateral numbness or weakness. Duration:  4-5 days.  She may wake up with them. Frequency:  They may occur persistently for 4-5 days and then headache-free for a couple of days but then returns. Frequency of abortive medication: 2 days a week (usually Tylenol, sometimes ibuprofen) Triggers:  Unsure if  stress-related or dehydration.  Possibly when wearing her reading glasses. Relieving factors:  Not really.   Activity:  Not aggravates  She has history of ocular migraines since after having her first child, presenting as spinning lights in both lights with peripheral vision loss lasting 10-15 minutes followed by a dull headache.   They usually occur 3 to 4 times a year.  Current NSAIDS:  ibuprofen Current analgesics:  Tylenol Current triptans:  none Current ergotamine:  none Current anti-emetic:  none Current muscle relaxants:  none Current anti-anxiolytic:  none Current sleep aide:  none Current Antihypertensive medications:  none Current Antidepressant medications:  Lexapro 28m daily Current Anticonvulsant medications:  none Current anti-CGRP:  none Current Vitamins/Herbal/Supplements:  none Current Antihistamines/Decongestants:  none Other therapy:  none Hormone/birth control:  Norethindrone-ethinyl estradiol (for hormone replacement therapy)  Past NSAIDS:  none Past analgesics:  Generic Excedrin Migraine Past abortive triptans:  none Past abortive ergotamine:  none Past muscle relaxants:  Robaxin Past anti-emetic:  Zofran, Phenergan Past antihypertensive medications:  none Past antidepressant medications:  Venlafaxine XR 37.542mtwice daily (side effects) Past anticonvulsant medications:  none Past anti-CGRP:  none Past vitamins/Herbal/Supplements:  none Past antihistamines/decongestants:  none Other past therapies:  none  Caffeine:  Does not drink much caffeine Diet:  Tries to keep hydrated (at least 8 cups a day).  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 Other pain:  no Sleep hygiene:  Overall okay. Family history of headache:  Twin sister has headaches   Past Medical History: Past Medical History:  Diagnosis Date  . Breast  cancer ([place]) 2016   ER-/PR-/Her2+  . Breast cancer, right breast  ([place])   . Complication of anesthesia    takes patient a long time to wake up after anesthesia  . Complication of anesthesia    "blurry vision for 3-4 days after anesthesia"  . Family history of breast cancer   . History of bronchitis   . History of chemotherapy   . Osteoarthritis of both knees   . PALB2-related breast cancer ([place])   . PONV (postoperative nausea and vomiting)     Medications: Outpatient Encounter Medications as of 08/23/2019  Medication Sig  . escitalopram (LEXAPRO) 5 MG tablet Take 1 tablet (5 mg total) by mouth daily.  . norethindrone-ethinyl estradiol (MICROGESTIN,JUNEL,LOESTRIN) 1-20 MG-MCG tablet    No facility-administered encounter medications on file as of 08/23/2019.    Allergies: Allergies  Allergen Reactions  . Compazine [Prochlorperazine] Nausea And Vomiting    Makes sickness worse  . Synvisc [Hylan G-F 20]     Causes joint effusion    Family History: Family History  Problem Relation Age of Onset  . Breast cancer Maternal Aunt        dx in her late 81s  . Breast cancer Paternal Grandmother        dx in her 6s  . Breast cancer Cousin        dx late 73s; BRCA-  . Cancer Cousin        dx in her 53s; leiomyosarcoma    Social History: Social History   Socioeconomic History  . Marital status: Married    Spouse name: Not on file  . Number of children: 6  . Years of education: Not on file  . Highest education level: Not on file  Occupational History  . Occupation: Therapist, sports at [employer] Use  . Smoking status: Never Smoker  . Smokeless tobacco: Never Used  Substance and Sexual Activity  . Alcohol use: No    Alcohol/week: 0.0 standard drinks  . Drug use: No  . Sexual activity: Yes    Birth control/protection: Surgical  Other Topics Concern  . Not on file  Social History Narrative  . Not on file   Social Determinants of Health   Financial Resource Strain:   . Difficulty of Paying Living Expenses: Not on file  Food  Insecurity:   . Worried About Charity fundraiser in the Last Year: Not on file  . Ran Out of Food in the Last Year: Not on file  Transportation Needs:   . Lack of Transportation (Medical): Not on file  . Lack of Transportation (Non-Medical): Not on file  Physical Activity:   . Days of Exercise per Week: Not on file  . Minutes of Exercise per Session: Not on file  Stress:   . Feeling of Stress : Not on file  Social Connections:   . Frequency of Communication with Friends and Family: Not on file  . Frequency of Social Gatherings with Friends and Family: Not on file  . Attends Religious Services: Not on file  . Active Member of Clubs or Organizations: Not on file  . Attends Archivist Meetings: Not on file  . Marital Status: Not on file  Intimate Partner Violence:   . Fear of Current or Ex-Partner: Not on file  . Emotionally Abused: Not on file  . Physically Abused: Not on file  . Sexually Abused: Not on file    Observations/Objective:   Height '5\' 2"'$  (1.575 m),  weight 133 lb (60.3 kg). No acute distress.  Alert and oriented.  Speech fluent and not dysarthric.  Language intact.  Eyes orthophoric on primary gaze.  Face symmetric.  Assessment and Plan:   1.  Worsening headaches, intractable chronic tension type headaches vs migraine.  Given that these are worsening headaches that wake her up, further evaluation warranted. 2.  Ocular migraine, stable 3.  History of breast cancer  1. MRI of brain with and without contrast and sed rate  2. For preventative management, start Trokendi XR 62m at bedtime.  We can increase dose to 517mat bedtime in 4 weeks if needed.  Will mail her a savings card 3.  For abortive therapy, naproxen 50013m.  Limit use of pain relievers to no more than 2 days out of week to prevent risk of rebound or medication-overuse headache. 5.  Keep headache diary 6.  Exercise, hydration, caffeine cessation, sleep hygiene, monitor for and avoid triggers 7.  Advised to have her vision rechecked.  8. Follow up 4 months.   Follow Up Instructions:    -I discussed the assessment and treatment plan with the patient. The patient was provided an opportunity to ask questions and all were answered. The patient agreed with the plan and demonstrated an understanding of the instructions.   The patient was advised to call back or seek an in-person evaluation if the symptoms worsen or if the condition fails to improve as anticipated.    AdaDudley MajorO

## 2019-08-22 ENCOUNTER — Encounter: Payer: Self-pay | Admitting: Neurology

## 2019-08-23 ENCOUNTER — Encounter: Payer: Self-pay | Admitting: *Deleted

## 2019-08-23 ENCOUNTER — Encounter: Payer: Self-pay | Admitting: Neurology

## 2019-08-23 ENCOUNTER — Other Ambulatory Visit: Payer: Self-pay

## 2019-08-23 ENCOUNTER — Telehealth (INDEPENDENT_AMBULATORY_CARE_PROVIDER_SITE_OTHER): Payer: BC Managed Care – PPO | Admitting: Neurology

## 2019-08-23 VITALS — Ht 62.0 in | Wt 133.0 lb

## 2019-08-23 DIAGNOSIS — R519 Headache, unspecified: Secondary | ICD-10-CM

## 2019-08-23 DIAGNOSIS — G43109 Migraine with aura, not intractable, without status migrainosus: Secondary | ICD-10-CM

## 2019-08-23 DIAGNOSIS — Z853 Personal history of malignant neoplasm of breast: Secondary | ICD-10-CM

## 2019-08-23 MED ORDER — TROKENDI XR 25 MG PO CP24: 25.0000 mg | ORAL_CAPSULE | Freq: Every day | ORAL | 11 refills | Status: DC

## 2019-08-23 MED ORDER — NAPROXEN 500 MG PO TABS: 500.0000 mg | ORAL_TABLET | Freq: Two times a day (BID) | ORAL | 5 refills | Status: AC | PRN

## 2019-08-23 MED FILL — NAPROXEN 500 MG TABS: 500 | 10 days supply | Qty: 20 | Fill #0

## 2019-08-23 NOTE — Progress Notes (Addendum)
Robin Miles: [id number] help? Call us at [phone] Status Sent to [facility] Trokendi XR 25MG  er capsules Form [employer] Electronic PA Form [phone] NCPDP)  Insurnace requested try Topiramate 25mg  instead  Robin Miles: [id number] - PA Case ID: [id number] Need help? Call us at [phone] Outcome N/[place] No Prior Authorization is required at this time based on the alternative drug you chose to prescribe.;CaseId:[id number];Status:Cancelled; Drug Trokendi XR 25MG  er capsules Form [employer] Electronic PA Form [phone] NCPDP)

## 2019-08-30 NOTE — Progress Notes (Signed)
[facility] called and said there are no Trokendi XR equivalents that are generic. I resubmitted the PA  Annabell Gene (Key: [id number])  [employer] is reviewing your PA request and will respond within 24 hours for Medicaid or up to 72 hours for non-Medicaid plans, based on the required timeframe determined by state or federal regulations. To check for an update later, open this request from your dashboard.

## 2019-08-31 ENCOUNTER — Ambulatory Visit (INDEPENDENT_AMBULATORY_CARE_PROVIDER_SITE_OTHER): Payer: BC Managed Care – PPO | Admitting: Internal Medicine

## 2019-08-31 ENCOUNTER — Other Ambulatory Visit: Payer: Self-pay

## 2019-08-31 ENCOUNTER — Other Ambulatory Visit: Payer: Self-pay | Admitting: Internal Medicine

## 2019-08-31 ENCOUNTER — Encounter: Payer: Self-pay | Admitting: Internal Medicine

## 2019-08-31 VITALS — BP 120/84 | HR 67 | Temp 97.3°F | Ht 62.25 in | Wt 135.0 lb

## 2019-08-31 DIAGNOSIS — Z1589 Genetic susceptibility to other disease: Secondary | ICD-10-CM

## 2019-08-31 DIAGNOSIS — C50919 Malignant neoplasm of unspecified site of unspecified female breast: Secondary | ICD-10-CM | POA: Diagnosis not present

## 2019-08-31 DIAGNOSIS — Z0000 Encounter for general adult medical examination without abnormal findings: Secondary | ICD-10-CM

## 2019-08-31 DIAGNOSIS — Z1211 Encounter for screening for malignant neoplasm of colon: Secondary | ICD-10-CM | POA: Diagnosis not present

## 2019-08-31 DIAGNOSIS — Z1502 Genetic susceptibility to malignant neoplasm of ovary: Secondary | ICD-10-CM

## 2019-08-31 DIAGNOSIS — H6122 Impacted cerumen, left ear: Secondary | ICD-10-CM | POA: Diagnosis not present

## 2019-08-31 DIAGNOSIS — E785 Hyperlipidemia, unspecified: Secondary | ICD-10-CM | POA: Insufficient documentation

## 2019-08-31 DIAGNOSIS — Z1509 Genetic susceptibility to other malignant neoplasm: Secondary | ICD-10-CM

## 2019-08-31 LAB — CBC WITH DIFFERENTIAL/PLATELET
Basophils Absolute: 0 10*3/uL (ref 0.0–0.1)
Basophils Relative: 0.7 % (ref 0.0–3.0)
Eosinophils Absolute: 0.2 10*3/uL (ref 0.0–0.7)
Eosinophils Relative: 2.4 % (ref 0.0–5.0)
HCT: 44.8 % (ref 36.0–46.0)
Hemoglobin: 15.1 g/dL — ABNORMAL HIGH (ref 12.0–15.0)
Lymphocytes Relative: 34.5 % (ref 12.0–46.0)
Lymphs Abs: 2.3 10*3/uL (ref 0.7–4.0)
MCHC: 33.6 g/dL (ref 30.0–36.0)
MCV: 88.1 fl (ref 78.0–100.0)
Monocytes Absolute: 0.3 10*3/uL (ref 0.1–1.0)
Monocytes Relative: 4 % (ref 3.0–12.0)
Neutro Abs: 3.8 10*3/uL (ref 1.4–7.7)
Neutrophils Relative %: 58.4 % (ref 43.0–77.0)
Platelets: 302 10*3/uL (ref 150.0–400.0)
RBC: 5.08 Mil/uL (ref 3.87–5.11)
RDW: 12.7 % (ref 11.5–15.5)
WBC: 6.6 10*3/uL (ref 4.0–10.5)

## 2019-08-31 LAB — TSH: TSH: 2.14 u[IU]/mL (ref 0.35–4.50)

## 2019-08-31 LAB — COMPREHENSIVE METABOLIC PANEL
ALT: 15 U/L (ref 0–35)
AST: 16 U/L (ref 0–37)
Albumin: 4.5 g/dL (ref 3.5–5.2)
Alkaline Phosphatase: 50 U/L (ref 39–117)
BUN: 13 mg/dL (ref 6–23)
CO2: 28 mEq/L (ref 19–32)
Calcium: 9.8 mg/dL (ref 8.4–10.5)
Chloride: 104 mEq/L (ref 96–112)
Creatinine, Ser: 1.02 mg/dL (ref 0.40–1.20)
GFR: 58.5 mL/min — ABNORMAL LOW (ref >60.00)
Glucose, Bld: 97 mg/dL (ref 70–99)
Potassium: 4.1 mEq/L (ref 3.5–5.1)
Sodium: 140 mEq/L (ref 135–145)
Total Bilirubin: 0.9 mg/dL (ref 0.2–1.2)
Total Protein: 7 g/dL (ref 6.0–8.3)

## 2019-08-31 LAB — LIPID PANEL
Cholesterol: 289 mg/dL — ABNORMAL HIGH (ref 0–200)
HDL: 77.5 mg/dL (ref >39.00)
LDL Cholesterol: 187 mg/dL — ABNORMAL HIGH (ref 0–99)
NonHDL: 211.51
Total CHOL/HDL Ratio: 4
Triglycerides: 123 mg/dL (ref 0.0–149.0)
VLDL: 24.6 mg/dL (ref 0.0–40.0)

## 2019-08-31 LAB — VITAMIN B12: Vitamin B-12: 224 pg/mL (ref 211–911)

## 2019-08-31 LAB — VITAMIN D 25 HYDROXY (VIT D DEFICIENCY, FRACTURES): VITD: 57.02 ng/mL (ref 30.00–100.00)

## 2019-08-31 LAB — HEMOGLOBIN A1C: Hgb A1c MFr Bld: 5.4 % (ref 4.6–6.5)

## 2019-08-31 MED ORDER — ATORVASTATIN CALCIUM 10 MG PO TABS: 10.0000 mg | ORAL_TABLET | Freq: Every day | ORAL | 1 refills | Status: DC

## 2019-08-31 MED ORDER — ESCITALOPRAM OXALATE 5 MG PO TABS: 5.0000 mg | ORAL_TABLET | Freq: Every day | ORAL | 1 refills | Status: DC

## 2019-08-31 MED FILL — LO LOESTRIN FE 1-10 TABLET: 1 MG-10 MCG | 28 days supply | Qty: 28 | Fill #5

## 2019-08-31 MED FILL — ATORVASTATIN 10 MG TABLET: 10 | 30 days supply | Qty: 30 | Fill #0

## 2019-08-31 MED FILL — ESCITALOPRAM 5 MG TABLET: 5 | 30 days supply | Qty: 30 | Fill #0

## 2019-08-31 NOTE — Patient Instructions (Signed)
-Nice seeing you today!!  -Lab work today; will notify you once results are available.  -GI referral today.  -See you back in 1 year or sooner as needed.   Preventive Care 11-46 Years Old, Female Preventive care refers to visits with your health care provider and lifestyle choices that can promote health and wellness. This includes:  A yearly physical exam. This may also be called an annual well check.  Regular dental visits and eye exams.  Immunizations.  Screening for certain conditions.  Healthy lifestyle choices, such as eating a healthy diet, getting regular exercise, not using drugs or products that contain nicotine and tobacco, and limiting alcohol use. What can I expect for my preventive care visit? Physical exam Your health care provider will check your:  Height and weight. This may be used to calculate body mass index (BMI), which tells if you are at a healthy weight.  Heart rate and blood pressure.  Skin for abnormal spots. Counseling Your health care provider may ask you questions about your:  Alcohol, tobacco, and drug use.  Emotional well-being.  Home and relationship well-being.  Sexual activity.  Eating habits.  Work and work Statistician.  Method of birth control.  Menstrual cycle.  Pregnancy history. What immunizations do I need?  Influenza (flu) vaccine  This is recommended every year. Tetanus, diphtheria, and pertussis (Tdap) vaccine  You may need a Td booster every 10 years. Varicella (chickenpox) vaccine  You may need this if you have not been vaccinated. Zoster (shingles) vaccine  You may need this after age 50. Measles, mumps, and rubella (MMR) vaccine  You may need at least one dose of MMR if you were born in 1957 or later. You may also need a second dose. Pneumococcal conjugate (PCV13) vaccine  You may need this if you have certain conditions and were not previously vaccinated. Pneumococcal polysaccharide (PPSV23)  vaccine  You may need one or two doses if you smoke cigarettes or if you have certain conditions. Meningococcal conjugate (MenACWY) vaccine  You may need this if you have certain conditions. Hepatitis A vaccine  You may need this if you have certain conditions or if you travel or work in places where you may be exposed to hepatitis A. Hepatitis B vaccine  You may need this if you have certain conditions or if you travel or work in places where you may be exposed to hepatitis B. Haemophilus influenzae type b (Hib) vaccine  You may need this if you have certain conditions. Human papillomavirus (HPV) vaccine  If recommended by your health care provider, you may need three doses over 6 months. You may receive vaccines as individual doses or as more than one vaccine together in one shot (combination vaccines). Talk with your health care provider about the risks and benefits of combination vaccines. What tests do I need? Blood tests  Lipid and cholesterol levels. These may be checked every 5 years, or more frequently if you are over 36 years old.  Hepatitis C test.  Hepatitis B test. Screening  Lung cancer screening. You may have this screening every year starting at age 48 if you have a 30-pack-year history of smoking and currently smoke or have quit within the past 15 years.  Colorectal cancer screening. All adults should have this screening starting at age 65 and continuing until age 51. Your health care provider may recommend screening at age 47 if you are at increased risk. You will have tests every 1-10 years, depending on your  results and the type of screening test.  Diabetes screening. This is done by checking your blood sugar (glucose) after you have not eaten for a while (fasting). You may have this done every 1-3 years.  Mammogram. This may be done every 1-2 years. Talk with your health care provider about when you should start having regular mammograms. This may depend on  whether you have a family history of breast cancer.  BRCA-related cancer screening. This may be done if you have a family history of breast, ovarian, tubal, or peritoneal cancers.  Pelvic exam and Pap test. This may be done every 3 years starting at age 64. Starting at age 73, this may be done every 5 years if you have a Pap test in combination with an HPV test. Other tests  Sexually transmitted disease (STD) testing.  Bone density scan. This is done to screen for osteoporosis. You may have this scan if you are at high risk for osteoporosis. Follow these instructions at home: Eating and drinking  Eat a diet that includes fresh fruits and vegetables, whole grains, lean protein, and low-fat dairy.  Take vitamin and mineral supplements as recommended by your health care provider.  Do not drink alcohol if: ? Your health care provider tells you not to drink. ? You are pregnant, may be pregnant, or are planning to become pregnant.  If you drink alcohol: ? Limit how much you have to 0-1 drink a day. ? Be aware of how much alcohol is in your drink. In the [place] one drink equals one 12 oz bottle of beer (355 mL), one 5 oz glass of wine (148 mL), or one 1 oz glass of hard liquor (44 mL). Lifestyle  Take daily care of your teeth and gums.  Stay active. Exercise for at least 30 minutes on 5 or more days each week.  Do not use any products that contain nicotine or tobacco, such as cigarettes, e-cigarettes, and chewing tobacco. If you need help quitting, ask your health care provider.  If you are sexually active, practice safe sex. Use a condom or other form of birth control (contraception) in order to prevent pregnancy and STIs (sexually transmitted infections).  If told by your health care provider, take low-dose aspirin daily starting at age 90. What's next?  Visit your health care provider once a year for a well check visit.  Ask your health care provider how often you should have your  eyes and teeth checked.  Stay up to date on all vaccines. This information is not intended to replace advice given to you by your health care provider. Make sure you discuss any questions you have with your health care provider. Document Revised: 03/17/2018 Document Reviewed: 03/17/2018 Elsevier Patient Education  2020 [employer].

## 2019-08-31 NOTE — Addendum Note (Signed)
Addended by: Westley Hummer B on: 08/31/2019 10:37 AM   Modules accepted: Orders

## 2019-08-31 NOTE — Progress Notes (Signed)
Established Patient Office Visit     This visit occurred during the SARS-CoV-2 public health emergency.  Safety protocols were in place, including screening questions prior to the visit, additional usage of staff PPE, and extensive cleaning of exam room while observing appropriate contact time as indicated for disinfecting solutions.    CC/Reason for Visit: Annual preventive exam  HPI: Robin Miles is a 44 y.o. female who is coming in today for the above mentioned reasons. 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  She has been maintained on postnatal hormone replacement therapy as well as Lexapro for severe hot flashes.  She has no acute complaints today.  She has routine eye and dental care.  She completed her Covid vaccination series.   Past Medical/Surgical History: Past Medical History:  Diagnosis Date  . Breast cancer ([place]) 2016   ER-/PR-/Her2+  . Breast cancer, right breast ([place])   . Complication of anesthesia    takes patient a long time to wake up after anesthesia  . Complication of anesthesia    "blurry vision for 3-4 days after anesthesia"  . Family history of breast cancer   . History of bronchitis   . History of chemotherapy   . Osteoarthritis of both knees   . PALB2-related breast cancer ([place])   . PONV (postoperative nausea and vomiting)     Past Surgical History:  Procedure Laterality Date  . BREAST RECONSTRUCTION WITH PLACEMENT OF TISSUE EXPANDER AND FLEX HD (ACELLULAR HYDRATED DERMIS) Bilateral 04/17/2015   Procedure: BILATERAL BREAST RECONSTRUCTION WITH PLACEMENT OF TISSUE EXPANDERS;  Surgeon: Crissie Reese, MD;  Location: [facility];  Service: Plastics;  Laterality: Bilateral;  . KNEE RECONSTRUCTION Right   . LAPAROSCOPIC BILATERAL SALPINGO OOPHERECTOMY Bilateral 07/04/2015   Procedure: LAPAROSCOPIC BILATERAL  SALPINGO OOPHORECTOMY;  Surgeon: Olga Millers, MD;  Location: [facility];  Service: Gynecology;  Laterality: Bilateral;  . PORT-A-CATH REMOVAL N/A 08/27/2017   Procedure: MINOR REMOVAL PORT-A-CATH;  Surgeon: Excell Seltzer, MD;  Location: [facility];  Service: General;  Laterality: N/A;  . PORTACATH PLACEMENT N/A 11/30/2014   Procedure: INSERTION PORT-A-CATH;  Surgeon: Excell Seltzer, MD;  Location: [facility];  Service: General;  Laterality: N/A;  . REMOVAL OF BILATERAL TISSUE EXPANDERS WITH PLACEMENT OF BILATERAL BREAST IMPLANTS Bilateral 07/04/2015   Procedure: REMOVAL OF BILATERAL TISSUE EXPANDERS WITH PLACEMENT OF LEFT AND RIGHT BREAST IMPLANTS FOR DELAYED BREAST RECONSCTUCTION;  Surgeon: Crissie Reese, MD;  Location: [facility];  Service: Plastics;  Laterality: Bilateral;  . TONSILECTOMY/ADENOIDECTOMY WITH MYRINGOTOMY    . TONSILLECTOMY    . TOTAL MASTECTOMY Bilateral 04/17/2015   Procedure: BILATERAL TOTAL MASTECTOMY;  Surgeon: Excell Seltzer, MD;  Location: [facility];  Service: General;  Laterality: Bilateral;  . TUBAL LIGATION      Social History:  reports that she has never smoked. She has never used smokeless tobacco. She reports that she does not drink alcohol or use drugs.  Allergies: Allergies  Allergen Reactions  . Compazine [Prochlorperazine] Nausea And Vomiting    Makes sickness worse  . Synvisc [Hylan G-F 20]     Causes joint effusion    Family History:  Family History  Problem Relation Age of Onset  . Breast cancer Maternal Aunt        dx in her late 44s  . Breast cancer Paternal Grandmother        dx  in her 61s  . Breast cancer Cousin        dx late 27s; BRCA-  . Cancer Cousin        dx in her 34s; leiomyosarcoma     Current Outpatient Medications:  .  escitalopram (LEXAPRO) 5 MG tablet, Take 1 tablet (5 mg total) by mouth daily., Disp: 90 tablet, Rfl: 1 .  naproxen (NAPROSYN) 500 MG tablet, Take 1 tablet (500 mg total) by mouth 2  (two) times daily as needed., Disp: 20 tablet, Rfl: 5 .  norethindrone-ethinyl estradiol (MICROGESTIN,JUNEL,LOESTRIN) 1-20 MG-MCG tablet, , Disp: , Rfl: 0 .  Topiramate ER (TROKENDI XR) 25 MG CP24, Take 1 capsule (25 mg total) by mouth at bedtime., Disp: 30 capsule, Rfl: 11  Review of Systems:  Constitutional: Denies fever, chills, diaphoresis, appetite change and fatigue.  HEENT: Denies photophobia, eye pain, redness, hearing loss, ear pain, congestion, sore throat, rhinorrhea, sneezing, mouth sores, trouble swallowing, neck pain, neck stiffness and tinnitus.   Respiratory: Denies SOB, DOE, cough, chest tightness,  and wheezing.   Cardiovascular: Denies chest pain, palpitations and leg swelling.  Gastrointestinal: Denies nausea, vomiting, abdominal pain, diarrhea, constipation, blood in stool and abdominal distention.  Genitourinary: Denies dysuria, urgency, frequency, hematuria, flank pain and difficulty urinating.  Endocrine: Denies: hot or cold intolerance, sweats, changes in hair or nails, polyuria, polydipsia. Musculoskeletal: Denies myalgias, back pain, joint swelling, arthralgias and gait problem.  Skin: Denies pallor, rash and wound.  Neurological: Denies dizziness, seizures, syncope, weakness, light-headedness, numbness and headaches.  Hematological: Denies adenopathy. Easy bruising, personal or family bleeding history  Psychiatric/Behavioral: Denies suicidal ideation, mood changes, confusion, nervousness, sleep disturbance and agitation    Physical Exam: Vitals:   08/31/19 1003  BP: 120/84  Pulse: 67  Temp: (!) 97.3 F (36.3 C)  TempSrc: Temporal  SpO2: 97%  Weight: 135 lb (61.2 kg)  Height: 5' 2.25" (1.581 m)    Body mass index is 24.49 kg/m.   Constitutional: NAD, calm, comfortable Eyes: PERRL, lids and conjunctivae normal ENMT: Mucous membranes are moist. Posterior pharynx clear of any exudate or lesions. Normal dentition. Tympanic membrane is pearly white, no  erythema or bulging on the right, left is obstructed with cerumen Neck: normal, supple, no masses, no thyromegaly Respiratory: clear to auscultation bilaterally, no wheezing, no crackles. Normal respiratory effort. No accessory muscle use.  Cardiovascular: Regular rate and rhythm, no murmurs / rubs / gallops. No extremity edema. 2+ pedal pulses. No carotid bruits.  Abdomen: no tenderness, no masses palpated. No hepatosplenomegaly. Bowel sounds positive.  Musculoskeletal: no clubbing / cyanosis. No joint deformity upper and lower extremities. Good ROM, no contractures. Normal muscle tone.  Skin: no rashes, lesions, ulcers. No induration Neurologic: CN 2-12 grossly intact. Sensation intact, DTR normal. Strength 5/5 in all 4.  Psychiatric: Normal judgment and insight. Alert and oriented x 3. Normal mood.    Impression and Plan:  Encounter for preventive health examination -Advised routine eye and dental care. -Immunizations are up-to-date and age-appropriate. -Screening labs today. -Healthy lifestyle has been discussed in detail. -Send to GI for initial screening colonoscopy as she is now 59. -She follows with GYN for Pap smears, she has been told she no longer needs mammograms.   Left ear impacted cerumen -Cerumen Desimpaction  After obtaining patient consent, warm water was applied and gentle ear lavage performed on left ear. There were no complications and following the desimpaction the tympanic membranes were visible. Tympanic membranes are intact following the procedure. Auditory canals are  normal. The patient reported relief of symptoms after removal of cerumen.   PALB2-related breast cancer ([place])  - Plan: escitalopram (LEXAPRO) 5 MG tablet -Follow-up with oncology as scheduled.   Patient Instructions  -Nice seeing you today!!  -Lab work today; will notify you once results are available.  -GI referral today.  -See you back in 1 year or sooner as needed.   Preventive Care  18-46 Years Old, Female Preventive care refers to visits with your health care provider and lifestyle choices that can promote health and wellness. This includes:  A yearly physical exam. This may also be called an annual well check.  Regular dental visits and eye exams.  Immunizations.  Screening for certain conditions.  Healthy lifestyle choices, such as eating a healthy diet, getting regular exercise, not using drugs or products that contain nicotine and tobacco, and limiting alcohol use. What can I expect for my preventive care visit? Physical exam Your health care provider will check your:  Height and weight. This may be used to calculate body mass index (BMI), which tells if you are at a healthy weight.  Heart rate and blood pressure.  Skin for abnormal spots. Counseling Your health care provider may ask you questions about your:  Alcohol, tobacco, and drug use.  Emotional well-being.  Home and relationship well-being.  Sexual activity.  Eating habits.  Work and work Statistician.  Method of birth control.  Menstrual cycle.  Pregnancy history. What immunizations do I need?  Influenza (flu) vaccine  This is recommended every year. Tetanus, diphtheria, and pertussis (Tdap) vaccine  You may need a Td booster every 10 years. Varicella (chickenpox) vaccine  You may need this if you have not been vaccinated. Zoster (shingles) vaccine  You may need this after age 42. Measles, mumps, and rubella (MMR) vaccine  You may need at least one dose of MMR if you were born in 1957 or later. You may also need a second dose. Pneumococcal conjugate (PCV13) vaccine  You may need this if you have certain conditions and were not previously vaccinated. Pneumococcal polysaccharide (PPSV23) vaccine  You may need one or two doses if you smoke cigarettes or if you have certain conditions. Meningococcal conjugate (MenACWY) vaccine  You may need this if you have certain  conditions. Hepatitis A vaccine  You may need this if you have certain conditions or if you travel or work in places where you may be exposed to hepatitis A. Hepatitis B vaccine  You may need this if you have certain conditions or if you travel or work in places where you may be exposed to hepatitis B. Haemophilus influenzae type b (Hib) vaccine  You may need this if you have certain conditions. Human papillomavirus (HPV) vaccine  If recommended by your health care provider, you may need three doses over 6 months. You may receive vaccines as individual doses or as more than one vaccine together in one shot (combination vaccines). Talk with your health care provider about the risks and benefits of combination vaccines. What tests do I need? Blood tests  Lipid and cholesterol levels. These may be checked every 5 years, or more frequently if you are over 66 years old.  Hepatitis C test.  Hepatitis B test. Screening  Lung cancer screening. You may have this screening every year starting at age 66 if you have a 30-pack-year history of smoking and currently smoke or have quit within the past 15 years.  Colorectal cancer screening. All adults should have this  screening starting at age 26 and continuing until age 58. Your health care provider may recommend screening at age 68 if you are at increased risk. You will have tests every 1-10 years, depending on your results and the type of screening test.  Diabetes screening. This is done by checking your blood sugar (glucose) after you have not eaten for a while (fasting). You may have this done every 1-3 years.  Mammogram. This may be done every 1-2 years. Talk with your health care provider about when you should start having regular mammograms. This may depend on whether you have a family history of breast cancer.  BRCA-related cancer screening. This may be done if you have a family history of breast, ovarian, tubal, or peritoneal cancers.   Pelvic exam and Pap test. This may be done every 3 years starting at age 17. Starting at age 55, this may be done every 5 years if you have a Pap test in combination with an HPV test. Other tests  Sexually transmitted disease (STD) testing.  Bone density scan. This is done to screen for osteoporosis. You may have this scan if you are at high risk for osteoporosis. Follow these instructions at home: Eating and drinking  Eat a diet that includes fresh fruits and vegetables, whole grains, lean protein, and low-fat dairy.  Take vitamin and mineral supplements as recommended by your health care provider.  Do not drink alcohol if: ? Your health care provider tells you not to drink. ? You are pregnant, may be pregnant, or are planning to become pregnant.  If you drink alcohol: ? Limit how much you have to 0-1 drink a day. ? Be aware of how much alcohol is in your drink. In the [place] one drink equals one 12 oz bottle of beer (355 mL), one 5 oz glass of wine (148 mL), or one 1 oz glass of hard liquor (44 mL). Lifestyle  Take daily care of your teeth and gums.  Stay active. Exercise for at least 30 minutes on 5 or more days each week.  Do not use any products that contain nicotine or tobacco, such as cigarettes, e-cigarettes, and chewing tobacco. If you need help quitting, ask your health care provider.  If you are sexually active, practice safe sex. Use a condom or other form of birth control (contraception) in order to prevent pregnancy and STIs (sexually transmitted infections).  If told by your health care provider, take low-dose aspirin daily starting at age 47. What's next?  Visit your health care provider once a year for a well check visit.  Ask your health care provider how often you should have your eyes and teeth checked.  Stay up to date on all vaccines. This information is not intended to replace advice given to you by your health care provider. Make sure you discuss any  questions you have with your health care provider. Document Revised: 03/17/2018 Document Reviewed: 03/17/2018 Elsevier Patient Education  2020 [place], [place] [facility]

## 2019-08-31 NOTE — Addendum Note (Signed)
Addended by: Suzette Battiest on: 08/31/2019 10:52 AM   Modules accepted: Orders

## 2019-09-18 NOTE — Progress Notes (Signed)
This was denied even though there is no generic equivalent. Dr. Tomi Likens spoke with her and she will try immediate release topiramate and then in the future we can try again if this fails.

## 2019-09-28 MED FILL — LO LOESTRIN FE 1-10 TABLET: 1 MG-10 MCG | 28 days supply | Qty: 28 | Fill #6

## 2019-09-30 ENCOUNTER — Other Ambulatory Visit: Payer: BC Managed Care – PPO

## 2019-10-06 MED FILL — ESCITALOPRAM 5 MG TABLET: 5 | 30 days supply | Qty: 30 | Fill #1

## 2019-10-06 MED FILL — ATORVASTATIN 10 MG TABLET: 10 | 30 days supply | Qty: 30 | Fill #1

## 2019-10-13 DIAGNOSIS — H524 Presbyopia: Secondary | ICD-10-CM | POA: Diagnosis not present

## 2019-10-23 ENCOUNTER — Telehealth: Payer: Self-pay | Admitting: Internal Medicine

## 2019-10-23 NOTE — Telephone Encounter (Signed)
Pt would like a call back to discuss her cholesterol medication. She would like to know if there is a alternative.

## 2019-10-24 MED ORDER — ROSUVASTATIN CALCIUM 5 MG PO TABS: 5.0000 mg | ORAL_TABLET | Freq: Every day | ORAL | 1 refills | Status: DC

## 2019-10-24 MED FILL — ROSUVASTATIN CALCIUM 5 MG T: 5 | 30 days supply | Qty: 30 | Fill #0

## 2019-10-24 NOTE — Telephone Encounter (Signed)
Patient is complaining of abdominal pain with burning, and horrible heartburn.  Patient has tried eliminating foods from her diet, but the only thing that help is stopping her Lipitor. She would like to know if she can try a different medication, or is it beneficial for her to take Lipitor every other day or every 3 days?  Okay to leave reply on MyChart

## 2019-10-24 NOTE — Telephone Encounter (Signed)
Not convinced that Lipitor is the culprit here. Is she is adamant on switching, could try Crestor 5 mg at bedtime.

## 2019-10-24 NOTE — Telephone Encounter (Signed)
Left message on machine returning patient's call.  Need more information as to why she would like to change her cholesterol medication.

## 2019-10-26 DIAGNOSIS — R5383 Other fatigue: Secondary | ICD-10-CM | POA: Diagnosis not present

## 2019-10-26 DIAGNOSIS — R5382 Chronic fatigue, unspecified: Secondary | ICD-10-CM | POA: Diagnosis not present

## 2019-10-30 ENCOUNTER — Encounter: Payer: Self-pay | Admitting: Internal Medicine

## 2019-10-31 ENCOUNTER — Other Ambulatory Visit: Payer: BC Managed Care – PPO

## 2019-11-07 DIAGNOSIS — E663 Overweight: Secondary | ICD-10-CM | POA: Diagnosis not present

## 2019-11-14 DIAGNOSIS — E663 Overweight: Secondary | ICD-10-CM | POA: Diagnosis not present

## 2019-11-18 MED FILL — ESCITALOPRAM 5 MG TABLET: 5 | 30 days supply | Qty: 30 | Fill #2

## 2019-11-23 DIAGNOSIS — R5382 Chronic fatigue, unspecified: Secondary | ICD-10-CM | POA: Diagnosis not present

## 2019-12-14 DIAGNOSIS — E663 Overweight: Secondary | ICD-10-CM | POA: Diagnosis not present

## 2019-12-20 DIAGNOSIS — R5382 Chronic fatigue, unspecified: Secondary | ICD-10-CM | POA: Diagnosis not present

## 2019-12-24 MED FILL — ESCITALOPRAM 5 MG TABLET: 5 | 30 days supply | Qty: 30 | Fill #3

## 2019-12-26 NOTE — Progress Notes (Deleted)
NEUROLOGY FOLLOW UP OFFICE NOTE  Kathie Ashea Winiarski [id number]  HISTORY OF PRESENT ILLNESS: Robin Miles is a 44 year old female with history of breast cancer status post bilateral mastectomies and chemotherapy who follows up for headaches.  UPDATE: Insurance denied [employer].  *** MRI???  Intensity:  *** Duration:  *** Frequency:  *** Frequency of abortive medication: *** Current NSAIDS:  naproxen Current analgesics:  Tylenol Current triptans:  none Current ergotamine:  none Current anti-emetic:  none Current muscle relaxants:  none Current anti-anxiolytic:  none Current sleep aide:  none Current Antihypertensive medications:  none Current Antidepressant medications:  Lexapro 12m daily Current Anticonvulsant medications:  *** Current anti-CGRP:  none Current Vitamins/Herbal/Supplements:  none Current Antihistamines/Decongestants:  none Other therapy:  none Hormone/birth control:  Norethindrone-ethinyl estradiol (for hormone replacement therapy)  Caffeine:  Does not drink much caffeine Diet:  Tries to keep hydrated (at least 8 cups a day).  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 Other pain:  no Sleep hygiene:  Overall okay  HISTORY: Onset:  She has had headaches infrequently for several years.  They have been more consistent since October.   Location:  Over and around left eye Quality:  pressure Initial intensity:  Usually dull. Denies thunderclap headache Aura:  no Premonitory Phase:  no Postdrome:  no Associated symptoms:  Photophobia.  On 2 occasions, she has had nausea and vomiting with severe headache.  She denies associated phonophobia, visual disturbance, unilateral numbness or weakness. Initial duration:  4-5 days.  She may wake up with them. Initial frequency:  They may occur persistently for 4-5 days and then headache-free for a couple of days but then returns. Initial frequency of  abortive medication: 2 days a week (usually Tylenol, sometimes ibuprofen) Triggers:  Unsure if stress-related or dehydration.  Possibly when wearing her reading glasses. Relieving factors:  Not really.   Activity:  Not aggravates  She has history of ocular migraines since after having her first child, presenting as spinning lights in both lights with peripheral vision loss lasting 10-15 minutes followed by a dull headache.   They usually occur 3 to 4 times a year.   Past NSAIDS:  ibuprofen Past analgesics:  Generic Excedrin Migraine Past abortive triptans:  none Past abortive ergotamine:  none Past muscle relaxants:  Robaxin Past anti-emetic:  Zofran, Phenergan Past antihypertensive medications:  none Past antidepressant medications:  Venlafaxine XR 37.579mtwice daily (side effects) Past anticonvulsant medications:  none Past anti-CGRP:  none Past vitamins/Herbal/Supplements:  none Past antihistamines/decongestants:  none Other past therapies:  none  Family history of headache:  Twin sister has headaches  PAST MEDICAL HISTORY: Past Medical History:  Diagnosis Date  . [place]   ER-/PR-/Her2+  . [place]  . Complication of anesthesia    takes patient a long time to wake up after anesthesia  . Complication of anesthesia    "blurry vision for 3-4 days after anesthesia"  . Family history of breast cancer   . History of bronchitis   . History of chemotherapy   . Osteoarthritis of both knees   . PAL[place]  . PONV (postoperative nausea and vomiting)     MEDICATIONS: Current Outpatient Medications on File Prior to Visit  Medication Sig Dispense Refill  . atorvastatin (LIPITOR) 10 MG tablet Take 1 tablet (10 mg total) by mouth daily. 90 tablet 1  .  escitalopram (LEXAPRO) 5 MG tablet Take 1 tablet (5 mg total) by mouth daily. 90 tablet 1  . naproxen (NAPROSYN) 500 MG tablet Take 1 tablet (500 mg total) by mouth  2 (two) times daily as needed. 20 tablet 5  . norethindrone-ethinyl estradiol (MICROGESTIN,JUNEL,LOESTRIN) 1-20 MG-MCG tablet   0  . rosuvastatin (CRESTOR) 5 MG tablet Take 1 tablet (5 mg total) by mouth daily. 90 tablet 1  . Topiramate ER (TROKENDI XR) 25 MG CP24 Take 1 capsule (25 mg total) by mouth at bedtime. 30 capsule 11   No current facility-administered medications on file prior to visit.    ALLERGIES: Allergies  Allergen Reactions  . Compazine [Prochlorperazine] Nausea And Vomiting    Makes sickness worse  . Synvisc [Hylan G-F 20]     Causes joint effusion    FAMILY HISTORY: Family History  Problem Relation Age of Onset  . Breast cancer Maternal Aunt        dx in her late 25s  . Breast cancer Paternal Grandmother        dx in her 105s  . Breast cancer Cousin        dx late 76s; BRCA-  . Cancer Cousin        dx in her 80s; leiomyosarcoma   ***.  SOCIAL HISTORY: Social History   Socioeconomic History  . Marital status: Married    Spouse name: Not on file  . Number of children: 6  . Years of education: Not on file  . Highest education level: Not on file  Occupational History  . Occupation: Therapist, sports at [employer]  . Smoking status: Never Smoker  . Smokeless tobacco: Never Used  Substance and Sexual Activity  . Alcohol use: No    Alcohol/week: 0.0 standard drinks  . Drug use: No  . Sexual activity: Yes    Birth control/protection: Surgical  Other Topics Concern  . Not on file  Social History Narrative   Right handed    Two story home   Occasionally caffeine   Social Determinants of Health   Financial Resource Strain:   . Difficulty of Paying Living Expenses:   Food Insecurity:   . Worried About Charity fundraiser in the Last Year:   . Arboriculturist in the Last Year:   Transportation Needs:   . Film/video editor (Medical):   Marland Kitchen Lack of Transportation (Non-Medical):   Physical Activity:   . Days of Exercise per Week:   .  Minutes of Exercise per Session:   Stress:   . Feeling of Stress :   Social Connections:   . Frequency of Communication with Friends and Family:   . Frequency of Social Gatherings with Friends and Family:   . Attends Religious Services:   . Active Member of Clubs or Organizations:   . Attends Archivist Meetings:   Marland Kitchen Marital Status:   Intimate Partner Violence:   . Fear of Current or Ex-Partner:   . Emotionally Abused:   Marland Kitchen Physically Abused:   . Sexually Abused:     REVIEW OF SYSTEMS: Constitutional: No fevers, chills, or sweats, no generalized fatigue, change in appetite Eyes: No visual changes, double vision, eye pain Ear, nose and throat: No hearing loss, ear pain, nasal congestion, sore throat Cardiovascular: No chest pain, palpitations Respiratory:  No shortness of breath at rest or with exertion, wheezes GastrointestinaI: No nausea, vomiting, diarrhea, abdominal pain, fecal incontinence Genitourinary:  No dysuria, urinary retention  or frequency Musculoskeletal:  No neck pain, back pain Integumentary: No rash, pruritus, skin lesions Neurological: as above Psychiatric: No depression, insomnia, anxiety Endocrine: No palpitations, fatigue, diaphoresis, mood swings, change in appetite, change in weight, increased thirst Hematologic/Lymphatic:  No purpura, petechiae. Allergic/Immunologic: no itchy/runny eyes, nasal congestion, recent allergic reactions, rashes  PHYSICAL EXAM: *** General: No acute distress.  Patient appears ***-groomed.   Head:  Normocephalic/atraumatic Eyes:  Fundi examined but not visualized Neck: supple, no paraspinal tenderness, full range of motion Heart:  Regular rate and rhythm Lungs:  Clear to auscultation bilaterally Back: No paraspinal tenderness Neurological Exam: alert and oriented to person, place, and time. Attention span and concentration intact, recent and remote memory intact, fund of knowledge intact.  Speech fluent and not  dysarthric, language intact.  CN II-XII intact. Bulk and tone normal, muscle strength 5/5 throughout.  Sensation to light touch, temperature and vibration intact.  Deep tendon reflexes 2+ throughout, toes downgoing.  Finger to nose and heel to shin testing intact.  Gait normal, Romberg negative.  IMPRESSION: ***  PLAN: ***  Metta Clines, DO  CC: ***

## 2019-12-28 ENCOUNTER — Ambulatory Visit: Payer: BC Managed Care – PPO | Admitting: Neurology

## 2020-01-05 ENCOUNTER — Telehealth: Payer: Self-pay | Admitting: Internal Medicine

## 2020-01-05 NOTE — Telephone Encounter (Signed)
Pt need a TB test need order  Not the skin test  [phone]

## 2020-01-08 ENCOUNTER — Telehealth: Payer: Self-pay | Admitting: Internal Medicine

## 2020-01-08 NOTE — Telephone Encounter (Signed)
Pt call and stated she call Friday and want you to call her back.

## 2020-01-09 ENCOUNTER — Encounter: Payer: Self-pay | Admitting: Internal Medicine

## 2020-01-09 NOTE — Telephone Encounter (Signed)
Waiting on orders for PPD labs.  See phone note.

## 2020-01-10 ENCOUNTER — Other Ambulatory Visit: Payer: Self-pay | Admitting: Internal Medicine

## 2020-01-10 DIAGNOSIS — Z111 Encounter for screening for respiratory tuberculosis: Secondary | ICD-10-CM

## 2020-01-12 ENCOUNTER — Telehealth: Payer: Self-pay | Admitting: Hematology and Oncology

## 2020-01-12 NOTE — Telephone Encounter (Signed)
Scheduled per sch msg. Called and left msg  

## 2020-01-18 ENCOUNTER — Ambulatory Visit: Payer: BC Managed Care – PPO | Admitting: Hematology and Oncology

## 2020-01-19 DIAGNOSIS — R5382 Chronic fatigue, unspecified: Secondary | ICD-10-CM | POA: Diagnosis not present

## 2020-01-23 MED FILL — ESCITALOPRAM 5 MG TABLET: 5 | 30 days supply | Qty: 30 | Fill #4

## 2020-02-07 ENCOUNTER — Telehealth: Payer: Self-pay | Admitting: *Deleted

## 2020-02-07 ENCOUNTER — Inpatient Hospital Stay: Payer: BC Managed Care – PPO | Admitting: Hematology and Oncology

## 2020-02-07 NOTE — Assessment & Plan Note (Deleted)
Right breast retroareolar mass 3.7 x 2.8 x 1.7 cm involving anterior two thirds of the breast, T2 N0 M0 stage II a clinical stage, ER 0%, appears 0%, HER-2 positive ratio 3.53, PALB 2 mutation,   Treatment summary:  1. Completed neo-adjuvant chemotherapy with TCH Perjeta started 12/04/2014 and completed 03/20/2015  2. Right Mastectomy 04/17/15: Microscopic focus of DCIS 0.1 cm 0/2 LN Neg. No inv cancer. Path CR; Left Mastectomy: Neg  3. Completed Herceptin maintenance 11/27/2015 4.Neratinib started 03/31/2016-September 2018 ----------------------------------------------------------------------------------------------------------------------------------------------------------------- Arthritisin her knees and hips  Severe hot flashessince she got oophorectomy: She could not tolerate neurontin.  Bone pain involving the right thigh and left wrist: Bone scan: Neg Bone density4/10/18: T score -0.9 (Normal)  Breast cancer surveillance: 1.Breast MRI 09/09/2017: Bilateral retropectoral implants without abnormality 2.Breast exam 02/07/2020:Benign. 3.  No role of imaging since she had bilateral mastectomies.  Weight gain issues: Currently on estradiol, weight is improving. She has been walking for exercise.  Return to clinic inone-year for follow-up

## 2020-02-07 NOTE — Telephone Encounter (Signed)
Called pt to f/u about missed appt today. Pt stated that she called to make aware she would not make it today and they will call with different appt date and time. Pt was very apologetic and advised pt to call with any other concerns

## 2020-02-19 DIAGNOSIS — R5382 Chronic fatigue, unspecified: Secondary | ICD-10-CM | POA: Diagnosis not present

## 2020-02-21 MED FILL — ESCITALOPRAM 5 MG TABLET: 5 | 30 days supply | Qty: 30 | Fill #5

## 2020-02-28 DIAGNOSIS — Z01419 Encounter for gynecological examination (general) (routine) without abnormal findings: Secondary | ICD-10-CM | POA: Diagnosis not present

## 2020-02-28 DIAGNOSIS — Z6821 Body mass index (BMI) 21.0-21.9, adult: Secondary | ICD-10-CM | POA: Diagnosis not present

## 2020-02-28 DIAGNOSIS — Z124 Encounter for screening for malignant neoplasm of cervix: Secondary | ICD-10-CM | POA: Diagnosis not present

## 2020-03-06 NOTE — Progress Notes (Signed)
Patient Care Team: Isaac Bliss, Rayford Halsted, MD as PCP - General (Internal Medicine) Nicholas Lose, MD as Consulting Physician (Hematology and Oncology) Excell Seltzer, MD (Inactive) as Consulting Physician (General Surgery) Crissie Reese, MD as Consulting Physician (Plastic Surgery) Gery Pray, MD as Consulting Physician (Radiation Oncology) Pieter Partridge, DO as Consulting Physician (Neurology)  DIAGNOSIS:    ICD-10-CM   1. Malignant neoplasm of central portion of right breast in female, estrogen receptor negative ([place])  C50.111    Z17.1     SUMMARY OF ONCOLOGIC HISTORY: Oncology History  Cancer of central portion of right female breast ([place])  11/08/2014 Mammogram   Segmental group of heterogeneous calcifications. Calcifications span 2.8 cm anterior posterior x 1.9 cm transverse x 0.6 cm craniocaudal   11/14/2014 Initial Diagnosis   Right breast biopsy 8:30 position: Invasive ductal carcinoma with DCIS, grade 2, ER 0%, PR 0%, HER-2 positive ratio 3.53   11/20/2014 Breast MRI   Right breast: 3.7 x 2.8 x 1.7 cm area of non-mass enhancement and retroareolar right breast into the lower outer quadrant and lower inner quadrant extending into the nipple involving anterior two thirds of the breast, no lymph nodes   11/20/2014 Clinical Stage   Stage IIA: T2 N0   12/04/2014 - 11/27/2015 Neo-Adjuvant Chemotherapy   Taxotere, carboplatin, Herceptin, Perjeta 6 cycles followed by Herceptin maintenance for 1 year   12/19/2014 Procedure   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   04/17/2015 Surgery   Right Mastectomy: Microscopic focus of DCIS 0.1 cm 0/2 LN Neg. No invasive cancer. Path CR;  Left Mastectomy: Neg   04/17/2015 Pathologic Stage   ypTis ypN0   07/04/2015 Surgery   Bilateral salpingo-oophorectomy   01/28/2016 Survivorship   SCP visit completed   03/31/2016 Miscellaneous   Neratinib Daily     CHIEF COMPLIANT: Surveillance of breast  cancer  INTERVAL HISTORY: Robin Miles is a 44 y.o. with above-mentioned history of HER-2 positive right breast cancer treated with neoadjuvant chemotherapy, mastectomies bilaterally because ofPALB2 mutation, bilateral salpingo-oophrectomy, and a year of Neratinib. She presents to the clinic today for follow-up.   ALLERGIES:  is allergic to compazine [prochlorperazine] and synvisc [hylan g-f 20].  MEDICATIONS:  Current Outpatient Medications  Medication Sig Dispense Refill  . atorvastatin (LIPITOR) 10 MG tablet Take 1 tablet (10 mg total) by mouth daily. 90 tablet 1  . escitalopram (LEXAPRO) 5 MG tablet Take 1 tablet (5 mg total) by mouth daily. 90 tablet 1  . naproxen (NAPROSYN) 500 MG tablet Take 1 tablet (500 mg total) by mouth 2 (two) times daily as needed. 20 tablet 5  . norethindrone-ethinyl estradiol (MICROGESTIN,JUNEL,LOESTRIN) 1-20 MG-MCG tablet   0  . rosuvastatin (CRESTOR) 5 MG tablet Take 1 tablet (5 mg total) by mouth daily. 90 tablet 1  . Topiramate ER (TROKENDI XR) 25 MG CP24 Take 1 capsule (25 mg total) by mouth at bedtime. 30 capsule 11   No current facility-administered medications for this visit.    PHYSICAL EXAMINATION: ECOG PERFORMANCE STATUS: 1 - Symptomatic but completely ambulatory  Vitals:   03/07/20 0833  BP: 131/87  Pulse: 68  Resp: 18  Temp: 98.1 F (36.7 C)  SpO2: 100%   Filed Weights   03/07/20 0833  Weight: 122 lb 8 oz (55.6 kg)    BREAST: No palpable masses or nodules in either right or left breasts. No palpable axillary supraclavicular or infraclavicular adenopathy no breast tenderness or nipple discharge. (exam performed in the presence  of a chaperone)  LABORATORY DATA:  I have reviewed the data as listed CMP Latest Ref Rng & Units 08/31/2019 12/29/2016 10/06/2016  Glucose 70 - 99 mg/dL 97 100 95  BUN 6 - 23 mg/dL 13 20.0 17.9  Creatinine 0.40 - 1.20 mg/dL 1.02 1.1 0.9  Sodium 135 - 145 mEq/L 140 141 141  Potassium 3.5 - 5.1 mEq/L  4.1 4.0 4.3  Chloride 96 - 112 mEq/L 104 - -  CO2 19 - 32 mEq/L 28 25 24   Calcium 8.4 - 10.5 mg/dL 9.8 9.7 9.7  Total Protein 6.0 - 8.3 g/dL 7.0 6.3(L) 6.4  Total Bilirubin 0.2 - 1.2 mg/dL 0.9 0.62 0.67  Alkaline Phos 39 - 117 U/L 50 65 65  AST 0 - 37 U/L 16 18 18   ALT 0 - 35 U/L 15 13 17     Lab Results  Component Value Date   WBC 6.6 08/31/2019   HGB 15.1 (H) 08/31/2019   HCT 44.8 08/31/2019   MCV 88.1 08/31/2019   PLT 302.0 08/31/2019   NEUTROABS 3.8 08/31/2019    ASSESSMENT & PLAN:  Cancer of central portion of right female breast Right breast retroareolar mass 3.7 x 2.8 x 1.7 cm involving anterior two thirds of the breast, T2 N0 M0 stage II a clinical stage, ER 0%, appears 0%, HER-2 positive ratio 3.53, PALB 2 mutation,   Treatment summary:  1. Completed neo-adjuvant chemotherapy with TCH Perjeta started 12/04/2014 and completed 03/20/2015  2. Right Mastectomy 04/17/15: Microscopic focus of DCIS 0.1 cm 0/2 LN Neg. No inv cancer. Path CR; Left Mastectomy: Neg  3. Completed Herceptin maintenance 11/27/2015 4.Neratinib started 03/31/2016-September 2018 ----------------------------------------------------------------------------------------------------------------------------------------------------------------- Arthritisin her knees and hips  Severe hot flashessince she got oophorectomy: She could not tolerate neurontin. Currently on estrogen replacement therapy. I discussed with her that we need to keep the duration of estrogen replacement as short as necessary.  Bone pain involving the right thigh and left wrist: Bone scan: Neg Bone density4/10/18: T score -0.9 (Normal)  Breast cancer surveillance: 1.Breast MRI 09/09/2017: Bilateral retropectoral implants without abnormality 2.Breast exam 03/07/2020:Benign.  Weight gain issues: Currently on estradiol, weight is improving. Staying active.  Recurrent headaches: Most likely migraines.  She is seeing  neurology. She works as a Designer, jewellery at [employer].  She is moving to [facility] starting September.  Return to clinic inone-year for follow-up    No orders of the defined types were placed in this encounter.  The patient has a good understanding of the overall plan. she agrees with it. she will call with any problems that may develop before the next visit here.  Total time spent: 20 mins including face to face time and time spent for planning, charting and coordination of care  Nicholas Lose, MD 03/07/2020  I, Cloyde Reams Dorshimer, am acting as scribe for Dr. Nicholas Lose.  I have reviewed the above documentation for accuracy and completeness, and I agree with the above.

## 2020-03-07 ENCOUNTER — Inpatient Hospital Stay: Payer: BC Managed Care – PPO | Attending: Hematology and Oncology | Admitting: Hematology and Oncology

## 2020-03-07 ENCOUNTER — Other Ambulatory Visit: Payer: Self-pay

## 2020-03-07 DIAGNOSIS — Z79899 Other long term (current) drug therapy: Secondary | ICD-10-CM | POA: Insufficient documentation

## 2020-03-07 DIAGNOSIS — Z171 Estrogen receptor negative status [ER-]: Secondary | ICD-10-CM

## 2020-03-07 DIAGNOSIS — C50111 Malignant neoplasm of central portion of right female breast: Secondary | ICD-10-CM | POA: Diagnosis not present

## 2020-03-07 DIAGNOSIS — Z90722 Acquired absence of ovaries, bilateral: Secondary | ICD-10-CM | POA: Insufficient documentation

## 2020-03-07 DIAGNOSIS — Z9011 Acquired absence of right breast and nipple: Secondary | ICD-10-CM | POA: Insufficient documentation

## 2020-03-07 DIAGNOSIS — R519 Headache, unspecified: Secondary | ICD-10-CM | POA: Diagnosis not present

## 2020-03-07 DIAGNOSIS — Z793 Long term (current) use of hormonal contraceptives: Secondary | ICD-10-CM | POA: Insufficient documentation

## 2020-03-07 DIAGNOSIS — Z9221 Personal history of antineoplastic chemotherapy: Secondary | ICD-10-CM | POA: Diagnosis not present

## 2020-03-07 DIAGNOSIS — Z853 Personal history of malignant neoplasm of breast: Secondary | ICD-10-CM | POA: Diagnosis not present

## 2020-03-07 DIAGNOSIS — Z923 Personal history of irradiation: Secondary | ICD-10-CM | POA: Diagnosis not present

## 2020-03-07 DIAGNOSIS — Z9079 Acquired absence of other genital organ(s): Secondary | ICD-10-CM | POA: Diagnosis not present

## 2020-03-07 MED ORDER — ERGOCALCIFEROL 1.25 MG (50000 UT) PO CAPS: 50000.0000 [IU] | ORAL_CAPSULE | ORAL | Status: AC

## 2020-03-07 MED ORDER — VITAMIN B-12 1000 MCG PO TABS: 1000.0000 ug | ORAL_TABLET | Freq: Every day | ORAL | Status: AC

## 2020-03-07 NOTE — Assessment & Plan Note (Signed)
Right breast retroareolar mass 3.7 x 2.8 x 1.7 cm involving anterior two thirds of the breast, T2 N0 M0 stage II a clinical stage, ER 0%, appears 0%, HER-2 positive ratio 3.53, PALB 2 mutation,   Treatment summary:  1. Completed neo-adjuvant chemotherapy with TCH Perjeta started 12/04/2014 and completed 03/20/2015  2. Right Mastectomy 04/17/15: Microscopic focus of DCIS 0.1 cm 0/2 LN Neg. No inv cancer. Path CR; Left Mastectomy: Neg  3. Completed Herceptin maintenance 11/27/2015 4.Neratinib started 03/31/2016-September 2018 ----------------------------------------------------------------------------------------------------------------------------------------------------------------- Arthritisin her knees and hips  Severe hot flashessince she got oophorectomy: She could not tolerate neurontin.  Bone pain involving the right thigh and left wrist: Bone scan: Neg Bone density4/10/18: T score -0.9 (Normal)  Breast cancer surveillance: 1.Breast MRI 09/09/2017: Bilateral retropectoral implants without abnormality 2.Breast exam 03/07/2020:Benign.  Weight gain issues: Currently on estradiol, weight is improving. She has been walking for exercise.  Return to clinic inone-year for follow-up

## 2020-03-07 NOTE — Addendum Note (Signed)
Addended by: Nicholas Lose on: 03/07/2020 09:45 AM   Modules accepted: Orders

## 2020-03-08 ENCOUNTER — Telehealth: Payer: Self-pay | Admitting: Hematology and Oncology

## 2020-03-08 NOTE — Telephone Encounter (Signed)
No 8/19 los. No changes made to pt's schedule.

## 2020-03-20 DIAGNOSIS — R5382 Chronic fatigue, unspecified: Secondary | ICD-10-CM | POA: Diagnosis not present

## 2020-03-26 ENCOUNTER — Other Ambulatory Visit: Payer: Self-pay | Admitting: Internal Medicine

## 2020-03-26 DIAGNOSIS — Z1509 Genetic susceptibility to other malignant neoplasm: Secondary | ICD-10-CM

## 2020-03-26 DIAGNOSIS — C50919 Malignant neoplasm of unspecified site of unspecified female breast: Secondary | ICD-10-CM

## 2020-03-27 ENCOUNTER — Other Ambulatory Visit: Payer: Self-pay | Admitting: Internal Medicine

## 2020-03-27 MED FILL — ESCITALOPRAM 5 MG TABLET: 5 | 30 days supply | Qty: 30 | Fill #0

## 2020-04-17 DIAGNOSIS — R5382 Chronic fatigue, unspecified: Secondary | ICD-10-CM | POA: Diagnosis not present

## 2020-04-30 MED FILL — ESCITALOPRAM 5 MG TABLET: 5 | 30 days supply | Qty: 30 | Fill #1

## 2020-05-15 DIAGNOSIS — R5382 Chronic fatigue, unspecified: Secondary | ICD-10-CM | POA: Diagnosis not present

## 2020-05-22 ENCOUNTER — Other Ambulatory Visit: Payer: Self-pay | Admitting: Internal Medicine

## 2020-05-22 DIAGNOSIS — C50919 Malignant neoplasm of unspecified site of unspecified female breast: Secondary | ICD-10-CM

## 2020-05-22 DIAGNOSIS — Z1589 Genetic susceptibility to other disease: Secondary | ICD-10-CM

## 2020-05-22 MED FILL — ESCITALOPRAM 5 MG TABLET: 5 | 30 days supply | Qty: 30 | Fill #0

## 2020-06-11 DIAGNOSIS — R5382 Chronic fatigue, unspecified: Secondary | ICD-10-CM | POA: Diagnosis not present

## 2020-06-26 MED FILL — ESCITALOPRAM 5 MG TABLET: 5 | 30 days supply | Qty: 30 | Fill #1

## 2020-07-10 DIAGNOSIS — R5382 Chronic fatigue, unspecified: Secondary | ICD-10-CM | POA: Diagnosis not present

## 2020-10-08 ENCOUNTER — Encounter: Payer: Self-pay | Admitting: Hematology and Oncology

## 2020-10-09 ENCOUNTER — Telehealth: Payer: Self-pay | Admitting: Hematology and Oncology

## 2020-10-09 NOTE — Telephone Encounter (Signed)
Scheduled appt per 3/22 sch msg. Pt aware.  

## 2020-10-10 ENCOUNTER — Other Ambulatory Visit (HOSPITAL_BASED_OUTPATIENT_CLINIC_OR_DEPARTMENT_OTHER): Payer: Self-pay

## 2020-11-29 ENCOUNTER — Encounter: Payer: Self-pay | Admitting: Hematology and Oncology

## 2020-12-24 ENCOUNTER — Encounter: Payer: Self-pay | Admitting: Hematology and Oncology

## 2020-12-31 ENCOUNTER — Inpatient Hospital Stay: Payer: BC Managed Care – PPO | Admitting: Hematology and Oncology

## 2021-01-16 ENCOUNTER — Telehealth: Payer: Self-pay | Admitting: Hematology and Oncology

## 2021-01-16 NOTE — Telephone Encounter (Signed)
Scheduled appt per 6/29 sch msg. Pt aware.  

## 2021-02-06 ENCOUNTER — Encounter: Payer: Self-pay | Admitting: Hematology and Oncology

## 2021-02-06 NOTE — Progress Notes (Signed)
 Patient Care Team: Hemberg, Katherine V, NP as PCP - General (Adult Health Nurse Practitioner) Gudena, Vinay, MD as Consulting Physician (Hematology and Oncology) Hoxworth, Benjamin, MD (Inactive) as Consulting Physician (General Surgery) Bowers, David, MD as Consulting Physician (Plastic Surgery) Kinard, James, MD as Consulting Physician (Radiation Oncology) Jaffe, Adam R, DO as Consulting Physician (Neurology)  DIAGNOSIS:    ICD-10-CM   1. Malignant neoplasm of central portion of right breast in female, estrogen receptor negative (HCC)  C50.111    Z17.1       SUMMARY OF ONCOLOGIC HISTORY: Oncology History  Cancer of central portion of right female breast (HCC)  11/08/2014 Mammogram   Segmental group of heterogeneous calcifications. Calcifications span 2.8 cm anterior posterior x 1.9 cm transverse x 0.6 cm craniocaudal    11/14/2014 Initial Diagnosis   Right breast biopsy 8:30 position: Invasive ductal carcinoma with DCIS, grade 2, ER 0%, PR 0%, HER-2 positive ratio 3.53    11/20/2014 Breast MRI   Right breast: 3.7 x 2.8 x 1.7 cm area of non-mass enhancement and retroareolar right breast into the lower outer quadrant and lower inner quadrant extending into the nipple involving anterior two thirds of the breast, no lymph nodes    11/20/2014 Clinical Stage   Stage IIA: T2 N0    12/04/2014 - 11/27/2015 Neo-Adjuvant Chemotherapy   Taxotere, carboplatin, Herceptin, Perjeta 6 cycles followed by Herceptin maintenance for 1 year    12/19/2014 Procedure   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    04/17/2015 Surgery   Right Mastectomy: Microscopic focus of DCIS 0.1 cm 0/2 LN Neg. No invasive cancer. Path CR;  Left Mastectomy: Neg    04/17/2015 Pathologic Stage   ypTis ypN0    07/04/2015 Surgery   Bilateral salpingo-oophorectomy    01/28/2016 Survivorship   SCP visit completed    03/31/2016 - 03/31/2017 Chemotherapy   Neratinib       CHIEF COMPLIANT: Follow-up of right breast cancer  INTERVAL HISTORY: Robin Miles is a 44 y.o. with above-mentioned history of HER-2 positive right breast cancer treated with neoadjuvant chemotherapy, mastectomies bilaterally because of PALB 2 mutation, bilateral salpingo-oophrectomy, and a year of Neratinib. She presents to the clinic today for follow-up.  She denies any pain or discomfort in the bilateral breast implant area.  She has had some hip pain.  Injury she related to an exercise bench press injury.  Denies any other pain or symptoms.  She gets migraines intermittently when she does not get good sleep.  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  Hot flashes have improved markedly on estrogen replacement therapy.   ALLERGIES:  is allergic to compazine [prochlorperazine] and synvisc [hylan g-f 20].  MEDICATIONS:  Current Outpatient Medications  Medication Sig Dispense Refill   ergocalciferol (VITAMIN D2) 1.25 MG (50000 UT) capsule Take 1 capsule (50,000 Units total) by mouth once a week.     escitalopram (LEXAPRO) 5 MG tablet TAKE 1 TABLET BY MOUTH ONCE DAILY 90 tablet 0   naproxen (NAPROSYN) 500 MG tablet Take 1 tablet (500 mg total) by mouth 2 (two) times daily as needed. 20 tablet 5   norethindrone-ethinyl estradiol (MICROGESTIN,JUNEL,LOESTRIN) 1-20 MG-MCG tablet   0   vitamin B-12 (CYANOCOBALAMIN) 1000 MCG tablet Take 1 tablet (1,000 mcg total) by mouth daily.     No current facility-administered medications for this visit.    PHYSICAL EXAMINATION: ECOG PERFORMANCE STATUS: 1 - Symptomatic but completely ambulatory  Vitals:     02/07/21 1139  BP: (!) 144/94  Pulse: 91  Resp: 18  Temp: 97.7 F (36.5 C)  SpO2: 100%   Filed Weights   02/07/21 1139  Weight: 124 lb 5 oz (56.4 kg)    LABORATORY DATA:  I have reviewed the data as listed CMP Latest Ref Rng & Units 08/31/2019 12/29/2016 10/06/2016  Glucose 70 - 99 mg/dL 97 100 95  BUN 6 - 23 mg/dL 13  20.0 17.9  Creatinine 0.40 - 1.20 mg/dL 1.02 1.1 0.9  Sodium 135 - 145 mEq/L 140 141 141  Potassium 3.5 - 5.1 mEq/L 4.1 4.0 4.3  Chloride 96 - 112 mEq/L 104 - -  CO2 19 - 32 mEq/L 28 25 24  Calcium 8.4 - 10.5 mg/dL 9.8 9.7 9.7  Total Protein 6.0 - 8.3 g/dL 7.0 6.3(L) 6.4  Total Bilirubin 0.2 - 1.2 mg/dL 0.9 0.62 0.67  Alkaline Phos 39 - 117 U/L 50 65 65  AST 0 - 37 U/L 16 18 18  ALT 0 - 35 U/L 15 13 17    Lab Results  Component Value Date   WBC 6.6 08/31/2019   HGB 15.1 (H) 08/31/2019   HCT 44.8 08/31/2019   MCV 88.1 08/31/2019   PLT 302.0 08/31/2019   NEUTROABS 3.8 08/31/2019    ASSESSMENT & PLAN:  Cancer of central portion of right female breast Right breast retroareolar mass 3.7 x 2.8 x 1.7 cm involving anterior two thirds of the breast, T2 N0 M0 stage II a clinical stage, ER 0%, appears 0%, HER-2 positive ratio 3.53, PALB 2 mutation,   Treatment summary: 1. Completed neo-adjuvant chemotherapy with TCH Perjeta started 12/04/2014 and completed 03/20/2015   2. Right Mastectomy 04/17/15: Microscopic focus of DCIS 0.1 cm 0/2 LN Neg. No inv cancer. Path CR; Left Mastectomy: Neg   3. Completed Herceptin maintenance 11/27/2015 4. Neratinib started 03/31/2016-September 2018 -----------------------------------------------------------------------------------------------------------------------------------------------------------------  Arthritis in her knees and hips   Severe hot flashes since she got oophorectomy: Currently on estrogen replacement therapy and she is much better.   Bone density 10/27/16: T score -0.9 (Normal)   Breast cancer surveillance: 1.  Breast MRI 09/09/2017: Bilateral retropectoral implants without abnormality, no role of routine imaging because she had bilateral mastectomies 2. Breast exam  02/07/2021: Benign.      Recurrent headaches: Most likely migraines.  Related to lack of sleep or lack of hydration.  She is seeing neurology. 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  She is moving to [facility] starting September.   Return to clinic in one-year for follow-up    No orders of the defined types were placed in this encounter.  The patient has a good understanding of the overall plan. she agrees with it. she will call with any problems that may develop before the next visit here.  Total time spent: 20 mins including face to face time and time spent for planning, charting and coordination of care  Vinay K Gudena, MD, MPH 02/07/2021  I, Kirstyn Evans, am acting as scribe for Dr. Vinay Gudena.  I have reviewed the above documentation for accuracy and completeness, and I agree with the above.       

## 2021-02-07 ENCOUNTER — Inpatient Hospital Stay: Payer: Managed Care, Other (non HMO) | Attending: Hematology and Oncology | Admitting: Hematology and Oncology

## 2021-02-07 ENCOUNTER — Other Ambulatory Visit: Payer: Self-pay

## 2021-02-07 DIAGNOSIS — Z171 Estrogen receptor negative status [ER-]: Secondary | ICD-10-CM | POA: Diagnosis not present

## 2021-02-07 DIAGNOSIS — Z9011 Acquired absence of right breast and nipple: Secondary | ICD-10-CM | POA: Diagnosis not present

## 2021-02-07 DIAGNOSIS — R519 Headache, unspecified: Secondary | ICD-10-CM | POA: Insufficient documentation

## 2021-02-07 DIAGNOSIS — Z9221 Personal history of antineoplastic chemotherapy: Secondary | ICD-10-CM | POA: Insufficient documentation

## 2021-02-07 DIAGNOSIS — Z853 Personal history of malignant neoplasm of breast: Secondary | ICD-10-CM | POA: Insufficient documentation

## 2021-02-07 DIAGNOSIS — Z79899 Other long term (current) drug therapy: Secondary | ICD-10-CM | POA: Diagnosis not present

## 2021-02-07 DIAGNOSIS — C50111 Malignant neoplasm of central portion of right female breast: Secondary | ICD-10-CM | POA: Diagnosis not present

## 2021-02-07 DIAGNOSIS — M1990 Unspecified osteoarthritis, unspecified site: Secondary | ICD-10-CM | POA: Diagnosis not present

## 2021-02-07 NOTE — Assessment & Plan Note (Signed)
Right breast retroareolar mass 3.7 x 2.8 x 1.7 cm involving anterior two thirds of the breast, T2 N0 M0 stage II a clinical stage, ER 0%, appears 0%, HER-2 positive ratio 3.53, PALB 2 mutation,  Treatment summary: 1. Completed neo-adjuvant chemotherapy with TCH Perjeta started 12/04/2014 and completed 03/20/2015  2. Right Mastectomy 04/17/15: Microscopic focus of DCIS 0.1 cm 0/2 LN Neg. No inv cancer. Path CR; Left Mastectomy: Neg  3. Completed Herceptin maintenance 11/27/2015 4.Neratinib started 03/31/2016-September 2018 ----------------------------------------------------------------------------------------------------------------------------------------------------------------- Arthritisin her knees and hips  Severe hot flashessince she got oophorectomy: She could not tolerate neurontin. Currently on estrogen replacement therapy. I discussed with her that we need to keep the duration of estrogen replacement as short as necessary.  Bone pain involving the right thigh and left wrist: Bone scan: Neg Bone density4/10/18: T score -0.9 (Normal)  Breast cancer surveillance: 1.Breast MRI 09/09/2017: Bilateral retropectoral implants without abnormality, no role of routine imaging because she had bilateral mastectomies 2.Breast exam  02/07/2021:Benign.  Weight gain issues:Currently on estradiol, weight is improving. Staying active.  Recurrent headaches: Most likely migraines.  She is seeing neurology. 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  She is moving to [facility] starting September.  Return to clinic inone-year for follow-up 

## 2021-02-10 ENCOUNTER — Other Ambulatory Visit (HOSPITAL_COMMUNITY): Payer: Self-pay

## 2022-02-06 NOTE — Progress Notes (Signed)
Patient Care Team: Bridget Hartshorn, NP as PCP - General (Adult Health Nurse Practitioner) Nicholas Lose, MD as Consulting Physician (Hematology and Oncology) Excell Seltzer, MD (Inactive) as Consulting Physician (General Surgery) Crissie Reese, MD as Consulting Physician (Plastic Surgery) Gery Pray, MD as Consulting Physician (Radiation Oncology) Pieter Partridge, DO as Consulting Physician (Neurology)  DIAGNOSIS: No diagnosis found.  SUMMARY OF ONCOLOGIC HISTORY: Oncology History  Cancer of central portion of right female breast ([facility])  11/08/2014 Mammogram   Segmental group of heterogeneous calcifications. Calcifications span 2.8 cm anterior posterior x 1.9 cm transverse x 0.6 cm craniocaudal   11/14/2014 Initial Diagnosis   Right breast biopsy 8:30 position: Invasive ductal carcinoma with DCIS, grade 2, ER 0%, PR 0%, HER-2 positive ratio 3.53   11/20/2014 Breast MRI   Right breast: 3.7 x 2.8 x 1.7 cm area of non-mass enhancement and retroareolar right breast into the lower outer quadrant and lower inner quadrant extending into the nipple involving anterior two thirds of the breast, no lymph nodes   11/20/2014 Clinical Stage   Stage IIA: T2 N0   12/04/2014 - 11/27/2015 Neo-Adjuvant Chemotherapy   Taxotere, carboplatin, Herceptin, Perjeta 6 cycles followed by Herceptin maintenance for 1 year   12/19/2014 Procedure   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   04/17/2015 Surgery   Right Mastectomy: Microscopic focus of DCIS 0.1 cm 0/2 LN Neg. No invasive cancer. Path CR;  Left Mastectomy: Neg   04/17/2015 Pathologic Stage   ypTis ypN0   07/04/2015 Surgery   Bilateral salpingo-oophorectomy   01/28/2016 Survivorship   SCP visit completed   03/31/2016 - 03/31/2017 Chemotherapy   Neratinib      CHIEF COMPLIANT: Follow-up of right breast cancer survelliance  INTERVAL HISTORY: Robin Miles is a  46 y.o. with above-mentioned history of  HER-2 positive right breast cancer. She presents to the clinic today for an annual follow-up.   ALLERGIES:  is allergic to compazine [prochlorperazine] and synvisc [hylan g-f 20].  MEDICATIONS:  Current Outpatient Medications  Medication Sig Dispense Refill   ergocalciferol (VITAMIN D2) 1.25 MG (50000 UT) capsule Take 1 capsule (50,000 Units total) by mouth once a week.     escitalopram (LEXAPRO) 5 MG tablet TAKE 1 TABLET BY MOUTH ONCE DAILY 90 tablet 0   naproxen (NAPROSYN) 500 MG tablet Take 1 tablet (500 mg total) by mouth 2 (two) times daily as needed. 20 tablet 5   norethindrone-ethinyl estradiol (MICROGESTIN,JUNEL,LOESTRIN) 1-20 MG-MCG tablet   0   vitamin B-12 (CYANOCOBALAMIN) 1000 MCG tablet Take 1 tablet (1,000 mcg total) by mouth daily.     No current facility-administered medications for this visit.    PHYSICAL EXAMINATION: ECOG PERFORMANCE STATUS: {CHL ONC ECOG PS:[id number]}  There were no vitals filed for this visit. There were no vitals filed for this visit.  BREAST:*** No palpable masses or nodules in either right or left breasts. No palpable axillary supraclavicular or infraclavicular adenopathy no breast tenderness or nipple discharge. (exam performed in the presence of a chaperone)  LABORATORY DATA:  I have reviewed the data as listed    Latest Ref Rng & Units 08/31/2019   10:52 AM 12/29/2016    9:30 AM 10/06/2016   10:18 AM  CMP  Glucose 70 - 99 mg/dL 97  100  95   BUN 6 - 23 mg/dL 13  20.0  17.9   Creatinine 0.40 - 1.20 mg/dL 1.02  1.1  0.9   Sodium 135 - 145 mEq/L  140  141  141   Potassium 3.5 - 5.1 mEq/L 4.1  4.0  4.3   Chloride 96 - 112 mEq/L 104     CO2 19 - 32 mEq/L 28  25  24    Calcium 8.4 - 10.5 mg/dL 9.8  9.7  9.7   Total Protein 6.0 - 8.3 g/dL 7.0  6.3  6.4   Total Bilirubin 0.2 - 1.2 mg/dL 0.9  0.62  0.67   Alkaline Phos 39 - 117 U/L 50  65  65   AST 0 - 37 U/L 16  18  18    ALT 0 - 35 U/L 15  13  17      Lab Results  Component Value Date    WBC 6.6 08/31/2019   HGB 15.1 (H) 08/31/2019   HCT 44.8 08/31/2019   MCV 88.1 08/31/2019   PLT 302.0 08/31/2019   NEUTROABS 3.8 08/31/2019    ASSESSMENT & PLAN:  No problem-specific Assessment & Plan notes found for this encounter.    No orders of the defined types were placed in this encounter.  The patient has a good understanding of the overall plan. she agrees with it. she will call with any problems that may develop before the next visit here. Total time spent: 30 mins including face to face time and time spent for planning, charting and co-ordination of care   Suzzette Righter, Tabiona 02/06/22    I Gardiner Coins am scribing for Dr. Lindi Adie  ***

## 2022-02-09 ENCOUNTER — Inpatient Hospital Stay: Payer: Managed Care, Other (non HMO) | Attending: Hematology and Oncology | Admitting: Hematology and Oncology

## 2022-02-09 ENCOUNTER — Other Ambulatory Visit: Payer: Self-pay

## 2022-02-09 DIAGNOSIS — Z853 Personal history of malignant neoplasm of breast: Secondary | ICD-10-CM | POA: Diagnosis present

## 2022-02-09 DIAGNOSIS — Z9011 Acquired absence of right breast and nipple: Secondary | ICD-10-CM | POA: Diagnosis not present

## 2022-02-09 DIAGNOSIS — C50111 Malignant neoplasm of central portion of right female breast: Secondary | ICD-10-CM

## 2022-02-09 DIAGNOSIS — Z9221 Personal history of antineoplastic chemotherapy: Secondary | ICD-10-CM | POA: Insufficient documentation

## 2022-02-09 DIAGNOSIS — Z171 Estrogen receptor negative status [ER-]: Secondary | ICD-10-CM

## 2022-02-09 NOTE — Assessment & Plan Note (Addendum)
Right breast retroareolar mass 3.7 x 2.8 x 1.7 cm involving anterior two thirds of the breast, T2 N0 M0 stage II a clinical stage, ER 0%, appears 0%, HER-2 positive ratio 3.53, PALB 2 mutation,  Treatment summary: 1. Completed neo-adjuvant chemotherapy with TCH Perjeta started 12/04/2014 and completed 03/20/2015  2. Right Mastectomy 04/17/15: Microscopic focus of DCIS 0.1 cm 0/2 LN Neg. No inv cancer. Path CR; Left Mastectomy: Neg  3. Completed Herceptin maintenance 11/27/2015 4.Neratinib started 03/31/2016-September 2018 ----------------------------------------------------------------------------------------------------------------------------------------------------------------- Arthritisin her knees and hips  Severe hot flashes  Currently on estrogen replacement therapy and she is much better.   Bone density4/10/18: T score -0.9 (Normal)  Breast cancer surveillance: 1.Breast MRI 09/09/2017: Bilateral retropectoral implants without abnormality, no role of routine imaging because she had bilateral mastectomies 2.Breast exam7/24/2023benign.   Recurrent headaches: Resolved  She works as a Designer, jewellery at [employer]. She is moving to [facility] starting September.  Return to clinic inone-year for follow-up

## 2022-02-10 ENCOUNTER — Telehealth: Payer: Self-pay | Admitting: Hematology and Oncology

## 2022-02-10 NOTE — Telephone Encounter (Signed)
Scheduled appointment per 7/24 los. Patient is aware. 

## 2023-01-29 ENCOUNTER — Telehealth: Payer: Self-pay | Admitting: Hematology and Oncology

## 2023-01-29 NOTE — Telephone Encounter (Signed)
Rescheduled appointment per [facility]. Left voicemail for patient. 

## 2023-02-08 ENCOUNTER — Encounter: Payer: Self-pay | Admitting: Hematology and Oncology

## 2023-02-10 ENCOUNTER — Ambulatory Visit: Payer: Managed Care, Other (non HMO) | Admitting: Hematology and Oncology

## 2023-02-14 NOTE — Progress Notes (Signed)
Patient Care Team: Rebecka Apley, NP as PCP - General (Adult Health Nurse Practitioner) Serena Croissant, MD as Consulting Physician (Hematology and Oncology) Glenna Fellows, MD (Inactive) as Consulting Physician (General Surgery) Etter Sjogren, MD as Consulting Physician (Plastic Surgery) Antony Blackbird, MD as Consulting Physician (Radiation Oncology) Drema Dallas, DO as Consulting Physician (Neurology)  DIAGNOSIS: No diagnosis found.  SUMMARY OF ONCOLOGIC HISTORY: Oncology History  Cancer of central portion of right female breast (HCC)  11/08/2014 Mammogram   Segmental group of heterogeneous calcifications. Calcifications span 2.8 cm anterior posterior x 1.9 cm transverse x 0.6 cm craniocaudal   11/14/2014 Initial Diagnosis   Right breast biopsy 8:30 position: Invasive ductal carcinoma with DCIS, grade 2, ER 0%, PR 0%, HER-2 positive ratio 3.53   11/20/2014 Breast MRI   Right breast: 3.7 x 2.8 x 1.7 cm area of non-mass enhancement and retroareolar right breast into the lower outer quadrant and lower inner quadrant extending into the nipple involving anterior two thirds of the breast, no lymph nodes   11/20/2014 Clinical Stage   Stage IIA: T2 N0   12/04/2014 - 11/27/2015 Neo-Adjuvant Chemotherapy   Taxotere, carboplatin, Herceptin, Perjeta 6 cycles followed by Herceptin maintenance for 1 year   12/19/2014 Procedure   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   04/17/2015 Surgery   Right Mastectomy: Microscopic focus of DCIS 0.1 cm 0/2 LN Neg. No invasive cancer. Path CR;  Left Mastectomy: Neg   04/17/2015 Pathologic Stage   ypTis ypN0   07/04/2015 Surgery   Bilateral salpingo-oophorectomy   01/28/2016 Survivorship   SCP visit completed   03/31/2016 - 03/31/2017 Chemotherapy   Neratinib      CHIEF COMPLIANT: Follow-up of right breast cancer survelliance   INTERVAL HISTORY: Robin Miles is a 47 y.o. with above-mentioned history of  HER-2 positive right breast cancer. She presents to the clinic today for an annual follow-up.    ALLERGIES:  is allergic to compazine [prochlorperazine] and synvisc [hylan g-f 20].  MEDICATIONS:  Current Outpatient Medications  Medication Sig Dispense Refill   ergocalciferol (VITAMIN D2) 1.25 MG (50000 UT) capsule Take 1 capsule (50,000 Units total) by mouth once a week.     escitalopram (LEXAPRO) 5 MG tablet TAKE 1 TABLET BY MOUTH ONCE DAILY 90 tablet 0   naproxen (NAPROSYN) 500 MG tablet Take 1 tablet (500 mg total) by mouth 2 (two) times daily as needed. 20 tablet 5   norethindrone-ethinyl estradiol (MICROGESTIN,JUNEL,LOESTRIN) 1-20 MG-MCG tablet   0   vitamin B-12 (CYANOCOBALAMIN) 1000 MCG tablet Take 1 tablet (1,000 mcg total) by mouth daily.     No current facility-administered medications for this visit.    PHYSICAL EXAMINATION: ECOG PERFORMANCE STATUS: {CHL ONC ECOG PS:[id number]}  There were no vitals filed for this visit. There were no vitals filed for this visit.  BREAST:*** No palpable masses or nodules in either right or left breasts. No palpable axillary supraclavicular or infraclavicular adenopathy no breast tenderness or nipple discharge. (exam performed in the presence of a chaperone)  LABORATORY DATA:  I have reviewed the data as listed    Latest Ref Rng & Units 08/31/2019   10:52 AM 12/29/2016    9:30 AM 10/06/2016   10:18 AM  CMP  Glucose 70 - 99 mg/dL 97  161  95   BUN 6 - 23 mg/dL 13  09.6  04.5   Creatinine 0.40 - 1.20 mg/dL 4.09  1.1  0.9   Sodium 135 - 145  mEq/L 140  141  141   Potassium 3.5 - 5.1 mEq/L 4.1  4.0  4.3   Chloride 96 - 112 mEq/L 104     CO2 19 - 32 mEq/L 28  25  24    Calcium 8.4 - 10.5 mg/dL 9.8  9.7  9.7   Total Protein 6.0 - 8.3 g/dL 7.0  6.3  6.4   Total Bilirubin 0.2 - 1.2 mg/dL 0.9  1.61  0.96   Alkaline Phos 39 - 117 U/L 50  65  65   AST 0 - 37 U/L 16  18  18    ALT 0 - 35 U/L 15  13  17      Lab Results  Component Value Date    WBC 6.6 08/31/2019   HGB 15.1 (H) 08/31/2019   HCT 44.8 08/31/2019   MCV 88.1 08/31/2019   PLT 302.0 08/31/2019   NEUTROABS 3.8 08/31/2019    ASSESSMENT & PLAN:  No problem-specific Assessment & Plan notes found for this encounter.    No orders of the defined types were placed in this encounter.  The patient has a good understanding of the overall plan. she agrees with it. she will call with any problems that may develop before the next visit here. Total time spent: 30 mins including face to face time and time spent for planning, charting and co-ordination of care   Sherlyn Lick, CMA 02/14/23    I Janan Ridge am acting as a Neurosurgeon for [employer]  ***

## 2023-02-15 ENCOUNTER — Other Ambulatory Visit: Payer: Self-pay

## 2023-02-15 ENCOUNTER — Inpatient Hospital Stay: Payer: Commercial Managed Care - PPO | Attending: Hematology and Oncology | Admitting: Hematology and Oncology

## 2023-02-15 VITALS — BP 133/92 | HR 73 | Temp 98.0°F | Resp 18 | Ht 62.25 in | Wt 126.7 lb

## 2023-02-15 DIAGNOSIS — Z171 Estrogen receptor negative status [ER-]: Secondary | ICD-10-CM | POA: Diagnosis not present

## 2023-02-15 DIAGNOSIS — C50111 Malignant neoplasm of central portion of right female breast: Secondary | ICD-10-CM

## 2023-02-15 DIAGNOSIS — Z853 Personal history of malignant neoplasm of breast: Secondary | ICD-10-CM | POA: Diagnosis not present

## 2023-02-15 NOTE — Assessment & Plan Note (Addendum)
Right breast retroareolar mass 3.7 x 2.8 x 1.7 cm involving anterior two thirds of the breast, T2 N0 M0 stage II a clinical stage, ER 0%, appears 0%, HER-2 positive ratio 3.53, PALB 2 mutation,   Treatment summary: 1. Completed neo-adjuvant chemotherapy with TCH Perjeta started 12/04/2014 and completed 03/20/2015   2. Right Mastectomy 04/17/15: Microscopic focus of DCIS 0.1 cm 0/2 LN Neg. No inv cancer. Path CR; Left Mastectomy: Neg   3. Completed Herceptin maintenance 11/27/2015 4. Neratinib started 03/31/2016-September 2018 -----------------------------------------------------------------------------------------------------------------------------------------------------------------  Arthritis in her knees and hips   Severe hot flashes   Currently on estrogen replacement therapy and she is much better.   Bone density 10/27/16: T score -0.9 (Normal)   Breast cancer surveillance: 1.  Breast MRI 09/09/2017: Bilateral retropectoral implants without abnormality, no role of routine imaging because she had bilateral mastectomies 2. Breast exam 02/15/2023 benign.     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   Return to clinic in one-year for follow-up

## 2023-03-08 ENCOUNTER — Other Ambulatory Visit (HOSPITAL_COMMUNITY): Payer: Self-pay

## 2023-03-08 ENCOUNTER — Encounter: Payer: Self-pay | Admitting: Hematology and Oncology

## 2023-03-08 ENCOUNTER — Other Ambulatory Visit: Payer: Self-pay

## 2023-03-08 DIAGNOSIS — E539 Vitamin B deficiency, unspecified: Secondary | ICD-10-CM | POA: Diagnosis not present

## 2023-03-08 DIAGNOSIS — F419 Anxiety disorder, unspecified: Secondary | ICD-10-CM | POA: Diagnosis not present

## 2023-03-08 DIAGNOSIS — Z30011 Encounter for initial prescription of contraceptive pills: Secondary | ICD-10-CM | POA: Diagnosis not present

## 2023-03-08 DIAGNOSIS — R5382 Chronic fatigue, unspecified: Secondary | ICD-10-CM | POA: Diagnosis not present

## 2023-03-08 MED ORDER — ESCITALOPRAM OXALATE 5 MG PO TABS
5.0000 mg | ORAL_TABLET | Freq: Every day | ORAL | 1 refills | Status: DC
  Filled 2023-03-08 – 2023-03-16 (×4): qty 90, 90d supply, fill #0

## 2023-03-08 MED ORDER — LISDEXAMFETAMINE DIMESYLATE 50 MG PO CAPS
50.0000 mg | ORAL_CAPSULE | Freq: Every morning | ORAL | 0 refills | Status: DC
Start: 2023-03-08 — End: 2023-05-05
  Filled 2023-03-16 – 2023-03-25 (×3): qty 30, 30d supply, fill #0

## 2023-03-09 ENCOUNTER — Encounter: Payer: Self-pay | Admitting: Pharmacist

## 2023-03-09 ENCOUNTER — Other Ambulatory Visit: Payer: Self-pay

## 2023-03-12 ENCOUNTER — Other Ambulatory Visit: Payer: Self-pay

## 2023-03-16 ENCOUNTER — Other Ambulatory Visit (HOSPITAL_COMMUNITY): Payer: Self-pay

## 2023-03-16 ENCOUNTER — Other Ambulatory Visit: Payer: Self-pay

## 2023-03-18 ENCOUNTER — Other Ambulatory Visit (HOSPITAL_COMMUNITY): Payer: Self-pay

## 2023-03-25 ENCOUNTER — Other Ambulatory Visit (HOSPITAL_COMMUNITY): Payer: Self-pay

## 2023-03-25 ENCOUNTER — Other Ambulatory Visit: Payer: Self-pay

## 2023-03-26 ENCOUNTER — Other Ambulatory Visit (HOSPITAL_COMMUNITY): Payer: Self-pay

## 2023-03-26 ENCOUNTER — Encounter: Payer: Self-pay | Admitting: Hematology and Oncology

## 2023-03-29 ENCOUNTER — Other Ambulatory Visit (HOSPITAL_COMMUNITY): Payer: Self-pay

## 2023-03-29 ENCOUNTER — Other Ambulatory Visit: Payer: Self-pay

## 2023-03-29 MED ORDER — LO LOESTRIN FE 1 MG-10 MCG / 10 MCG PO TABS
1.0000 | ORAL_TABLET | Freq: Every day | ORAL | 0 refills | Status: DC
  Filled 2023-03-29: qty 84, 84d supply, fill #0

## 2023-04-19 ENCOUNTER — Other Ambulatory Visit (HOSPITAL_COMMUNITY): Payer: Self-pay

## 2023-05-05 ENCOUNTER — Other Ambulatory Visit (HOSPITAL_COMMUNITY): Payer: Self-pay

## 2023-05-05 MED ORDER — LISDEXAMFETAMINE DIMESYLATE 50 MG PO CAPS
50.0000 mg | ORAL_CAPSULE | Freq: Every morning | ORAL | 0 refills | Status: DC
Start: 2023-05-05 — End: 2024-03-21
  Filled 2023-05-05 – 2023-05-12 (×2): qty 30, 30d supply, fill #0

## 2023-05-06 ENCOUNTER — Encounter: Payer: Self-pay | Admitting: Pharmacist

## 2023-05-06 ENCOUNTER — Other Ambulatory Visit: Payer: Self-pay

## 2023-05-11 ENCOUNTER — Other Ambulatory Visit: Payer: Self-pay

## 2023-05-12 ENCOUNTER — Other Ambulatory Visit (HOSPITAL_COMMUNITY): Payer: Self-pay

## 2023-06-01 DIAGNOSIS — N950 Postmenopausal bleeding: Secondary | ICD-10-CM | POA: Diagnosis not present

## 2023-06-07 ENCOUNTER — Other Ambulatory Visit: Payer: Self-pay

## 2023-06-07 ENCOUNTER — Other Ambulatory Visit (HOSPITAL_COMMUNITY): Payer: Self-pay

## 2023-06-07 DIAGNOSIS — E539 Vitamin B deficiency, unspecified: Secondary | ICD-10-CM | POA: Diagnosis not present

## 2023-06-07 DIAGNOSIS — Z30011 Encounter for initial prescription of contraceptive pills: Secondary | ICD-10-CM | POA: Diagnosis not present

## 2023-06-07 DIAGNOSIS — E569 Vitamin deficiency, unspecified: Secondary | ICD-10-CM | POA: Diagnosis not present

## 2023-06-07 DIAGNOSIS — Z0000 Encounter for general adult medical examination without abnormal findings: Secondary | ICD-10-CM | POA: Diagnosis not present

## 2023-06-07 DIAGNOSIS — F419 Anxiety disorder, unspecified: Secondary | ICD-10-CM | POA: Diagnosis not present

## 2023-06-07 DIAGNOSIS — F50811 Binge eating disorder, moderate: Secondary | ICD-10-CM | POA: Diagnosis not present

## 2023-06-07 MED ORDER — LO LOESTRIN FE 1 MG-10 MCG / 10 MCG PO TABS
1.0000 | ORAL_TABLET | Freq: Every day | ORAL | 0 refills | Status: DC
  Filled 2023-06-07: qty 84, 84d supply, fill #0

## 2023-06-07 MED ORDER — LISDEXAMFETAMINE DIMESYLATE 60 MG PO CAPS
60.0000 mg | ORAL_CAPSULE | Freq: Every morning | ORAL | 0 refills | Status: DC
  Filled 2023-06-07 – 2023-06-15 (×2): qty 30, 30d supply, fill #0

## 2023-06-07 MED ORDER — ESCITALOPRAM OXALATE 5 MG PO TABS
5.0000 mg | ORAL_TABLET | Freq: Every day | ORAL | 1 refills | Status: DC
  Filled 2023-06-07 – 2023-06-15 (×2): qty 90, 90d supply, fill #0

## 2023-06-10 ENCOUNTER — Other Ambulatory Visit: Payer: Self-pay

## 2023-06-15 ENCOUNTER — Other Ambulatory Visit: Payer: Self-pay

## 2023-06-15 ENCOUNTER — Other Ambulatory Visit (HOSPITAL_COMMUNITY): Payer: Self-pay

## 2023-07-27 ENCOUNTER — Other Ambulatory Visit (HOSPITAL_COMMUNITY): Payer: Self-pay

## 2023-07-27 DIAGNOSIS — Z01419 Encounter for gynecological examination (general) (routine) without abnormal findings: Secondary | ICD-10-CM | POA: Diagnosis not present

## 2023-07-27 DIAGNOSIS — Z124 Encounter for screening for malignant neoplasm of cervix: Secondary | ICD-10-CM | POA: Diagnosis not present

## 2023-07-27 MED ORDER — LO LOESTRIN FE 1 MG-10 MCG / 10 MCG PO TABS
1.0000 | ORAL_TABLET | Freq: Every day | ORAL | 4 refills | Status: AC
  Filled 2023-08-25: qty 84, 84d supply, fill #0
  Filled 2023-11-02 – 2023-11-24 (×2): qty 84, 84d supply, fill #1
  Filled 2024-02-05 – 2024-02-07 (×2): qty 84, 84d supply, fill #2
  Filled 2024-04-22: qty 84, 84d supply, fill #3
  Filled 2024-07-04 – 2024-07-12 (×2): qty 84, 84d supply, fill #4

## 2023-07-28 ENCOUNTER — Other Ambulatory Visit: Payer: Self-pay | Admitting: Obstetrics and Gynecology

## 2023-07-28 ENCOUNTER — Other Ambulatory Visit (HOSPITAL_COMMUNITY): Payer: Self-pay

## 2023-07-28 DIAGNOSIS — E8941 Symptomatic postprocedural ovarian failure: Secondary | ICD-10-CM

## 2023-08-04 ENCOUNTER — Other Ambulatory Visit: Payer: Self-pay

## 2023-08-04 ENCOUNTER — Other Ambulatory Visit (HOSPITAL_COMMUNITY): Payer: Self-pay

## 2023-08-04 MED ORDER — LISDEXAMFETAMINE DIMESYLATE 60 MG PO CAPS
60.0000 mg | ORAL_CAPSULE | Freq: Every morning | ORAL | 0 refills | Status: DC
  Filled 2023-08-04: qty 30, 30d supply, fill #0

## 2023-08-05 ENCOUNTER — Encounter: Payer: Self-pay | Admitting: Hematology and Oncology

## 2023-08-12 ENCOUNTER — Ambulatory Visit
Admission: RE | Admit: 2023-08-12 | Discharge: 2023-08-12 | Disposition: A | Discharge status: Home / Self Care | Payer: Commercial Managed Care - PPO | Source: Ambulatory Visit | Attending: Obstetrics and Gynecology | Admitting: Obstetrics and Gynecology

## 2023-08-12 DIAGNOSIS — Z90722 Acquired absence of ovaries, bilateral: Secondary | ICD-10-CM | POA: Diagnosis not present

## 2023-08-12 DIAGNOSIS — E8941 Symptomatic postprocedural ovarian failure: Secondary | ICD-10-CM

## 2023-08-12 DIAGNOSIS — N958 Other specified menopausal and perimenopausal disorders: Secondary | ICD-10-CM | POA: Diagnosis not present

## 2023-08-25 ENCOUNTER — Other Ambulatory Visit (HOSPITAL_COMMUNITY): Payer: Self-pay

## 2023-08-25 ENCOUNTER — Other Ambulatory Visit: Payer: Self-pay

## 2023-09-22 ENCOUNTER — Encounter: Payer: Self-pay | Admitting: Genetic Counselor

## 2023-09-30 ENCOUNTER — Other Ambulatory Visit (HOSPITAL_COMMUNITY): Payer: Self-pay

## 2023-09-30 ENCOUNTER — Other Ambulatory Visit: Payer: Self-pay

## 2023-09-30 DIAGNOSIS — Z30011 Encounter for initial prescription of contraceptive pills: Secondary | ICD-10-CM | POA: Diagnosis not present

## 2023-09-30 DIAGNOSIS — F50811 Binge eating disorder, moderate: Secondary | ICD-10-CM | POA: Diagnosis not present

## 2023-09-30 DIAGNOSIS — F419 Anxiety disorder, unspecified: Secondary | ICD-10-CM | POA: Diagnosis not present

## 2023-09-30 DIAGNOSIS — E559 Vitamin D deficiency, unspecified: Secondary | ICD-10-CM | POA: Diagnosis not present

## 2023-09-30 DIAGNOSIS — D519 Vitamin B12 deficiency anemia, unspecified: Secondary | ICD-10-CM | POA: Diagnosis not present

## 2023-09-30 MED ORDER — LISDEXAMFETAMINE DIMESYLATE 60 MG PO CAPS
60.0000 mg | ORAL_CAPSULE | Freq: Every morning | ORAL | 0 refills | Status: DC
  Filled 2023-09-30: qty 30, 30d supply, fill #0

## 2023-09-30 MED ORDER — ESCITALOPRAM OXALATE 5 MG PO TABS
5.0000 mg | ORAL_TABLET | Freq: Every day | ORAL | 1 refills | Status: DC
Start: 2023-09-30 — End: 2024-03-21
  Filled 2023-09-30: qty 90, 90d supply, fill #0
  Filled 2023-12-31: qty 90, 90d supply, fill #1

## 2023-11-02 ENCOUNTER — Other Ambulatory Visit (HOSPITAL_COMMUNITY): Payer: Self-pay

## 2023-11-24 ENCOUNTER — Other Ambulatory Visit (HOSPITAL_COMMUNITY): Payer: Self-pay

## 2023-12-31 ENCOUNTER — Other Ambulatory Visit (HOSPITAL_COMMUNITY): Payer: Self-pay

## 2024-01-19 DIAGNOSIS — H524 Presbyopia: Secondary | ICD-10-CM | POA: Diagnosis not present

## 2024-02-05 ENCOUNTER — Encounter: Payer: Self-pay | Admitting: Hematology and Oncology

## 2024-02-05 ENCOUNTER — Other Ambulatory Visit (HOSPITAL_COMMUNITY): Payer: Self-pay

## 2024-02-14 ENCOUNTER — Telehealth: Payer: Self-pay | Admitting: Hematology and Oncology

## 2024-02-14 NOTE — Telephone Encounter (Signed)
 Robin Miles called in to re-schedule her appointment I provided Dr. Gara next available Robin Miles stated 8/14 did not work for her. She is scheduled to see Dr. Odean on 9/2.

## 2024-02-15 ENCOUNTER — Inpatient Hospital Stay: Payer: Commercial Managed Care - PPO | Admitting: Hematology and Oncology

## 2024-03-16 ENCOUNTER — Other Ambulatory Visit (HOSPITAL_COMMUNITY): Payer: Self-pay

## 2024-03-16 ENCOUNTER — Other Ambulatory Visit: Payer: Self-pay

## 2024-03-16 MED ORDER — LOSARTAN POTASSIUM 50 MG PO TABS
50.0000 mg | ORAL_TABLET | Freq: Every day | ORAL | 3 refills | Status: AC
  Filled 2024-03-16: qty 90, 90d supply, fill #0
  Filled 2024-06-18: qty 90, 90d supply, fill #1

## 2024-03-16 MED ORDER — ESCITALOPRAM OXALATE 5 MG PO TABS
5.0000 mg | ORAL_TABLET | Freq: Every day | ORAL | 3 refills | Status: AC
  Filled 2024-03-16: qty 90, 90d supply, fill #0
  Filled 2024-06-18: qty 90, 90d supply, fill #1

## 2024-03-21 ENCOUNTER — Inpatient Hospital Stay: Attending: Hematology and Oncology | Admitting: Hematology and Oncology

## 2024-03-21 VITALS — BP 113/63 | HR 69 | Temp 97.7°F | Resp 17 | Wt 119.8 lb

## 2024-03-21 DIAGNOSIS — Z9011 Acquired absence of right breast and nipple: Secondary | ICD-10-CM | POA: Diagnosis not present

## 2024-03-21 DIAGNOSIS — Z853 Personal history of malignant neoplasm of breast: Secondary | ICD-10-CM | POA: Insufficient documentation

## 2024-03-21 DIAGNOSIS — M170 Bilateral primary osteoarthritis of knee: Secondary | ICD-10-CM | POA: Insufficient documentation

## 2024-03-21 DIAGNOSIS — C50111 Malignant neoplasm of central portion of right female breast: Secondary | ICD-10-CM | POA: Diagnosis not present

## 2024-03-21 DIAGNOSIS — Z9221 Personal history of antineoplastic chemotherapy: Secondary | ICD-10-CM | POA: Insufficient documentation

## 2024-03-21 DIAGNOSIS — Z171 Estrogen receptor negative status [ER-]: Secondary | ICD-10-CM

## 2024-03-21 NOTE — Assessment & Plan Note (Signed)
 Right breast retroareolar mass 3.7 x 2.8 x 1.7 cm involving anterior two thirds of the breast, T2 N0 M0 stage II a clinical stage, ER 0%, appears 0%, HER-2 positive ratio 3.53, PALB 2 mutation,   Treatment summary: 1. Completed neo-adjuvant chemotherapy with TCH Perjeta  started 12/04/2014 and completed 03/20/2015   2. Right Mastectomy 04/17/15: Microscopic focus of DCIS 0.1 cm 0/2 LN Neg. No inv cancer. Path CR; Left Mastectomy: Neg   3. Completed Herceptin  maintenance 11/27/2015 4. Neratinib  started 03/31/2016-September 2018 -----------------------------------------------------------------------------------------------------------------------------------------------------------------  Arthritis in her knees and hips   Severe hot flashes   Currently on estrogen replacement therapy and she is much better.   Bone density 08/12/2023: T-score -0.9: Normal (10/27/16: T score -0.9)   Breast cancer surveillance: 1.  Breast MRI 09/09/2017: Bilateral retropectoral implants without abnormality, no role of routine imaging because she had bilateral mastectomies 2. Breast exam 03/21/2024 benign.     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  Her entire family felt the vertigo.  I suspect this is benign positional vertigo.   Return to clinic in one-year for follow-up

## 2024-03-21 NOTE — Progress Notes (Signed)
 Patient Care Team: Baird Comer GAILS, NP as PCP - General (Adult Health Nurse Practitioner) Odean Potts, MD as Consulting Physician (Hematology and Oncology) Leora Lenis, MD as Consulting Physician (Plastic Surgery) Shannon Agent, MD as Consulting Physician (Radiation Oncology) Skeet Juliene SAUNDERS, DO as Consulting Physician (Neurology)  DIAGNOSIS:  Encounter Diagnosis  Name Primary?   Malignant neoplasm of central portion of right breast in female, estrogen receptor negative (HCC) Yes    SUMMARY OF ONCOLOGIC HISTORY: Oncology History  Cancer of central portion of right female breast (HCC)  11/08/2014 Mammogram   Segmental group of heterogeneous calcifications. Calcifications span 2.8 cm anterior posterior x 1.9 cm transverse x 0.6 cm craniocaudal   11/14/2014 Initial Diagnosis   Right breast biopsy 8:30 position: Invasive ductal carcinoma with DCIS, grade 2, ER 0%, PR 0%, HER-2 positive ratio 3.53   11/20/2014 Breast MRI   Right breast: 3.7 x 2.8 x 1.7 cm area of non-mass enhancement and retroareolar right breast into the lower outer quadrant and lower inner quadrant extending into the nipple involving anterior two thirds of the breast, no lymph nodes   11/20/2014 Clinical Stage   Stage IIA: T2 N0   12/04/2014 - 11/27/2015 Neo-Adjuvant Chemotherapy   Taxotere , carboplatin , Herceptin , Perjeta  6 cycles followed by Herceptin  maintenance for 1 year   12/19/2014 Procedure   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   04/17/2015 Surgery   Right Mastectomy: Microscopic focus of DCIS 0.1 cm 0/2 LN Neg. No invasive cancer. Path CR;  Left Mastectomy: Neg   04/17/2015 Pathologic Stage   ypTis ypN0   07/04/2015 Surgery   Bilateral salpingo-oophorectomy   01/28/2016 Survivorship   SCP visit completed   03/31/2016 - 03/31/2017 Chemotherapy   Neratinib       CHIEF COMPLIANT: Surveillance of breast cancer  HISTORY OF PRESENT ILLNESS:  History of Present  Illness Robin Miles is a 48 year old female with a history of malignant neoplasm of the central portion of the right breast who presents for a follow-up in hematology and medical oncology.  She experiences occasional burning sensations at the site of her scar without associated chest pain. Her medication list primarily consists of supplements.     ALLERGIES:  is allergic to compazine  [prochlorperazine ] and synvisc [hylan g-f 20].  MEDICATIONS:  Current Outpatient Medications  Medication Sig Dispense Refill   Cholecalciferol (VITAMIN D ) 10 MCG/ML LIQD      ergocalciferol  (VITAMIN D2) 1.25 MG (50000 UT) capsule Take 1 capsule (50,000 Units total) by mouth once a week.     escitalopram  (LEXAPRO ) 5 MG tablet TAKE 1 TABLET BY MOUTH ONCE DAILY 90 tablet 0   losartan  (COZAAR ) 50 MG tablet Take 1 tablet (50 mg total) by mouth daily. 90 tablet 3   Multiple Vitamin (MULTI VITAMIN DAILY PO)      naproxen  (NAPROSYN ) 500 MG tablet Take 1 tablet (500 mg total) by mouth 2 (two) times daily as needed. 20 tablet 5   Naproxen  Sodium (ALEVE ) 220 MG CAPS      Norethindrone -Ethinyl Estradiol -Fe Biphas (LO LOESTRIN FE ) 1 MG-10 MCG / 10 MCG tablet Take 1 tablet by mouth daily. 84 tablet 4   Omega 3 340 MG CPDR      vitamin B-12 (CYANOCOBALAMIN) 1000 MCG tablet Take 1 tablet (1,000 mcg total) by mouth daily.     escitalopram  (LEXAPRO ) 5 MG tablet Take 1 tablet (5 mg total) by mouth daily. 90 tablet 3   No current facility-administered medications for this  visit.    PHYSICAL EXAMINATION: ECOG PERFORMANCE STATUS: 1 - Symptomatic but completely ambulatory  Vitals:   03/21/24 1127  BP: 113/63  Pulse: 69  Resp: 17  Temp: 97.7 F (36.5 C)  SpO2: 100%   Filed Weights   03/21/24 1127  Weight: 119 lb 12.8 oz (54.3 kg)    Physical Exam No palpable lumps or nodules of bilateral reconstructed breasts or axilla     (exam performed in the presence of a chaperone)  LABORATORY DATA:  I have  reviewed the data as listed    Latest Ref Rng & Units 08/31/2019   10:52 AM 12/29/2016    9:30 AM 10/06/2016   10:18 AM  CMP  Glucose 70 - 99 mg/dL 97  899  95   BUN 6 - 23 mg/dL 13  79.9  82.0   Creatinine 0.40 - 1.20 mg/dL 8.97  1.1  0.9   Sodium 135 - 145 mEq/L 140  141  141   Potassium 3.5 - 5.1 mEq/L 4.1  4.0  4.3   Chloride 96 - 112 mEq/L 104     CO2 19 - 32 mEq/L 28  25  24    Calcium  8.4 - 10.5 mg/dL 9.8  9.7  9.7   Total Protein 6.0 - 8.3 g/dL 7.0  6.3  6.4   Total Bilirubin 0.2 - 1.2 mg/dL 0.9  9.37  9.32   Alkaline Phos 39 - 117 U/L 50  65  65   AST 0 - 37 U/L 16  18  18    ALT 0 - 35 U/L 15  13  17      Lab Results  Component Value Date   WBC 6.6 08/31/2019   HGB 15.1 (H) 08/31/2019   HCT 44.8 08/31/2019   MCV 88.1 08/31/2019   PLT 302.0 08/31/2019   NEUTROABS 3.8 08/31/2019    ASSESSMENT & PLAN:  Cancer of central portion of right female breast Right breast retroareolar mass 3.7 x 2.8 x 1.7 cm involving anterior two thirds of the breast, T2 N0 M0 stage II a clinical stage, ER 0%, appears 0%, HER-2 positive ratio 3.53, PALB 2 mutation,   Treatment summary: 1. Completed neo-adjuvant chemotherapy with TCH Perjeta  started 12/04/2014 and completed 03/20/2015   2. Right Mastectomy 04/17/15: Microscopic focus of DCIS 0.1 cm 0/2 LN Neg. No inv cancer. Path CR; Left Mastectomy: Neg   3. Completed Herceptin  maintenance 11/27/2015 4. Neratinib  started 03/31/2016-September 2018 -----------------------------------------------------------------------------------------------------------------------------------------------------------------  Arthritis in her knees and hips   Severe hot flashes   Currently on estrogen replacement therapy and she is much better.   Bone density 08/12/2023: T-score -0.9: Normal (10/27/16: T score -0.9)   Breast cancer surveillance: 1.  Breast MRI 09/09/2017: Bilateral retropectoral implants without abnormality, no role of routine imaging because she had  bilateral mastectomies 2. Breast exam 03/21/2024 benign.   Recommend guardant reveal for MRD monitoring    She works for NICU at [employer]      Return to clinic in one-year for follow-up     No orders of the defined types were placed in this encounter.  The patient has a good understanding of the overall plan. she agrees with it. she will call with any problems that may develop before the next visit here. Total time spent: 30 mins including face to face time and time spent for planning, charting and co-ordination of care   Naomi MARLA Chad, MD 03/21/24

## 2024-03-22 ENCOUNTER — Telehealth: Payer: Self-pay

## 2024-03-22 NOTE — Telephone Encounter (Signed)
 Per md orders entered for Guardant Reveal and all supported documents faxed to [phone]. Faxed confirmation was received.

## 2024-03-27 DIAGNOSIS — H524 Presbyopia: Secondary | ICD-10-CM | POA: Diagnosis not present

## 2024-03-27 DIAGNOSIS — T1511XA Foreign body in conjunctival sac, right eye, initial encounter: Secondary | ICD-10-CM | POA: Diagnosis not present

## 2024-04-23 ENCOUNTER — Other Ambulatory Visit (HOSPITAL_COMMUNITY): Payer: Self-pay

## 2024-04-27 ENCOUNTER — Encounter: Payer: Self-pay | Admitting: Hematology and Oncology

## 2024-06-19 ENCOUNTER — Other Ambulatory Visit (HOSPITAL_COMMUNITY): Payer: Self-pay

## 2024-07-04 ENCOUNTER — Other Ambulatory Visit (HOSPITAL_COMMUNITY): Payer: Self-pay

## 2024-07-12 ENCOUNTER — Other Ambulatory Visit (HOSPITAL_COMMUNITY): Payer: Self-pay

## 2025-03-21 ENCOUNTER — Ambulatory Visit: Admitting: Hematology and Oncology
